# Patient Record
Sex: Female | Born: 1975 | Race: Black or African American | Hispanic: No | Smoking: Never smoker
Health system: Southern US, Community
[De-identification: ages and names within clinical notes are randomized; demographics above are authoritative.]

## PROBLEM LIST (undated history)

## (undated) DIAGNOSIS — K219 Gastro-esophageal reflux disease without esophagitis: Secondary | ICD-10-CM

## (undated) DIAGNOSIS — G935 Compression of brain: Secondary | ICD-10-CM

## (undated) HISTORY — PX: HERNIA REPAIR: SHX51

## (undated) HISTORY — PX: OTHER SURGICAL HISTORY: SHX169

---

## 2001-05-20 ENCOUNTER — Encounter: Payer: Self-pay | Admitting: Internal Medicine

## 2001-05-20 ENCOUNTER — Emergency Department (HOSPITAL_COMMUNITY): Admission: EM | Admit: 2001-05-20 | Discharge: 2001-05-20 | Payer: Self-pay | Admitting: Emergency Medicine

## 2002-01-06 ENCOUNTER — Encounter: Payer: Self-pay | Admitting: Internal Medicine

## 2002-01-06 ENCOUNTER — Ambulatory Visit (HOSPITAL_COMMUNITY): Admission: RE | Admit: 2002-01-06 | Discharge: 2002-01-06 | Payer: Self-pay | Admitting: Internal Medicine

## 2002-07-04 ENCOUNTER — Emergency Department (HOSPITAL_COMMUNITY): Admission: EM | Admit: 2002-07-04 | Discharge: 2002-07-04 | Payer: Self-pay | Admitting: *Deleted

## 2003-03-14 ENCOUNTER — Encounter: Payer: Self-pay | Admitting: *Deleted

## 2003-03-14 ENCOUNTER — Emergency Department (HOSPITAL_COMMUNITY): Admission: EM | Admit: 2003-03-14 | Discharge: 2003-03-14 | Payer: Self-pay | Admitting: *Deleted

## 2003-08-09 ENCOUNTER — Ambulatory Visit (HOSPITAL_COMMUNITY): Admission: RE | Admit: 2003-08-09 | Discharge: 2003-08-09 | Payer: Self-pay | Admitting: Internal Medicine

## 2003-08-09 ENCOUNTER — Encounter: Payer: Self-pay | Admitting: Internal Medicine

## 2004-05-07 ENCOUNTER — Emergency Department (HOSPITAL_COMMUNITY): Admission: EM | Admit: 2004-05-07 | Discharge: 2004-05-07 | Payer: Self-pay | Admitting: Emergency Medicine

## 2004-07-14 ENCOUNTER — Emergency Department (HOSPITAL_COMMUNITY): Admission: EM | Admit: 2004-07-14 | Discharge: 2004-07-14 | Payer: Self-pay | Admitting: Emergency Medicine

## 2005-03-17 ENCOUNTER — Emergency Department (HOSPITAL_COMMUNITY): Admission: EM | Admit: 2005-03-17 | Discharge: 2005-03-17 | Payer: Self-pay | Admitting: Emergency Medicine

## 2005-05-13 ENCOUNTER — Emergency Department (HOSPITAL_COMMUNITY): Admission: EM | Admit: 2005-05-13 | Discharge: 2005-05-13 | Payer: Self-pay | Admitting: Emergency Medicine

## 2011-11-06 ENCOUNTER — Emergency Department (HOSPITAL_COMMUNITY)
Admission: EM | Admit: 2011-11-06 | Discharge: 2011-11-06 | Disposition: A | Discharge status: Home / Self Care | Payer: Medicaid Other | Attending: Emergency Medicine | Admitting: Emergency Medicine

## 2011-11-06 ENCOUNTER — Other Ambulatory Visit: Payer: Self-pay

## 2011-11-06 ENCOUNTER — Emergency Department (HOSPITAL_COMMUNITY): Payer: Medicaid Other

## 2011-11-06 DIAGNOSIS — M4120 Other idiopathic scoliosis, site unspecified: Secondary | ICD-10-CM | POA: Insufficient documentation

## 2011-11-06 DIAGNOSIS — R071 Chest pain on breathing: Secondary | ICD-10-CM | POA: Insufficient documentation

## 2011-11-06 DIAGNOSIS — M62838 Other muscle spasm: Secondary | ICD-10-CM | POA: Insufficient documentation

## 2011-11-06 MED ORDER — CYCLOBENZAPRINE HCL 10 MG PO TABS: 10.0000 mg | ORAL_TABLET | Freq: Two times a day (BID) | ORAL | Status: AC | PRN

## 2011-11-06 MED ORDER — OXYCODONE-ACETAMINOPHEN 5-325 MG PO TABS
1.0000 | ORAL_TABLET | Freq: Once | ORAL | Status: AC
  Administered 2011-11-06: 1 via ORAL

## 2011-11-06 MED ORDER — OXYCODONE-ACETAMINOPHEN 5-325 MG PO TABS
1.0000 | ORAL_TABLET | Freq: Four times a day (QID) | ORAL | Status: AC | PRN
Start: 2011-11-06 — End: 2011-11-16

## 2011-11-06 NOTE — ED Provider Notes (Signed)
History     CSN: [id number] Arrival date & time: 11/06/2011 12:16 PM   First MD Initiated Contact with Patient 11/06/11 1305      Chief Complaint  Patient presents with  . Chest Pain    (Consider location/radiation/quality/duration/timing/severity/associated sxs/prior treatment) Patient is a 35 y.o. female presenting with chest pain. The history is provided by the patient.  Chest Pain The chest pain began yesterday. Chest pain occurs constantly. The chest pain is worsening. Associated with: worse with movement. At its most intense, the pain is at 10/10. The pain is currently at 10/10. The quality of the pain is described as pleuritic, sharp and stabbing. The pain radiates to the left shoulder and left arm (pain worsened by moving the left arm). Chest pain is worsened by certain positions. Pertinent negatives for primary symptoms include no fever, no shortness of breath, no cough, no palpitations, no abdominal pain, no nausea and no vomiting.  Pertinent negatives for associated symptoms include no numbness and no weakness. She tried NSAIDs for the symptoms. Risk factors include no known risk factors.     History reviewed. No pertinent past medical history.  History reviewed. No pertinent past surgical history.  No family history on file.  History  Substance Use Topics  . Smoking status: Not on file  . Smokeless tobacco: Not on file  . Alcohol Use: No    OB History    Grav Para Term Preterm Abortions TAB SAB Ect Mult Living                  Review of Systems  Constitutional: Negative for fever.  Respiratory: Negative for cough and shortness of breath.   Cardiovascular: Positive for chest pain. Negative for palpitations.  Gastrointestinal: Negative for nausea, vomiting and abdominal pain.  Neurological: Negative for weakness and numbness.  All other systems reviewed and are negative.    Allergies  Review of patient's allergies indicates no known allergies.  Home  Medications   Current Outpatient Rx  Name Route Sig Dispense Refill  . ACETAMINOPHEN 325 MG PO TABS Oral Take 650 mg by mouth every 6 (six) hours as needed. pain     . IBUPROFEN 200 MG PO TABS Oral Take 200 mg by mouth every 6 (six) hours as needed. For pain      BP 131/73  Pulse 74  Temp(Src) 97.8 F (36.6 C) (Oral)  Resp 16  Ht 5' 9.5" (1.765 m)  Wt 238 lb (107.956 kg)  BMI 34.64 kg/m2  SpO2 98%  LMP 10/30/2011  Physical Exam  Nursing note and vitals reviewed. Constitutional: She is oriented to person, place, and time. She appears well-developed and well-nourished. No distress.  HENT:  Head: Normocephalic and atraumatic.  Eyes: EOM are normal. Pupils are equal, round, and reactive to light.  Cardiovascular: Normal rate, regular rhythm, normal heart sounds and intact distal pulses.  Exam reveals no friction rub.   No murmur heard. Pulmonary/Chest: Effort normal and breath sounds normal. She has no wheezes. She has no rales. She exhibits tenderness.       Tenderness with muscle spasm over the left chest wall, left trapezius and left deltoid  Abdominal: Soft. Bowel sounds are normal. She exhibits no distension. There is no tenderness. There is no rebound and no guarding.  Musculoskeletal: Normal range of motion. She exhibits no tenderness.       Right shoulder: Normal.       Left shoulder: She exhibits tenderness and spasm.  No edema, strong pulses distally in upper ext  Neurological: She is alert and oriented to person, place, and time. No cranial nerve deficit.  Skin: Skin is warm and dry. No rash noted.  Psychiatric: She has a normal mood and affect. Her behavior is normal.    ED Course  Procedures (including critical care time)  Labs Reviewed - No data to display Dg Chest 2 View  11/06/2011  *RADIOLOGY REPORT*  Clinical Data: Left shoulder and chest pain  CHEST - 2 VIEW  Comparison: 05/13/2005  Findings: Biconvex thoracolumbar scoliosis. Upper-normal size of  cardiac silhouette. Mediastinal contours and pulmonary vascularity normal. Minimal chronic peribronchial thickening. No pulmonary infiltrate, pleural effusion or pneumothorax. No acute osseous findings.  IMPRESSION: Minimal chronic bronchitic changes. Scoliosis.  Original Report Authenticated By: Lollie Marrow, M.D.     No diagnosis found.   Date: 11/06/2011  Rate: 74  Rhythm: normal sinus rhythm  QRS Axis: normal  Intervals: normal  ST/T Wave abnormalities: normal  Conduction Disutrbances:none  Narrative Interpretation:   Old EKG Reviewed: none available    MDM   Pt with chest wall pain with pain worse with palpation and movement of the left arm.  No SOB, Cough, fever or neuro deficit.  Pt well appearing and normal VS.  Pt can move her arm but states it is so painful she does not want to move it.  EKG wnl.  Will get CXR, however PERC neg and do not feel that this is cardiac and no risk factors. Will treat pain and recheck.  3:00 PM Pt feeling better after pain meds and CXR with scoliosis but o/w wnl but may be the cause of the pain.  Will treat symptomatically.       Gwyneth Sprout, MD 11/06/11 1504

## 2011-11-06 NOTE — ED Notes (Signed)
Pt complain of pain in left chest. States she cannot move her left arm

## 2018-04-08 DIAGNOSIS — M170 Bilateral primary osteoarthritis of knee: Secondary | ICD-10-CM | POA: Insufficient documentation

## 2018-04-08 DIAGNOSIS — M5410 Radiculopathy, site unspecified: Secondary | ICD-10-CM | POA: Insufficient documentation

## 2020-02-13 ENCOUNTER — Other Ambulatory Visit: Payer: Self-pay | Admitting: Family Medicine

## 2020-02-13 DIAGNOSIS — M25552 Pain in left hip: Secondary | ICD-10-CM

## 2020-02-16 ENCOUNTER — Ambulatory Visit (HOSPITAL_COMMUNITY): Payer: Self-pay

## 2020-02-22 ENCOUNTER — Ambulatory Visit (HOSPITAL_COMMUNITY)
Admission: RE | Admit: 2020-02-22 | Discharge: 2020-02-22 | Disposition: A | Discharge status: Home / Self Care | Payer: BC Managed Care – PPO | Source: Ambulatory Visit | Attending: Family Medicine | Admitting: Family Medicine

## 2020-02-22 ENCOUNTER — Other Ambulatory Visit: Payer: Self-pay

## 2020-02-22 DIAGNOSIS — M25552 Pain in left hip: Secondary | ICD-10-CM | POA: Diagnosis not present

## 2020-02-28 ENCOUNTER — Ambulatory Visit: Payer: BC Managed Care – PPO | Attending: Internal Medicine

## 2020-02-28 DIAGNOSIS — Z23 Encounter for immunization: Secondary | ICD-10-CM | POA: Insufficient documentation

## 2020-02-28 NOTE — Progress Notes (Signed)
   Covid-19 Vaccination Clinic  Name:  Terri Wiley    MRN: [id number] DOB: August 15, 1976  02/28/2020  Terri Wiley was observed post Covid-19 immunization for 15 minutes without incident. She was provided with Vaccine Information Sheet and instruction to access the V-Safe system.   Terri Wiley was instructed to call 911 with any severe reactions post vaccine: Marland Kitchen Difficulty breathing  . Swelling of face and throat  . A fast heartbeat  . A bad rash all over body  . Dizziness and weakness   Immunizations Administered    Name Date Dose VIS Date Route   Moderna COVID-19 Vaccine 02/28/2020  3:25 AM 0.5 mL 11/29/2019 Intramuscular   Manufacturer: Moderna   Lot: [id number]   [place]: [id number]

## 2020-03-27 ENCOUNTER — Ambulatory Visit: Payer: BC Managed Care – PPO | Attending: Internal Medicine

## 2020-03-27 DIAGNOSIS — Z23 Encounter for immunization: Secondary | ICD-10-CM

## 2020-03-27 NOTE — Progress Notes (Signed)
   Covid-19 Vaccination Clinic  Name:  Terri Wiley    MRN: [id number] DOB: 14-Dec-1976  03/27/2020  [name] was observed post Covid-19 immunization for 15 minutes without incident. She was provided with Vaccine Information Sheet and instruction to access the V-Safe system.   [name] was instructed to call 911 with any severe reactions post vaccine: Marland Kitchen Difficulty breathing  . Swelling of face and throat  . A fast heartbeat  . A bad rash all over body  . Dizziness and weakness   Immunizations Administered    Name Date Dose VIS Date Route   Moderna COVID-19 Vaccine 03/27/2020  2:40 PM 0.5 mL 11/29/2019 Intramuscular   Manufacturer: Moderna   Lot: [id number]   [place]: [id number]

## 2020-10-02 ENCOUNTER — Other Ambulatory Visit: Payer: Self-pay

## 2020-10-02 ENCOUNTER — Emergency Department (HOSPITAL_COMMUNITY): Payer: BC Managed Care – PPO

## 2020-10-02 ENCOUNTER — Encounter (HOSPITAL_COMMUNITY): Payer: Self-pay | Admitting: Emergency Medicine

## 2020-10-02 ENCOUNTER — Emergency Department (HOSPITAL_COMMUNITY)
Admission: EM | Admit: 2020-10-02 | Discharge: 2020-10-02 | Disposition: A | Discharge status: Home / Self Care | Payer: BC Managed Care – PPO | Attending: Emergency Medicine | Admitting: Emergency Medicine

## 2020-10-02 DIAGNOSIS — K219 Gastro-esophageal reflux disease without esophagitis: Secondary | ICD-10-CM | POA: Insufficient documentation

## 2020-10-02 DIAGNOSIS — D649 Anemia, unspecified: Secondary | ICD-10-CM | POA: Diagnosis not present

## 2020-10-02 DIAGNOSIS — M549 Dorsalgia, unspecified: Secondary | ICD-10-CM | POA: Insufficient documentation

## 2020-10-02 DIAGNOSIS — M25519 Pain in unspecified shoulder: Secondary | ICD-10-CM | POA: Insufficient documentation

## 2020-10-02 DIAGNOSIS — Z7982 Long term (current) use of aspirin: Secondary | ICD-10-CM | POA: Insufficient documentation

## 2020-10-02 DIAGNOSIS — R202 Paresthesia of skin: Secondary | ICD-10-CM | POA: Insufficient documentation

## 2020-10-02 DIAGNOSIS — R079 Chest pain, unspecified: Secondary | ICD-10-CM

## 2020-10-02 DIAGNOSIS — R200 Anesthesia of skin: Secondary | ICD-10-CM

## 2020-10-02 HISTORY — DX: Compression of brain: G93.5

## 2020-10-02 HISTORY — DX: Gastro-esophageal reflux disease without esophagitis: K21.9

## 2020-10-02 LAB — CBC WITH DIFFERENTIAL/PLATELET
Abs Immature Granulocytes: 0.01 10*3/uL (ref 0.00–0.07)
Basophils Absolute: 0 10*3/uL (ref 0.0–0.1)
Basophils Relative: 0 %
Eosinophils Absolute: 0.1 10*3/uL (ref 0.0–0.5)
Eosinophils Relative: 1 %
HCT: 30.5 % — ABNORMAL LOW (ref 36.0–46.0)
Hemoglobin: 10 g/dL — ABNORMAL LOW (ref 12.0–15.0)
Immature Granulocytes: 0 %
Lymphocytes Relative: 31 %
Lymphs Abs: 1.9 10*3/uL (ref 0.7–4.0)
MCH: 28.9 pg (ref 26.0–34.0)
MCHC: 32.8 g/dL (ref 30.0–36.0)
MCV: 88.2 fL (ref 80.0–100.0)
Monocytes Absolute: 0.4 10*3/uL (ref 0.1–1.0)
Monocytes Relative: 7 %
Neutro Abs: 3.7 10*3/uL (ref 1.7–7.7)
Neutrophils Relative %: 61 %
Platelets: 251 10*3/uL (ref 150–400)
RBC: 3.46 MIL/uL — ABNORMAL LOW (ref 3.87–5.11)
RDW: 14.2 % (ref 11.5–15.5)
WBC: 6.2 10*3/uL (ref 4.0–10.5)
nRBC: 0 % (ref 0.0–0.2)

## 2020-10-02 LAB — COMPREHENSIVE METABOLIC PANEL
ALT: 14 U/L (ref 0–44)
AST: 13 U/L — ABNORMAL LOW (ref 15–41)
Albumin: 3.1 g/dL — ABNORMAL LOW (ref 3.5–5.0)
Alkaline Phosphatase: 67 U/L (ref 38–126)
Anion gap: 6 (ref 5–15)
BUN: 11 mg/dL (ref 6–20)
CO2: 26 mmol/L (ref 22–32)
Calcium: 8.5 mg/dL — ABNORMAL LOW (ref 8.9–10.3)
Chloride: 108 mmol/L (ref 98–111)
Creatinine, Ser: 0.7 mg/dL (ref 0.44–1.00)
GFR calc non Af Amer: >60 mL/min (ref >60)
Glucose, Bld: 90 mg/dL (ref 70–99)
Potassium: 3.4 mmol/L — ABNORMAL LOW (ref 3.5–5.1)
Sodium: 140 mmol/L (ref 135–145)
Total Bilirubin: 0.3 mg/dL (ref 0.3–1.2)
Total Protein: 6.7 g/dL (ref 6.5–8.1)

## 2020-10-02 LAB — LIPASE, BLOOD: Lipase: 31 U/L (ref 11–51)

## 2020-10-02 LAB — D-DIMER, QUANTITATIVE: D-Dimer, Quant: 1.56 ug/mL-FEU — ABNORMAL HIGH (ref 0.00–0.50)

## 2020-10-02 LAB — TROPONIN I (HIGH SENSITIVITY): Troponin I (High Sensitivity): <2 ng/L (ref <18)

## 2020-10-02 MED ORDER — IOHEXOL 350 MG/ML SOLN
100.0000 mL | Freq: Once | INTRAVENOUS | Status: AC | PRN
  Administered 2020-10-02: 100 mL via INTRAVENOUS

## 2020-10-02 MED ORDER — PREDNISONE 10 MG PO TABS: 20.0000 mg | ORAL_TABLET | Freq: Every day | ORAL | 0 refills | Status: DC

## 2020-10-02 MED ORDER — DEXAMETHASONE SODIUM PHOSPHATE 10 MG/ML IJ SOLN
10.0000 mg | Freq: Once | INTRAMUSCULAR | Status: AC
  Administered 2020-10-02: 10 mg via INTRAVENOUS
  Filled 2020-10-02: qty 1

## 2020-10-02 MED ORDER — ASPIRIN 81 MG PO CHEW
324.0000 mg | CHEWABLE_TABLET | Freq: Once | ORAL | Status: AC
  Administered 2020-10-02: 324 mg via ORAL
  Filled 2020-10-02: qty 4

## 2020-10-02 NOTE — ED Triage Notes (Signed)
Pt has chest pain that began last night with nausea. Pt states the pain is in her left chest and radiates to her left shoulder.

## 2020-10-02 NOTE — ED Provider Notes (Signed)
[facility] EMERGENCY DEPARTMENT Provider Note   CSN: [id number] Arrival date & time: 10/02/20  1631     History Chief Complaint  Patient presents with  . Chest Pain    Terri Wiley is a 44 y.o. female.  HPI   This very pleasant 44 year old female with a history of acid reflux presents with a complaint of some chest pain on the left side associated with pain going up into the arm and the shoulder and the neck, this gets worse with movement of the arm, it is persistent, feels like she has a bubble in her chest, she tried to drink some soda to make herself belch but this did not work. She does have a grandmother who recently had a blood clot in her lungs but has never had risk factors herself, she does not smoke, no recent travel trauma immobilization or surgery and she does not have a history of cancer or estrogen use. She has no shortness of breath, she has been coughing occasionally, she has no swelling of the legs. The symptoms started last night but been present the majority of the day. She feels like she has some numbness in her left hand and forearm, it does not extend above the elbow  Past Medical History:  Diagnosis Date  . GERD (gastroesophageal reflux disease)   . Hernia cerebri ([place])     There are no problems to display for this patient.   Past Surgical History:  Procedure Laterality Date  . tubiligation       OB History   No obstetric history on file.     No family history on file.  Social History   Tobacco Use  . Smoking status: Never Smoker  . Smokeless tobacco: Never Used  Vaping Use  . Vaping Use: Never used  Substance Use Topics  . Alcohol use: No  . Drug use: No    Home Medications Prior to Admission medications   Medication Sig Start Date End Date Taking? Authorizing Provider  acetaminophen (TYLENOL) 325 MG tablet Take 650 mg by mouth every 6 (six) hours as needed. pain    Yes [provider]  aspirin 81 MG EC tablet Take 81 mg by  mouth daily. Swallow whole.   Yes [provider]  ibuprofen (ADVIL,MOTRIN) 200 MG tablet Take 200 mg by mouth every 6 (six) hours as needed. For pain   Yes [provider]  predniSONE (DELTASONE) 10 MG tablet Take 2 tablets (20 mg total) by mouth daily. 10/02/20   Noemi Chapel, MD    Allergies    Bee venom and Penicillins  Review of Systems   Review of Systems  All other systems reviewed and are negative.   Physical Exam Updated Vital Signs BP (!) 126/94   Pulse 71   Temp 99.2 F (37.3 C) (Oral)   Resp (!) 22   Ht 1.753 m (5' 9" )   Wt 132.9 kg   LMP 09/23/2020 (Exact Date)   SpO2 96%   BMI 43.27 kg/m   Physical Exam Vitals and nursing note reviewed.  Constitutional:      General: She is not in acute distress.    Appearance: She is well-developed.  HENT:     Head: Normocephalic and atraumatic.     Mouth/Throat:     Pharynx: No oropharyngeal exudate.  Eyes:     General: No scleral icterus.       Right eye: No discharge.        Left eye: No  discharge.     Conjunctiva/sclera: Conjunctivae normal.     Pupils: Pupils are equal, round, and reactive to light.  Neck:     Thyroid: No thyromegaly.     Vascular: No JVD.  Cardiovascular:     Rate and Rhythm: Normal rate and regular rhythm.     Heart sounds: Normal heart sounds. No murmur heard.  No friction rub. No gallop.      Comments: Occasional PVCs Pulmonary:     Effort: Pulmonary effort is normal. No respiratory distress.     Breath sounds: Normal breath sounds. No wheezing or rales.  Abdominal:     General: Bowel sounds are normal. There is no distension.     Palpations: Abdomen is soft. There is no mass.     Tenderness: There is no abdominal tenderness.  Musculoskeletal:        General: No tenderness. Normal range of motion.     Cervical back: Normal range of motion and neck supple.  Lymphadenopathy:     Cervical: No cervical adenopathy.  Skin:    General: Skin is warm and dry.      Findings: No erythema or rash.  Neurological:     Mental Status: She is alert.     Coordination: Coordination normal.     Comments: Normal strength in all 4 extremities, normal finger-nose-finger bilaterally, no pronator drift. She has decreased sensation to light touch and pinprick from the elbow to the fingertips on the left. There is no decrease sensation in the legs and no weakness in the legs. There is no facial droop, cranial nerves III through XII are normal  Psychiatric:        Behavior: Behavior normal.     ED Results / Procedures / Treatments   Labs (all labs ordered are listed, but only abnormal results are displayed) Labs Reviewed  CBC WITH DIFFERENTIAL/PLATELET - Abnormal; Notable for the following components:      Result Value   RBC 3.46 (*)    Hemoglobin 10.0 (*)    HCT 30.5 (*)    All other components within normal limits  COMPREHENSIVE METABOLIC PANEL - Abnormal; Notable for the following components:   Potassium 3.4 (*)    Calcium 8.5 (*)    Albumin 3.1 (*)    AST 13 (*)    All other components within normal limits  D-DIMER, QUANTITATIVE (NOT AT [facility]) - Abnormal; Notable for the following components:   D-Dimer, Quant 1.56 (*)    All other components within normal limits  LIPASE, BLOOD  TROPONIN I (HIGH SENSITIVITY)    EKG EKG Interpretation  Date/Time:  Tuesday October 02 2020 16:39:29 EDT Ventricular Rate:  76 PR Interval:  164 QRS Duration: 82 QT Interval:  410 QTC Calculation: 461 R Axis:   61 Text Interpretation: Sinus rhythm with frequent Premature ventricular complexes Low voltage QRS Borderline ECG Since last tracing ectopy now present Confirmed by Noemi Chapel [phone]) on 10/02/2020 5:04:12 PM   Radiology DG Chest 2 View  Result Date: 10/02/2020 CLINICAL DATA:  Chest pain and nausea since last evening. EXAM: CHEST - 2 VIEW COMPARISON:  11/06/2011 FINDINGS: The heart is upper limits of normal in size. Mild tortuosity of the thoracic aorta. A  small to moderate-sized hiatal hernia is noted. The lungs are clear. No infiltrates or effusions. No pulmonary lesions. The bony thorax is intact. Stable thoracolumbar scoliosis. IMPRESSION: No active cardiopulmonary disease. Electronically Signed   By: Marijo Sanes M.D.   On: 10/02/2020 17:52  CT Angio Chest PE W and/or Wo Contrast  Result Date: 10/02/2020 CLINICAL DATA:  Chest pain left chest pain EXAM: CT ANGIOGRAPHY CHEST WITH CONTRAST TECHNIQUE: Multidetector CT imaging of the chest was performed using the standard protocol during bolus administration of intravenous contrast. Multiplanar CT image reconstructions and MIPs were obtained to evaluate the vascular anatomy. CONTRAST:  162m OMNIPAQUE IOHEXOL 350 MG/ML SOLN COMPARISON:  Chest x-ray 10/02/2020, CT chest 07/14/2004 FINDINGS: Cardiovascular: Satisfactory opacification of the pulmonary arteries to the segmental level. No evidence of pulmonary embolism. Borderline to mild cardiomegaly. No pericardial effusion. Nonaneurysmal aorta. No dissection is seen. Mediastinum/Nodes: No enlarged mediastinal, hilar, or axillary lymph nodes. Thyroid gland, trachea, and esophagus demonstrate no significant findings. Lungs/Pleura: Lungs are clear. No pleural effusion or pneumothorax. Upper Abdomen: Moderate hiatal hernia. Probable parapelvic cyst in the right kidney. Musculoskeletal: No chest wall abnormality. No acute or significant osseous findings. Review of the MIP images confirms the above findings. IMPRESSION: 1. Negative for acute pulmonary embolus or aortic dissection. Clear lung fields. 2. Moderate hiatal hernia. Electronically Signed   By: KDonavan FoilM.D.   On: 10/02/2020 19:59    Procedures Procedures (including critical care time)  Medications Ordered in ED Medications  aspirin chewable tablet 324 mg (324 mg Oral Given 10/02/20 1804)  dexamethasone (DECADRON) injection 10 mg (10 mg Intravenous Given 10/02/20 1806)  iohexol (OMNIPAQUE) 350  MG/ML injection 100 mL (100 mLs Intravenous Contrast Given 10/02/20 1939)    ED Course  I have reviewed the triage vital signs and the nursing notes.  Pertinent labs & imaging results that were available during my care of the patient were reviewed by me and considered in my medical decision making (see chart for details).    MDM Rules/Calculators/A&P                          The cause of the patient's chest pain could be multifactorial however the patient has very low risk for cardiac disease and pulmonary embolism. She has recently had a family member with a PE thus we will add a D-dimer, she is not tachycardic or hypoxic and her lung sounds are clear, order a chest x-ray. We will check a troponin, a single value should be adequate since this started last night. She could potentially have some acid reflux type symptoms however the numbness in her arm with the neck pain could suggest a radiculopathy and thus Decadron will be ordered. There is no muscle weakness in that arm whatsoever. No other nerve deficits to suggest that this is a stroke  The patient has had a negative CT scan for dissection or pulmonary embolism, her chest pain with neurologic symptoms is more likely to be related to a pinched nerve that it is to be related to any of the more serious complicating mechanisms such as dissection or PE.  Patient is stable for discharge, labs are otherwise unremarkable, mild anemia - pt made aware for follow up  Final Clinical Impression(s) / ED Diagnoses Final diagnoses:  Chest pain, unspecified type  Arm numbness  Anemia, unspecified type    Rx / DC Orders ED Discharge Orders         Ordered    predniSONE (DELTASONE) 10 MG tablet  Daily        10/02/20 2012           MNoemi Chapel MD 10/02/20 2015

## 2020-10-02 NOTE — Discharge Instructions (Addendum)
Your testing has been totally normal with no signs of heart attack, no signs of blood clot or any other significant abnormalities.  You do have some anemia - low blood counts which needs to be rechecked in a month at your family doctor office).  You may have a pinched nerve that is causing your pain so I want you to take prednisone daily for the next 5 days.  Please follow-up with your family doctor for further testing if this continues but come back to the ER for severe or worsening symptoms

## 2021-02-06 ENCOUNTER — Other Ambulatory Visit (HOSPITAL_COMMUNITY): Payer: Self-pay | Admitting: Family Medicine

## 2021-02-13 ENCOUNTER — Ambulatory Visit (HOSPITAL_COMMUNITY)
Admission: RE | Admit: 2021-02-13 | Discharge: 2021-02-13 | Disposition: A | Discharge status: Home / Self Care | Payer: BC Managed Care – PPO | Source: Ambulatory Visit | Attending: Family Medicine | Admitting: Family Medicine

## 2021-02-13 ENCOUNTER — Other Ambulatory Visit: Payer: Self-pay

## 2021-02-13 ENCOUNTER — Other Ambulatory Visit (HOSPITAL_COMMUNITY): Payer: Self-pay | Admitting: Family Medicine

## 2021-02-13 DIAGNOSIS — M549 Dorsalgia, unspecified: Secondary | ICD-10-CM | POA: Insufficient documentation

## 2021-02-13 NOTE — Progress Notes (Signed)
CARDIOLOGY CONSULT NOTE       Patient ID: Terri Wiley MRN: [id number] DOB/AGE: Jan 29, 1976 45 y.o.  Referring Physician: Karie Kirks Primary Physician: Leslie Andrea, MD Primary Cardiologist: New Reason for Consultation: Chest Pain   Active Problems:   * No active hospital problems. *   HPI:  45 y.o. referred by Dr Karie Kirks for chest pain History of GERD Seen in ED 10/02/20 with left sided chest pain going up arm, shoulder and neck Pain worse with movement of left arm Also with  Gas felt like she needed to belch Symptoms for 24 hours There was some numbness in her left arm CT negative for PE/Dissection Troponin negative and no acute ECG changes Also noted to be anemic with Hb 10 normal MCV Concern for radiculopathy and given some steroids. She had f/u plain films of spine 02/13/21 normal  Has thoracolumbar scoliosis  Pain started in October. Atypical not always exertional sharp on both sided of chest Can occur 1-2/x week or qow. 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 Has 5 kids 3 at home Youngest just went to boot camp at [employer] in [place]. She has hiatal hernia with indigestion   She had a 1/2 niece who died waiting for heart transplant for weak heart muscle   ROS All other systems reviewed and negative except as noted above  Past Medical History:  Diagnosis Date  . GERD (gastroesophageal reflux disease)   . Hernia cerebri ([place])     Family History  Problem Relation Age of Onset  . Liver disease Mother   . Hypertension Mother   . Diabetes Mother   . Aneurysm Father   . Hypertension Father     Social History   Socioeconomic History  . Marital status: Legally Separated    Spouse name: Not on file  . Number of children: Not on file  . Years of education: Not on file  . Highest education level: Not on file  Occupational History  . Not on file  Tobacco Use  . Smoking status: Never Smoker  . Smokeless tobacco: Never Used  Vaping Use  .  Vaping Use: Never used  Substance and Sexual Activity  . Alcohol use: No  . Drug use: No  . Sexual activity: Not on file  Other Topics Concern  . Not on file  Social History Narrative  . Not on file   Social Determinants of Health   Financial Resource Strain: Not on file  Food Insecurity: Not on file  Transportation Needs: Not on file  Physical Activity: Not on file  Stress: Not on file  Social Connections: Not on file  Intimate Partner Violence: Not on file    Past Surgical History:  Procedure Laterality Date  . HERNIA REPAIR    . tubiligation        Current Outpatient Medications:  .  acetaminophen (TYLENOL) 325 MG tablet, Take 650 mg by mouth every 6 (six) hours as needed. pain, Disp: , Rfl:  .  aspirin 81 MG EC tablet, Take 81 mg by mouth daily. Swallow whole., Disp: , Rfl:  .  cefdinir (OMNICEF) 300 MG capsule, Take 300 mg by mouth 2 (two) times daily., Disp: , Rfl:  .  cetirizine (ZYRTEC) 10 MG tablet, Take 10 mg by mouth daily., Disp: , Rfl:  .  FEROSUL 325 (65 Fe) MG tablet, Take 325 mg by mouth daily., Disp: , Rfl:  .  ibuprofen (ADVIL,MOTRIN) 200 MG tablet, Take 200 mg by mouth every 6 (  six) hours as needed. For pain, Disp: , Rfl:  .  LORazepam (ATIVAN) 0.5 MG tablet, Take 0.5 mg by mouth daily as needed., Disp: , Rfl:     Physical Exam: Blood pressure (!) 142/80, pulse 99, height 5' 9.5" (1.765 m), weight 124.5 kg, SpO2 96 %.    Affect appropriate Healthy:  appears stated age 47: normal Neck supple with no adenopathy JVP normal no bruits no thyromegaly Lungs clear with no wheezing and good diaphragmatic motion Heart:  S1/S2 no murmur, no rub, gallop or click PMI normal Abdomen: benighn, BS positve, no tenderness, no AAA no bruit.  No HSM or HJR Distal pulses intact with no bruits No edema Neuro non-focal Skin warm and dry No muscular weakness   Labs:   Lab Results  Component Value Date   WBC 6.2 10/02/2020   HGB 10.0 (L) 10/02/2020   HCT  30.5 (L) 10/02/2020   MCV 88.2 10/02/2020   PLT 251 10/02/2020   Radiology: DG Lumbar Spine Complete  Result Date: 02/15/2021 CLINICAL DATA:  Pain EXAM: LUMBAR SPINE - COMPLETE 4+ VIEW COMPARISON:  April 21, 2018 FINDINGS: There are five non-rib bearing lumbar-type vertebral bodies. No spondylolisthesis. Partial visualization of dextrocurvature centered at the thoracolumbar junction. There is no evidence for acute fracture or subluxation. Mild multilevel endplate proliferative changes with relative preservation of the disc spaces. Mild lower lumbar facet arthropathy. Visualized abdomen is unremarkable. IMPRESSION: 1. No acute osseous abnormality in the lumbar spine. Electronically Signed   By: Valentino Saxon MD   On: 02/15/2021 16:06   DG SCOLIOSIS EVAL COMPLETE SPINE 1 VIEW  Result Date: 02/15/2021 CLINICAL DATA:  Pain EXAM: DG SCOLIOSIS EVAL COMPLETE SPINE 1V COMPARISON:  October 02, 2028 FINDINGS: There are 12 rib-bearing thoracic type vertebral bodies and 5 non rib-bearing lumbar type vertebral bodies. There is levocurvature the superior thoracic spine with a Cobb angle of 26 degrees as measured from the superior endplate of T1 to the superior endplate of T7. There is dextrocurvature the thoracolumbar spine with a Cobb angle of 28 degrees as measured from the superior endplate of T7 to the superior endplate of L3. Revisualization of a moderate hiatal hernia. Unchanged cardiomediastinal silhouette. No acute osseous abnormality. IMPRESSION: 1. Thoracolumbar scoliosis as described above. Electronically Signed   By: Valentino Saxon MD   On: 02/15/2021 16:04    EKG: NSR PVC nonspecific ST changes    ASSESSMENT AND PLAN:   1. Chest pain: atypical no acute ECG changes troponin negative after 24 hours symptoms Given nonspecific ECG changes and ambient PVCls favor exercise myovue and echo to further evaluate 2. Anemia:  F/u primary not microcytic  3. Arm pain Lumbar/spine films done 02/13/21  normal scoliosis likely contributes to pains   Ex Myovue Echo  F/U PRN if normal   Signed: Jenkins Rouge 02/18/2021, 9:37 AM

## 2021-02-18 ENCOUNTER — Encounter: Payer: Self-pay | Admitting: Cardiovascular Disease

## 2021-02-18 ENCOUNTER — Ambulatory Visit: Payer: BC Managed Care – PPO | Admitting: Cardiovascular Disease

## 2021-02-18 ENCOUNTER — Encounter: Payer: Self-pay | Admitting: *Deleted

## 2021-02-18 ENCOUNTER — Other Ambulatory Visit: Payer: Self-pay

## 2021-02-18 VITALS — BP 142/80 | HR 99 | Ht 69.5 in | Wt 274.4 lb

## 2021-02-18 DIAGNOSIS — R079 Chest pain, unspecified: Secondary | ICD-10-CM | POA: Diagnosis not present

## 2021-02-18 DIAGNOSIS — I493 Ventricular premature depolarization: Secondary | ICD-10-CM

## 2021-02-18 DIAGNOSIS — R9431 Abnormal electrocardiogram [ECG] [EKG]: Secondary | ICD-10-CM | POA: Diagnosis not present

## 2021-02-18 NOTE — Addendum Note (Signed)
Addended by: Christella Scheuermann C on: 02/18/2021 10:16 AM   Modules accepted: Orders

## 2021-02-18 NOTE — Patient Instructions (Signed)
Medication Instructions:  Your physician recommends that you continue on your current medications as directed. Please refer to the Current Medication list given to you today.  *If you need a refill on your cardiac medications before your next appointment, please call your pharmacy*   Lab Work: NONE   If you have labs (blood work) drawn today and your tests are completely normal, you will receive your results only by: Marland Kitchen MyChart Message (if you have MyChart) OR . A paper copy in the mail If you have any lab test that is abnormal or we need to change your treatment, we will call you to review the results.   Testing/Procedures: Your physician has requested that you have an echocardiogram. Echocardiography is a painless test that uses sound waves to create images of your heart. It provides your doctor with information about the size and shape of your heart and how well your heart's chambers and valves are working. This procedure takes approximately one hour. There are no restrictions for this procedure.  Your physician has requested that you have en exercise stress myoview. For further information please visit [email or web address]. Please follow instruction sheet, as given.  Follow-Up: At [facility], you and your health needs are our priority.  As part of our continuing mission to provide you with exceptional heart care, we have created designated Provider Care Teams.  These Care Teams include your primary Cardiologist (physician) and Advanced Practice Providers (APPs -  Physician Assistants and Nurse Practitioners) who all work together to provide you with the care you need, when you need it.  We recommend signing up for the patient portal called "MyChart".  Sign up information is provided on this After Visit Summary.  MyChart is used to connect with patients for Virtual Visits (Telemedicine).  Patients are able to view lab/test results, encounter notes, upcoming appointments, etc.  Non-urgent  messages can be sent to your provider as well.   To learn more about what you can do with MyChart, go to [email or web address].    Your next appointment:    As Needed   The format for your next appointment:   In Person  Provider:   Jenkins Rouge, MD   Other Instructions Thank you for choosing [facility]!

## 2021-02-28 ENCOUNTER — Ambulatory Visit: Payer: BC Managed Care – PPO | Admitting: General Surgery

## 2021-02-28 ENCOUNTER — Encounter: Payer: Self-pay | Admitting: General Surgery

## 2021-02-28 ENCOUNTER — Other Ambulatory Visit: Payer: Self-pay

## 2021-02-28 VITALS — BP 112/71 | HR 66 | Temp 97.8°F | Resp 14 | Ht 69.5 in | Wt 273.0 lb

## 2021-02-28 DIAGNOSIS — M25552 Pain in left hip: Secondary | ICD-10-CM

## 2021-02-28 DIAGNOSIS — G8929 Other chronic pain: Secondary | ICD-10-CM | POA: Insufficient documentation

## 2021-02-28 DIAGNOSIS — R2242 Localized swelling, mass and lump, left lower limb: Secondary | ICD-10-CM | POA: Diagnosis not present

## 2021-02-28 NOTE — Progress Notes (Signed)
[facility] History and Physical  Reason for Referral: Left hip mass? Lipoma  Referring Physician:  Leslie Andrea, MD   Chief Complaint    Lipoma left thigh      Terri Wiley is a 45 y.o. female.  HPI: 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 She has left hip pain and notes a mass in the left hip. The pain is worse with her activity and driving.   She has been seen by her PCP who questions a lipoma. She has had prior US that did not demonstrate any mass and felt that the hips were symmetrical with regards to fatty tissue.   She continues to have this pain. She says the pain is at the hip and down the side. She has never been evaluated for any bursitis or sciatica she says.   Past Medical History:  Diagnosis Date  . GERD (gastroesophageal reflux disease)   . Hernia cerebri [facility])     Past Surgical History:  Procedure Laterality Date  . HERNIA REPAIR    . tubiligation      Family History  Problem Relation Age of Onset  . Liver disease Mother   . Hypertension Mother   . Diabetes Mother   . Aneurysm Father   . Hypertension Father     Social History   Tobacco Use  . Smoking status: Never Smoker  . Smokeless tobacco: Never Used  Vaping Use  . Vaping Use: Never used  Substance Use Topics  . Alcohol use: No  . Drug use: No    Medications: I have reviewed the patient's current medications. Allergies as of 02/28/2021      Reactions   Bee Venom Shortness Of Breath   Penicillins Hives      Medication List       Accurate as of February 28, 2021  4:54 PM. If you have any questions, ask your nurse or doctor.        acetaminophen 325 MG tablet Commonly known as: TYLENOL Take 650 mg by mouth every 6 (six) hours as needed. pain   aspirin 81 MG EC tablet Take 81 mg by mouth daily. Swallow whole.   cefdinir 300 MG capsule Commonly known as: OMNICEF Take 300 mg by mouth 2 (two) times daily.    cetirizine 10 MG tablet Commonly known as: ZYRTEC Take 10 mg by mouth daily.   FeroSul 325 (65 FE) MG tablet Generic drug: ferrous sulfate Take 325 mg by mouth daily.   ibuprofen 200 MG tablet Commonly known as: ADVIL Take 200 mg by mouth every 6 (six) hours as needed. For pain   LORazepam 0.5 MG tablet Commonly known as: ATIVAN Take 0.5 mg by mouth daily as needed.        ROS:  A comprehensive review of systems was negative except for: Cardiovascular: positive for chest pain Gastrointestinal: positive for abdominal pain Musculoskeletal: positive for back pain, neck pain and joint pain, scoliosis  Neurological: positive for numbness in bilateral lower extremities  Blood pressure 112/71, pulse 66, temperature 97.8 F (36.6 C), temperature source Other (Comment), resp. rate 14, height 5' 9.5" (1.765 m), weight 273 lb (123.8 kg), SpO2 98 %. Physical Exam Vitals reviewed.  Constitutional:      Appearance: She is obese.  HENT:     Head: Normocephalic.     Nose: Nose normal.  Eyes:     Extraocular Movements: Extraocular movements intact.  Cardiovascular:     Rate and Rhythm: Normal rate.  Pulmonary:     Effort: Pulmonary effort is normal.  Abdominal:     General: There is no distension.     Palpations: Abdomen is soft.     Tenderness: There is no abdominal tenderness.  Musculoskeletal:        General: No swelling.     Cervical back: No rigidity.     Comments: Left hip with fatty protrusion under the trochanter area, tender around hip, similar fatty protrusion on right but without tenderness   Skin:    General: Skin is warm.  Neurological:     General: No focal deficit present.     Mental Status: She is alert and oriented to person, place, and time.  Psychiatric:        Mood and Affect: Mood normal.        Behavior: Behavior normal.        Thought Content: Thought content normal.     Results: CLINICAL DATA:  Pain  EXAM: DG SCOLIOSIS EVAL COMPLETE SPINE  1V  COMPARISON:  October 02, 2028  FINDINGS: There are 12 rib-bearing thoracic type vertebral bodies and 5 non rib-bearing lumbar type vertebral bodies. There is levocurvature the superior thoracic spine with a Cobb angle of 26 degrees as measured from the superior endplate of T1 to the superior endplate of T7. There is dextrocurvature the thoracolumbar spine with a Cobb angle of 28 degrees as measured from the superior endplate of T7 to the superior endplate of L3. Revisualization of a moderate hiatal hernia. Unchanged cardiomediastinal silhouette. No acute osseous abnormality.  IMPRESSION: 1. Thoracolumbar scoliosis as described above.   Electronically Signed   By: Valentino Saxon MD   On: 02/15/2021 16:04  CLINICAL DATA:  Left hip pain.  Lump over left hip.  EXAM: ULTRASOUND LEFT LOWER EXTREMITY LIMITED  TECHNIQUE: Ultrasound examination of the lower extremity soft tissues was performed in the area of clinical concern.  COMPARISON:  CT report 05/20/2021.  FINDINGS: No cystic or solid abnormalities identified. Left hip soft tissues are symmetric with right side.  IMPRESSION: No abnormality identified. No cystic or solid abnormalities identified.   Electronically Signed   By: Marcello Moores  Register   On: 02/23/2020 06:07  Assessment & Plan:  [name] is a 45 y.o. female with chronic left hip pain and concern she has a lipoma. I do not see any overt differences in the left and right hip. She is more tender on the left for sure and uses this for driving. I am not what is causing her pain. Possibly something related to the hip versus spine given her scoliosis.   - Discussed ordering CT to look at the hip better and ensure no lipoma in the area. If there is signs of a lipoma or asymmetry we can discuss options of excision.  - Will order CT pelvis and see if insurance will cover - Will call with results    All questions were answered to the satisfaction  of the patient.   Virl Cagey 02/28/2021, 4:54 PM

## 2021-02-28 NOTE — Patient Instructions (Signed)
Will get CT and call with results.   Hip Pain The hip is the joint between the upper legs and the lower pelvis. The bones, cartilage, tendons, and muscles of your hip joint support your body and allow you to move around. Hip pain can range from a minor ache to severe pain in one or both of your hips. The pain may be felt on the inside of the hip joint near the groin, or on the outside near the buttocks and upper thigh. You may also have swelling or stiffness in your hip area. Follow these instructions at home: Managing pain, stiffness, and swelling  If directed, put ice on the painful area. To do this: ? Put ice in a plastic bag. ? Place a towel between your skin and the bag. ? Leave the ice on for 20 minutes, 2-3 times a day.  If directed, apply heat to the affected area as often as told by your health care provider. Use the heat source that your health care provider recommends, such as a moist heat pack or a heating pad. ? Place a towel between your skin and the heat source. ? Leave the heat on for 20-30 minutes. ? Remove the heat if your skin turns bright red. This is especially important if you are unable to feel pain, heat, or cold. You may have a greater risk of getting burned.      Activity  Do exercises as told by your health care provider.  Avoid activities that cause pain. General instructions  Take over-the-counter and prescription medicines only as told by your health care provider.  Keep a journal of your symptoms. Write down: ? How often you have hip pain. ? The location of your pain. ? What the pain feels like. ? What makes the pain worse.  Sleep with a pillow between your legs on your most comfortable side.  Keep all follow-up visits as told by your health care provider. This is important.   Contact a health care provider if:  You cannot put weight on your leg.  Your pain or swelling continues or gets worse after one week.  It gets harder to walk.  You  have a fever. Get help right away if:  You fall.  You have a sudden increase in pain and swelling in your hip.  Your hip is red or swollen or very tender to touch. Summary  Hip pain can range from a minor ache to severe pain in one or both of your hips.  The pain may be felt on the inside of the hip joint near the groin, or on the outside near the buttocks and upper thigh.  Avoid activities that cause pain.  Write down how often you have hip pain, the location of the pain, what makes it worse, and what it feels like. This information is not intended to replace advice given to you by your health care provider. Make sure you discuss any questions you have with your health care provider. Document Revised: 05/02/2019 Document Reviewed: 05/02/2019 Elsevier Patient Education  [place].

## 2021-03-13 ENCOUNTER — Other Ambulatory Visit (HOSPITAL_COMMUNITY)
Admission: RE | Admit: 2021-03-13 | Discharge: 2021-03-13 | Disposition: A | Discharge status: Home / Self Care | Payer: BC Managed Care – PPO | Source: Ambulatory Visit | Attending: Cardiovascular Disease | Admitting: Cardiovascular Disease

## 2021-03-13 ENCOUNTER — Other Ambulatory Visit: Payer: Self-pay

## 2021-03-13 DIAGNOSIS — Z20822 Contact with and (suspected) exposure to covid-19: Secondary | ICD-10-CM | POA: Diagnosis not present

## 2021-03-13 DIAGNOSIS — Z01812 Encounter for preprocedural laboratory examination: Secondary | ICD-10-CM | POA: Insufficient documentation

## 2021-03-13 LAB — SARS CORONAVIRUS 2 (TAT 6-24 HRS): SARS Coronavirus 2: NEGATIVE

## 2021-03-15 ENCOUNTER — Encounter (HOSPITAL_COMMUNITY)
Admission: RE | Admit: 2021-03-15 | Discharge: 2021-03-15 | Disposition: A | Discharge status: Home / Self Care | Payer: BC Managed Care – PPO | Source: Ambulatory Visit | Attending: Cardiovascular Disease | Admitting: Cardiovascular Disease

## 2021-03-15 ENCOUNTER — Encounter (HOSPITAL_COMMUNITY): Payer: Self-pay

## 2021-03-15 ENCOUNTER — Ambulatory Visit (HOSPITAL_BASED_OUTPATIENT_CLINIC_OR_DEPARTMENT_OTHER)
Admission: RE | Admit: 2021-03-15 | Discharge: 2021-03-15 | Disposition: A | Discharge status: Home / Self Care | Payer: BC Managed Care – PPO | Source: Ambulatory Visit | Attending: Cardiovascular Disease | Admitting: Cardiovascular Disease

## 2021-03-15 ENCOUNTER — Other Ambulatory Visit (HOSPITAL_COMMUNITY): Payer: BC Managed Care – PPO

## 2021-03-15 ENCOUNTER — Other Ambulatory Visit: Payer: Self-pay

## 2021-03-15 DIAGNOSIS — R9431 Abnormal electrocardiogram [ECG] [EKG]: Secondary | ICD-10-CM | POA: Insufficient documentation

## 2021-03-15 DIAGNOSIS — R079 Chest pain, unspecified: Secondary | ICD-10-CM

## 2021-03-15 LAB — NM MYOCAR MULTI W/SPECT W/WALL MOTION / EF
Estimated workload: 7 METS
Exercise duration (min): 5 min
Exercise duration (sec): 42 s
LV dias vol: 87 mL (ref 46–106)
LV sys vol: 36 mL
MPHR: 174 {beats}/min
Peak HR: 142 {beats}/min
Percent HR: 81 %
RATE: 0.45
RPE: 15
Rest HR: 66 {beats}/min
SDS: 1
SRS: 0
SSS: 1
TID: 0.85

## 2021-03-15 LAB — ECHOCARDIOGRAM COMPLETE: S' Lateral: 3.2 cm

## 2021-03-15 MED ORDER — SODIUM CHLORIDE FLUSH 0.9 % IV SOLN
INTRAVENOUS | Status: AC
  Administered 2021-03-15: 10 mL via INTRAVENOUS
  Filled 2021-03-15: qty 10

## 2021-03-15 MED ORDER — REGADENOSON 0.4 MG/5ML IV SOLN
INTRAVENOUS | Status: AC
  Administered 2021-03-15: 0.4 mg via INTRAVENOUS
  Filled 2021-03-15: qty 5

## 2021-03-15 MED ORDER — TECHNETIUM TC 99M TETROFOSMIN IV KIT
10.0000 | PACK | Freq: Once | INTRAVENOUS | Status: AC | PRN
  Administered 2021-03-15: 11 via INTRAVENOUS

## 2021-03-15 MED ORDER — TECHNETIUM TC 99M TETROFOSMIN IV KIT
30.0000 | PACK | Freq: Once | INTRAVENOUS | Status: AC | PRN
  Administered 2021-03-15: 32.5 via INTRAVENOUS

## 2021-03-15 NOTE — Progress Notes (Signed)
  Echocardiogram 2D Echocardiogram has been performed.  Terri Wiley 03/15/2021, 12:42 PM

## 2021-04-01 NOTE — Progress Notes (Incomplete)
CARDIOLOGY CONSULT NOTE       Patient ID: Terri Wiley MRN: [id number] DOB/AGE: 04-Jul-1976 45 y.o.  Referring Physician: Karie Kirks Primary Physician: Leslie Andrea, MD Primary Cardiologist: New Reason for Consultation: Chest Pain   Active Problems:   * No active hospital problems. *   HPI:  45 y.o. referred by Dr Karie Kirks 02/19/21 for chest pain History of GERD Seen in ED 10/02/20 with left sided chest pain going up arm, shoulder and neck Pain worse with movement of left arm Also with  Gas felt like she needed to belch Symptoms for 24 hours There was some numbness in her left arm CT negative for PE/Dissection Troponin negative and no acute ECG changes Also noted to be anemic with Hb 10 normal MCV Concern for radiculopathy and given some steroids. She had f/u plain films of spine 02/13/21 normal  Has thoracolumbar scoliosis  Pain started in October. Atypical not always exertional sharp on both sided of chest Can occur 1-2/x week or qow. 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 Has 5 kids 3 at home Youngest just went to boot camp at [employer] in [place]. She has hiatal hernia with indigestion   She had a 1/2 niece who died waiting for heart transplant for weak heart muscle   TTE 03/15/21 Normal EF 55-60% Myovue 03/15/21 normal no ischemia/infarct EF 58%  She has chronic left hip pain seen by surgery ordered CT to assess Known thoracolumbar scoliosis  ***  ROS All other systems reviewed and negative except as noted above  Past Medical History:  Diagnosis Date  . GERD (gastroesophageal reflux disease)   . Hernia cerebri ([place])     Family History  Problem Relation Age of Onset  . Liver disease Mother   . Hypertension Mother   . Diabetes Mother   . Aneurysm Father   . Hypertension Father     Social History   Socioeconomic History  . Marital status: Legally Separated    Spouse name: Not on file  . Number of children: Not on file  . Years of education:  Not on file  . Highest education level: Not on file  Occupational History  . Not on file  Tobacco Use  . Smoking status: Never Smoker  . Smokeless tobacco: Never Used  Vaping Use  . Vaping Use: Never used  Substance and Sexual Activity  . Alcohol use: No  . Drug use: No  . Sexual activity: Not on file  Other Topics Concern  . Not on file  Social History Narrative  . Not on file   Social Determinants of Health   Financial Resource Strain: Not on file  Food Insecurity: Not on file  Transportation Needs: Not on file  Physical Activity: Not on file  Stress: Not on file  Social Connections: Not on file  Intimate Partner Violence: Not on file    Past Surgical History:  Procedure Laterality Date  . HERNIA REPAIR    . tubiligation        Current Outpatient Medications:  .  acetaminophen (TYLENOL) 325 MG tablet, Take 650 mg by mouth every 6 (six) hours as needed. pain, Disp: , Rfl:  .  aspirin 81 MG EC tablet, Take 81 mg by mouth daily. Swallow whole., Disp: , Rfl:  .  cefdinir (OMNICEF) 300 MG capsule, Take 300 mg by mouth 2 (two) times daily., Disp: , Rfl:  .  cetirizine (ZYRTEC) 10 MG tablet, Take 10 mg by mouth daily., Disp: , Rfl:  .  FEROSUL 325 (65 Fe) MG tablet, Take 325 mg by mouth daily., Disp: , Rfl:  .  ibuprofen (ADVIL,MOTRIN) 200 MG tablet, Take 200 mg by mouth every 6 (six) hours as needed. For pain, Disp: , Rfl:  .  LORazepam (ATIVAN) 0.5 MG tablet, Take 0.5 mg by mouth daily as needed., Disp: , Rfl:     Physical Exam: There were no vitals taken for this visit.    Affect appropriate Healthy:  appears stated age 19: normal Neck supple with no adenopathy JVP normal no bruits no thyromegaly Lungs clear with no wheezing and good diaphragmatic motion Heart:  S1/S2 no murmur, no rub, gallop or click PMI normal Abdomen: benighn, BS positve, no tenderness, no AAA no bruit.  No HSM or HJR Distal pulses intact with no bruits No edema Neuro non-focal Skin  warm and dry No muscular weakness   Labs:   Lab Results  Component Value Date   WBC 6.2 10/02/2020   HGB 10.0 (L) 10/02/2020   HCT 30.5 (L) 10/02/2020   MCV 88.2 10/02/2020   PLT 251 10/02/2020   Radiology: NM Myocar Multi W/Spect W/Wall Motion / EF  Result Date: 03/15/2021  Treadmill attempted but patient unable to achieve adequate heart rate response and there was substantial lead motion artifact making interpretation impossible. Study therefore switched to Lexiscan infusion. No diagnostic ST segment changes were noted. There were occasional to frequent PVCs noted in recovery including couplets. These are upright in the inferior leads suggesting possible outflow tract origin. No sustained arrhythmias.  No significant myocardial perfusion defects to indicate scar or ischemia.  This is a low risk study.  Nuclear stress EF: 58%.    ECHOCARDIOGRAM COMPLETE  Result Date: 03/15/2021    ECHOCARDIOGRAM REPORT   Patient Name:   Terri Wiley Date of Exam: 03/15/2021 Medical Rec #:  [id number]    Height:       69.5 in Accession #:    [id number]   Weight:       273.0 lb Date of Birth:  [birth year]    BSA:          2.370 m Patient Age:    62 years     BP:           112/71 mmHg Patient Gender: F            HR:           76 bpm. Exam Location:  [facility] Procedure: 2D Echo, Color Doppler and Cardiac Doppler Indications:    R94.31 Abnormal EKG  History:        Patient has no prior history of Echocardiogram examinations.  Sonographer:    Bernadene Person RDCS Referring Phys: [place]  1. Left ventricular ejection fraction, by estimation, is 55 to 60%. The left ventricle has normal function. The left ventricle has no regional wall motion abnormalities. Left ventricular diastolic parameters are indeterminate.  2. Right ventricular systolic function is normal. The right ventricular size is normal. There is normal pulmonary artery systolic pressure. The estimated right ventricular systolic  pressure is 73.7 mmHg.  3. A small pericardial effusion is present. The pericardial effusion is posterior to the left ventricle.  4. The mitral valve is grossly normal. Trivial mitral valve regurgitation.  5. The aortic valve is tricuspid. Aortic valve regurgitation is not visualized.  6. The inferior vena cava is normal in size with greater than 50% respiratory variability, suggesting right atrial pressure of 3 mmHg. FINDINGS  Left Ventricle: Left ventricular ejection fraction, by estimation, is 55 to 60%. The left ventricle has normal function. The left ventricle has no regional wall motion abnormalities. The left ventricular internal cavity size was normal in size. There is  no left ventricular hypertrophy. Left ventricular diastolic parameters are indeterminate. Right Ventricle: The right ventricular size is normal. No increase in right ventricular wall thickness. Right ventricular systolic function is normal. There is normal pulmonary artery systolic pressure. The tricuspid regurgitant velocity is 2.03 m/s, and  with an assumed right atrial pressure of 3 mmHg, the estimated right ventricular systolic pressure is 36.6 mmHg. Left Atrium: Left atrial size was normal in size. Right Atrium: Right atrial size was normal in size. Pericardium: A small pericardial effusion is present. The pericardial effusion is posterior to the left ventricle. Mitral Valve: The mitral valve is grossly normal. Trivial mitral valve regurgitation. Tricuspid Valve: The tricuspid valve is grossly normal. Tricuspid valve regurgitation is trivial. Aortic Valve: The aortic valve is tricuspid. Aortic valve regurgitation is not visualized. Pulmonic Valve: The pulmonic valve was grossly normal. Pulmonic valve regurgitation is trivial. Aorta: The aortic root is normal in size and structure. Venous: The inferior vena cava is normal in size with greater than 50% respiratory variability, suggesting right atrial pressure of 3 mmHg. IAS/Shunts: No  atrial level shunt detected by color flow Doppler.  LEFT VENTRICLE PLAX 2D LVIDd:         4.80 cm LVIDs:         3.20 cm LV PW:         1.00 cm LV IVS:        0.90 cm LVOT diam:     2.10 cm LV SV:         90 LV SV Index:   38 LVOT Area:     3.46 cm  RIGHT VENTRICLE RV S prime:     9.39 cm/s TAPSE (M-mode): 1.9 cm LEFT ATRIUM             Index       RIGHT ATRIUM           Index LA diam:        3.50 cm 1.48 cm/m  RA Area:     13.60 cm LA Vol (A2C):   42.4 ml 17.89 ml/m RA Volume:   28.40 ml  11.98 ml/m LA Vol (A4C):   46.5 ml 19.62 ml/m LA Biplane Vol: 46.5 ml 19.62 ml/m  AORTIC VALVE LVOT Vmax:   105.62 cm/s LVOT Vmean:  72.625 cm/s LVOT VTI:    0.261 m  AORTA Ao Root diam: 3.00 cm Ao Asc diam:  3.20 cm TRICUSPID VALVE TR Peak grad:   16.5 mmHg TR Vmax:        203.00 cm/s  SHUNTS Systemic VTI:  0.26 m Systemic Diam: 2.10 cm Rozann Lesches MD Electronically signed by Rozann Lesches MD Signature Date/Time: 03/15/2021/4:35:05 PM    Final     EKG: NSR PVC nonspecific ST changes    ASSESSMENT AND PLAN:   1. Chest pain: atypical no acute ECG changes troponin negative after 24 hours symptoms Myovue normal no ischemia and echo with no structural hear disease observe  2. Anemia:  F/u primary not microcytic  3. Arm pain Lumbar/spine films done 02/13/21 normal -scoliosis likely contributes to pains  4. Hip Pain: still pending CT scan hip ordered by general surgery   F/U cardiology PRN     Signed: Jenkins Rouge 04/01/2021, 1:52 PM

## 2021-04-03 ENCOUNTER — Ambulatory Visit (HOSPITAL_COMMUNITY)
Admission: RE | Admit: 2021-04-03 | Discharge: 2021-04-03 | Disposition: A | Discharge status: Home / Self Care | Payer: BC Managed Care – PPO | Source: Ambulatory Visit | Attending: General Surgery | Admitting: General Surgery

## 2021-04-03 DIAGNOSIS — G8929 Other chronic pain: Secondary | ICD-10-CM | POA: Diagnosis present

## 2021-04-03 DIAGNOSIS — R2242 Localized swelling, mass and lump, left lower limb: Secondary | ICD-10-CM | POA: Diagnosis present

## 2021-04-03 DIAGNOSIS — M25552 Pain in left hip: Secondary | ICD-10-CM | POA: Diagnosis present

## 2021-04-03 MED ORDER — IOHEXOL 300 MG/ML  SOLN
100.0000 mL | Freq: Once | INTRAMUSCULAR | Status: AC | PRN
  Administered 2021-04-03: 75 mL via INTRAVENOUS

## 2021-04-10 ENCOUNTER — Ambulatory Visit: Payer: BC Managed Care – PPO | Admitting: Cardiovascular Disease

## 2021-04-12 ENCOUNTER — Other Ambulatory Visit: Payer: Self-pay | Admitting: Family Medicine

## 2021-04-12 ENCOUNTER — Other Ambulatory Visit (HOSPITAL_COMMUNITY): Payer: Self-pay | Admitting: Family Medicine

## 2021-04-12 DIAGNOSIS — R1907 Generalized intra-abdominal and pelvic swelling, mass and lump: Secondary | ICD-10-CM

## 2021-04-18 ENCOUNTER — Encounter (HOSPITAL_BASED_OUTPATIENT_CLINIC_OR_DEPARTMENT_OTHER): Payer: Self-pay

## 2021-04-18 ENCOUNTER — Ambulatory Visit (HOSPITAL_BASED_OUTPATIENT_CLINIC_OR_DEPARTMENT_OTHER)
Admission: RE | Admit: 2021-04-18 | Discharge: 2021-04-18 | Disposition: A | Discharge status: Home / Self Care | Payer: BC Managed Care – PPO | Source: Ambulatory Visit | Attending: Family Medicine | Admitting: Family Medicine

## 2021-04-18 ENCOUNTER — Other Ambulatory Visit: Payer: Self-pay

## 2021-04-18 DIAGNOSIS — R1907 Generalized intra-abdominal and pelvic swelling, mass and lump: Secondary | ICD-10-CM | POA: Insufficient documentation

## 2021-04-18 MED ORDER — IOHEXOL 300 MG/ML  SOLN
100.0000 mL | Freq: Once | INTRAMUSCULAR | Status: AC | PRN
  Administered 2021-04-18: 100 mL via INTRAVENOUS

## 2021-05-14 ENCOUNTER — Other Ambulatory Visit: Payer: Self-pay | Admitting: Family Medicine

## 2021-05-14 ENCOUNTER — Other Ambulatory Visit (HOSPITAL_COMMUNITY): Payer: Self-pay | Admitting: Family Medicine

## 2021-05-14 DIAGNOSIS — K829 Disease of gallbladder, unspecified: Secondary | ICD-10-CM

## 2021-05-15 ENCOUNTER — Other Ambulatory Visit: Payer: Self-pay

## 2021-05-15 ENCOUNTER — Emergency Department (HOSPITAL_COMMUNITY): Payer: BC Managed Care – PPO

## 2021-05-15 ENCOUNTER — Emergency Department (HOSPITAL_COMMUNITY)
Admission: RE | Admit: 2021-05-15 | Discharge: 2021-05-15 | Disposition: A | Discharge status: Home / Self Care | Payer: BC Managed Care – PPO | Source: Ambulatory Visit | Attending: Family Medicine | Admitting: Family Medicine

## 2021-05-15 ENCOUNTER — Emergency Department (HOSPITAL_COMMUNITY)
Admission: EM | Admit: 2021-05-15 | Discharge: 2021-05-15 | Disposition: A | Discharge status: Home / Self Care | Payer: BC Managed Care – PPO | Attending: Emergency Medicine | Admitting: Emergency Medicine

## 2021-05-15 ENCOUNTER — Encounter (HOSPITAL_COMMUNITY): Payer: Self-pay

## 2021-05-15 DIAGNOSIS — R079 Chest pain, unspecified: Secondary | ICD-10-CM | POA: Diagnosis present

## 2021-05-15 DIAGNOSIS — K219 Gastro-esophageal reflux disease without esophagitis: Secondary | ICD-10-CM | POA: Insufficient documentation

## 2021-05-15 DIAGNOSIS — K829 Disease of gallbladder, unspecified: Secondary | ICD-10-CM | POA: Insufficient documentation

## 2021-05-15 DIAGNOSIS — R112 Nausea with vomiting, unspecified: Secondary | ICD-10-CM | POA: Insufficient documentation

## 2021-05-15 DIAGNOSIS — Z7982 Long term (current) use of aspirin: Secondary | ICD-10-CM | POA: Insufficient documentation

## 2021-05-15 DIAGNOSIS — R001 Bradycardia, unspecified: Secondary | ICD-10-CM | POA: Diagnosis not present

## 2021-05-15 DIAGNOSIS — M25512 Pain in left shoulder: Secondary | ICD-10-CM | POA: Insufficient documentation

## 2021-05-15 LAB — BASIC METABOLIC PANEL
Anion gap: 6 (ref 5–15)
BUN: 9 mg/dL (ref 6–20)
CO2: 26 mmol/L (ref 22–32)
Calcium: 8.4 mg/dL — ABNORMAL LOW (ref 8.9–10.3)
Chloride: 107 mmol/L (ref 98–111)
Creatinine, Ser: 0.61 mg/dL (ref 0.44–1.00)
GFR, Estimated: >60 mL/min (ref >60)
Glucose, Bld: 109 mg/dL — ABNORMAL HIGH (ref 70–99)
Potassium: 3.4 mmol/L — ABNORMAL LOW (ref 3.5–5.1)
Sodium: 139 mmol/L (ref 135–145)

## 2021-05-15 LAB — HEPATIC FUNCTION PANEL
ALT: 17 U/L (ref 0–44)
AST: 18 U/L (ref 15–41)
Albumin: 3.3 g/dL — ABNORMAL LOW (ref 3.5–5.0)
Alkaline Phosphatase: 81 U/L (ref 38–126)
Bilirubin, Direct: <0.1 mg/dL (ref 0.0–0.2)
Total Bilirubin: 0.5 mg/dL (ref 0.3–1.2)
Total Protein: 7.4 g/dL (ref 6.5–8.1)

## 2021-05-15 LAB — CBC
HCT: 33.7 % — ABNORMAL LOW (ref 36.0–46.0)
Hemoglobin: 10.6 g/dL — ABNORMAL LOW (ref 12.0–15.0)
MCH: 28.4 pg (ref 26.0–34.0)
MCHC: 31.5 g/dL (ref 30.0–36.0)
MCV: 90.3 fL (ref 80.0–100.0)
Platelets: 240 10*3/uL (ref 150–400)
RBC: 3.73 MIL/uL — ABNORMAL LOW (ref 3.87–5.11)
RDW: 14.1 % (ref 11.5–15.5)
WBC: 4.6 10*3/uL (ref 4.0–10.5)
nRBC: 0 % (ref 0.0–0.2)

## 2021-05-15 LAB — TROPONIN I (HIGH SENSITIVITY)
Troponin I (High Sensitivity): <2 ng/L (ref <18)
Troponin I (High Sensitivity): <2 ng/L (ref <18)

## 2021-05-15 LAB — POC URINE PREG, ED: Preg Test, Ur: NEGATIVE

## 2021-05-15 MED ORDER — LIDOCAINE VISCOUS HCL 2 % MT SOLN
15.0000 mL | Freq: Once | OROMUCOSAL | Status: DC
  Filled 2021-05-15: qty 15

## 2021-05-15 MED ORDER — ALUM & MAG HYDROXIDE-SIMETH 200-200-20 MG/5ML PO SUSP
30.0000 mL | Freq: Once | ORAL | Status: DC
  Filled 2021-05-15: qty 30

## 2021-05-15 MED ORDER — METHOCARBAMOL 500 MG PO TABS: 500.0000 mg | ORAL_TABLET | Freq: Two times a day (BID) | ORAL | 0 refills | Status: DC

## 2021-05-15 MED ORDER — ONDANSETRON HCL 4 MG/2ML IJ SOLN
4.0000 mg | Freq: Once | INTRAMUSCULAR | Status: AC
  Administered 2021-05-15: 4 mg via INTRAVENOUS
  Filled 2021-05-15: qty 2

## 2021-05-15 NOTE — ED Notes (Signed)
Pt ambulated with portable pulse ox. 02 ranged from 92-96% and HR ranged from 59-67. 59 initially and went up while ambulating. pt tolerated ambulating well. PA aware.

## 2021-05-15 NOTE — Discharge Instructions (Addendum)
Your work-up today was overall reassuring.  Please make sure to follow-up with GI.  Your heart rate was little bit lower than normal today, this is not emergent but I would follow-up with your cardiologist.  Return to the ER for any new or worsening symptoms.  Please take the muscle laxer at night and do not drink or drive on the medication that can make you sleepy

## 2021-05-15 NOTE — ED Notes (Signed)
Pt left prior to discharge px being ordered. Pt called and notified px was electronically sent to [facility] in [place].

## 2021-05-15 NOTE — ED Triage Notes (Signed)
Pt to er, pt states that about 20 minutes ago she had sudden onset of chest pain and nausea and sob, pt states that is a gnawing pain at this time,

## 2021-05-15 NOTE — ED Provider Notes (Addendum)
[facility] EMERGENCY DEPARTMENT Provider Note   CSN: [id number] Arrival date & time: 05/15/21  1149     History Chief Complaint  Patient presents with  . Chest Pain    Terri Wiley is a 45 y.o. female.  HPI 45 year old female with a history of GERD with complaints of 30 minutes of left-sided chest pain with nausea which began this morning.  Patient scribes it as "gnawing".  She has had 1 episode of nonbloody nonbilious vomiting.  She also describes some tingling going down her left arm.  Denies any headache, no facial droop, no word slurring, no vision changes vision.  No loss of bowel bladder control, no numbness in her upper or lower extremities.  Denies any syncope.  Patient had a echo done on 3/18 of this year which showed an EF of 50 to 55%, ordered by Dr. Johnsie Cancel.  She has not had anything to eat this morning.  She reports following with GI for "gallbladder issues", that she cannot really recall what these problems are and I cannot see any visit notes from the chart review.  She denies any fevers or chills.  No shortness of breath, no pleuritic symptoms, no leg swelling  Per chart review, she did have a right upper quadrant ultrasound done by Dr. Karie Kirks.  There is no official reading, however per my review she does appear to have a fairly large gallstone in the gallbladder.    Past Medical History:  Diagnosis Date  . GERD (gastroesophageal reflux disease)   . Hernia cerebri [facility])     Patient Active Problem List   Diagnosis Date Noted  . Chronic left hip pain 02/28/2021  . Mass of left hip region 02/28/2021    Past Surgical History:  Procedure Laterality Date  . HERNIA REPAIR    . tubiligation       OB History   No obstetric history on file.     Family History  Problem Relation Age of Onset  . Liver disease Mother   . Hypertension Mother   . Diabetes Mother   . Aneurysm Father   . Hypertension Father     Social History   Tobacco Use  . Smoking status:  Never Smoker  . Smokeless tobacco: Never Used  Vaping Use  . Vaping Use: Never used  Substance Use Topics  . Alcohol use: No  . Drug use: No    Home Medications Prior to Admission medications   Medication Sig Start Date End Date Taking? Authorizing Provider  acetaminophen (TYLENOL) 325 MG tablet Take 650 mg by mouth every 6 (six) hours as needed. pain   Yes [provider]  aspirin 81 MG EC tablet Take 81 mg by mouth daily. Swallow whole.   Yes [provider]  cetirizine (ZYRTEC) 10 MG tablet Take 10 mg by mouth daily. 01/31/21  Yes [provider]  doxycycline (VIBRAMYCIN) 100 MG capsule Take 100 mg by mouth 2 (two) times daily. 05/14/21  Yes [provider]  FEROSUL 325 (65 Fe) MG tablet Take 325 mg by mouth daily. 12/02/20  Yes [provider]  furosemide (LASIX) 40 MG tablet Take 40 mg by mouth daily as needed for fluid or edema. 04/30/21  Yes [provider]  ibuprofen (ADVIL,MOTRIN) 200 MG tablet Take 200 mg by mouth every 6 (six) hours as needed. For pain   Yes [provider]  LORazepam (ATIVAN) 0.5 MG tablet Take 0.5 mg by mouth daily as needed for anxiety. 10/08/20  Yes  [provider]  methocarbamol (ROBAXIN) 500 MG tablet Take 1 tablet (500 mg total) by mouth 2 (two) times daily. 05/15/21  Yes Garald Balding, PA-C  cefdinir (OMNICEF) 300 MG capsule Take 300 mg by mouth 2 (two) times daily. 12/18/20   [provider]    Allergies    Bee venom and Penicillins  Review of Systems   Review of Systems  Constitutional: Negative for chills and fever.  HENT: Negative for ear pain and sore throat.   Eyes: Negative for pain and visual disturbance.  Respiratory: Negative for cough and shortness of breath.   Cardiovascular: Positive for chest pain. Negative for palpitations.  Gastrointestinal: Positive for nausea and vomiting. Negative for abdominal pain.  Genitourinary: Negative for dysuria and hematuria.   Musculoskeletal: Negative for arthralgias and back pain.  Skin: Negative for color change and rash.  Neurological: Negative for seizures and syncope.  All other systems reviewed and are negative.   Physical Exam Updated Vital Signs BP 136/64 (BP Location: Right Arm)   Pulse (!) 57   Temp 98.1 F (36.7 C) (Oral)   Resp 18   Ht 5' 9.5" (1.765 m)   Wt 122 kg   SpO2 98%   BMI 39.15 kg/m   Physical Exam Vitals and nursing note reviewed.  Constitutional:      General: She is not in acute distress.    Appearance: She is well-developed. She is not ill-appearing or diaphoretic.  HENT:     Head: Normocephalic and atraumatic.  Eyes:     Conjunctiva/sclera: Conjunctivae normal.  Cardiovascular:     Rate and Rhythm: Normal rate and regular rhythm.     Heart sounds: No murmur heard.   Pulmonary:     Effort: Pulmonary effort is normal. No respiratory distress.     Breath sounds: Normal breath sounds.  Abdominal:     Palpations: Abdomen is soft. There is no fluid wave.     Tenderness: There is no abdominal tenderness.  Musculoskeletal:        General: Normal range of motion.       Arms:     Cervical back: Neck supple.     Right lower leg: No tenderness. No edema.     Left lower leg: No tenderness. No edema.     Comments: Tenderness to palpation along the left trapezius muscles.  No cervical midline tenderness, 5/5 strength in upper and lower extremities bilaterally.  Neurovascularly intact.  Skin:    General: Skin is warm and dry.  Neurological:     General: No focal deficit present.     Mental Status: She is alert.     Comments: Mental Status:  Alert, thought content appropriate, able to give a coherent history. Speech fluent without evidence of aphasia. Able to follow 2 step commands without difficulty.  Cranial Nerves:  II: Peripheral visual fields grossly normal, pupils equal, round, reactive to light III,IV, VI: ptosis not present, extra-ocular motions intact  bilaterally  V,VII: smile symmetric, facial light touch sensation equal VIII: hearing grossly normal to voice  X: uvula elevates symmetrically  XI: bilateral shoulder shrug symmetric and strong XII: midline tongue extension without fassiculations Motor:  Normal tone. 5/5 strength of BUE and BLE major muscle groups including strong and equal grip strength and dorsiflexion/plantar flexion Sensory: light touch normal in all extremities. Cerebellar: normal finger-to-nose with bilateral upper extremities, Romberg sign absent Gait: Not assessed  Psychiatric:        Mood and Affect: Mood normal.  Behavior: Behavior normal.     ED Results / Procedures / Treatments   Labs (all labs ordered are listed, but only abnormal results are displayed) Labs Reviewed  BASIC METABOLIC PANEL - Abnormal; Notable for the following components:      Result Value   Potassium 3.4 (*)    Glucose, Bld 109 (*)    Calcium 8.4 (*)    All other components within normal limits  CBC - Abnormal; Notable for the following components:   RBC 3.73 (*)    Hemoglobin 10.6 (*)    HCT 33.7 (*)    All other components within normal limits  HEPATIC FUNCTION PANEL - Abnormal; Notable for the following components:   Albumin 3.3 (*)    All other components within normal limits  POC URINE PREG, ED  TROPONIN I (HIGH SENSITIVITY)  TROPONIN I (HIGH SENSITIVITY)    EKG EKG Interpretation  Date/Time:  Wednesday May 15 2021 11:58:14 EDT Ventricular Rate:  61 PR Interval:  158 QRS Duration: 80 QT Interval:  432 QTC Calculation: 434 R Axis:   -3 Text Interpretation: Sinus rhythm with occasional Premature ventricular complexes Otherwise normal ECG No significant change since last tracing Confirmed by Calvert Cantor [phone]) on 05/15/2021 3:38:58 PM   Radiology DG Chest 2 View  Result Date: 05/15/2021 CLINICAL DATA:  Sudden onset of chest pain with nausea and shortness of breath. EXAM: CHEST - 2 VIEW COMPARISON:   Radiographs 10/02/2020.  CT 10/02/2020. FINDINGS: The heart size and mediastinal contours are stable with a moderate size hiatal hernia. The lungs are clear. There is no pleural effusion or pneumothorax. The bones appear unchanged with a thoracolumbar scoliosis. IMPRESSION: Stable chest without evidence of active cardiopulmonary process. Moderate-sized hiatal hernia. Electronically Signed   By: Richardean Sale M.D.   On: 05/15/2021 12:50    Procedures Procedures   Medications Ordered in ED Medications  alum & mag hydroxide-simeth (MAALOX/MYLANTA) 200-200-20 MG/5ML suspension 30 mL (0 mLs Oral Hold 05/15/21 1338)    And  lidocaine (XYLOCAINE) 2 % viscous mouth solution 15 mL (0 mLs Oral Hold 05/15/21 1338)  ondansetron (ZOFRAN) injection 4 mg (4 mg Intravenous Given 05/15/21 1407)    ED Course  I have reviewed the triage vital signs and the nursing notes.  Pertinent labs & imaging results that were available during my care of the patient were reviewed by me and considered in my medical decision making (see chart for details).    MDM Rules/Calculators/A&P                          45 year old female presents to the ER with complaints of chest pain, nausea, vomiting, and left-sided tingling in her left arm.  On arrival, she is well-appearing, no acute distress, resting comfortably in the ER bed.  Vitals overall reassuring.  She was slightly bradycardic here on arrival, per chart review, rate has been in the low 60s in the past.  Physical exam with no focal neurodeficits.  She has equal strength in upper and lower extremities bilaterally.  No facial droop, no word slurring.  Her abdomen is soft and nontender.  Labs and imaging ordered, reviewed and interpreted by me.  CBC without leukocytosis, hemoglobin of 10.6 appears to be stable.  CMP with mild hypokalemia 3.4 and hypocalcemia of 8.4, normal hepatic function panel.  Delta troponin negative.  Chest x-ray with a moderate sized hiatal  hernia.  Patient received Zofran, offered GI cocktail however the  patient refused.  Notes improvement in symptoms.  Has no chest pain.  Overall chest pain work-up reassuring.  No signs of focal neurodeficit, low suspicion for stroke.  She has no abdominal tenderness, low suspicion for incarcerated hiatal hernia, or cholecystitis.  She did have a abdominal ultrasound done today but no final reading has been done.  Low suspicion for acute abdomen.  Cardiac work-up was reassuring.  Low suspicion for stroke.  Patient progressively bradycardic here, however ambulated with no symptoms, heart rate appropriately improved with ambulation.  Patient has GI follow-up pending.  Encouraged her to continue taking her PPI.  Prescribe muscle relaxer for spasms in the shoulder.  Patient instructed not drink or drive the medication to take at night secondary to sedating side effects.  PCP follow-up also encouraged.  She was understanding is agreeable.  Stable for discharge.  Case discussed with Dr. Eulis Foster who is agreeable to the above plan and disposition Final Clinical Impression(s) / ED Diagnoses Final diagnoses:  Chest pain, unspecified type  Bradycardia    Rx / DC Orders ED Discharge Orders         Ordered    methocarbamol (ROBAXIN) 500 MG tablet  2 times daily        05/15/21 [place], Avontae Burkhead A, PA-C 05/15/21 1657    Truddie Hidden, MD 05/15/21 1711

## 2021-05-29 ENCOUNTER — Other Ambulatory Visit (HOSPITAL_COMMUNITY): Payer: Self-pay | Admitting: Family Medicine

## 2021-05-29 DIAGNOSIS — K8010 Calculus of gallbladder with chronic cholecystitis without obstruction: Secondary | ICD-10-CM

## 2021-06-18 ENCOUNTER — Encounter (HOSPITAL_COMMUNITY): Payer: Self-pay

## 2021-06-18 ENCOUNTER — Ambulatory Visit (HOSPITAL_COMMUNITY)
Admission: RE | Admit: 2021-06-18 | Discharge: 2021-06-18 | Disposition: A | Discharge status: Home / Self Care | Payer: BC Managed Care – PPO | Source: Ambulatory Visit | Attending: Family Medicine | Admitting: Family Medicine

## 2021-06-18 DIAGNOSIS — K8010 Calculus of gallbladder with chronic cholecystitis without obstruction: Secondary | ICD-10-CM | POA: Insufficient documentation

## 2021-06-18 MED ORDER — TECHNETIUM TC 99M MEBROFENIN IV KIT
5.0000 | PACK | Freq: Once | INTRAVENOUS | Status: AC | PRN
  Administered 2021-06-18: 5.5 via INTRAVENOUS

## 2021-06-19 ENCOUNTER — Ambulatory Visit: Payer: BC Managed Care – PPO | Admitting: Adult Health

## 2021-06-19 ENCOUNTER — Encounter: Payer: Self-pay | Admitting: Adult Health

## 2021-06-19 ENCOUNTER — Other Ambulatory Visit (HOSPITAL_COMMUNITY)
Admission: RE | Admit: 2021-06-19 | Discharge: 2021-06-19 | Disposition: A | Discharge status: Home / Self Care | Payer: BC Managed Care – PPO | Source: Ambulatory Visit | Attending: Adult Health | Admitting: Adult Health

## 2021-06-19 ENCOUNTER — Other Ambulatory Visit: Payer: Self-pay

## 2021-06-19 VITALS — BP 132/85 | HR 73 | Ht 69.5 in | Wt 275.0 lb

## 2021-06-19 DIAGNOSIS — Z124 Encounter for screening for malignant neoplasm of cervix: Secondary | ICD-10-CM | POA: Insufficient documentation

## 2021-06-19 DIAGNOSIS — N921 Excessive and frequent menstruation with irregular cycle: Secondary | ICD-10-CM | POA: Diagnosis not present

## 2021-06-19 DIAGNOSIS — D219 Benign neoplasm of connective and other soft tissue, unspecified: Secondary | ICD-10-CM

## 2021-06-19 DIAGNOSIS — N951 Menopausal and female climacteric states: Secondary | ICD-10-CM

## 2021-06-19 NOTE — Progress Notes (Deleted)
  Subjective:     Patient ID: Terri Wiley, female   DOB: May 05, 1976, 45 y.o.   MRN: [id number]  HPI   Review of Systems     Objective:   Physical Exam     Assessment:        Plan:

## 2021-06-19 NOTE — Progress Notes (Signed)
  Subjective:     Patient ID: Terri Wiley, female   DOB: 12-23-1976, 45 y.o.   MRN: [id number]  HPI Terri Wiley is a 45 year old black female, divorced, [id number], referred by Carlis Abbott NP. She had CT that showed fibroids in April 2022. She had period December then none til May and was heavy. PCP is Dr Karie Kirks.   Review of Systems Periods normal till December 2021, skipped til May and period was heavy, changed pads every 2 hours, with some clots and cramps, and it lasted about 10 days Dizzy at times +hot flashes, night sweats and moody  Has sex partner Reviewed past medical,surgical, social and family history. Reviewed medications and allergies.      Objective:   Physical Exam BP 132/85 (BP Location: Right Arm, Patient Position: Sitting, Cuff Size: Large)   Pulse 73   Ht 5' 9.5" (1.765 m)   Wt 275 lb (124.7 kg)   LMP 05/13/2021 Comment: Tubal ligation  BMI 40.03 kg/m  Skin warm and dry. Lungs: clear to ausculation bilaterally. Cardiovascular: regular rate and rhythm.    Pelvic: external genitalia is normal in appearance no lesions, vagina: white discharge with odor,urethra has no lesions or masses noted, cervix:smooth and bulbous, Pap with GC/CHL, and HR HPV genotyping performed,uterus: slightly enlarged, non tender, no masses felt, adnexa: no masses or tenderness noted. Bladder is non tender and no masses felt.  AA is 0 Fall risk is low Depression screen PHQ 2/9 06/19/2021  Decreased Interest 1  Down, Depressed, Hopeless 0  PHQ - 2 Score 1  Altered sleeping 3  Tired, decreased energy 3  Change in appetite 2  Feeling bad or failure about yourself  0  Trouble concentrating 0  Moving slowly or fidgety/restless 0  Suicidal thoughts 0  PHQ-9 Score 9    GAD 7 : Generalized Anxiety Score 06/19/2021  Nervous, Anxious, on Edge 0  Control/stop worrying 3  Worry too much - different things 3  Trouble relaxing 3  Restless 0  Easily annoyed or irritable 2  Afraid - awful might happen  0  Total GAD 7 Score 11      Upstream - 06/19/21 1037       Pregnancy Intention Screening   Does the patient want to become pregnant in the next year? No    Does the patient's partner want to become pregnant in the next year? No    Would the patient like to discuss contraceptive options today? No      Contraception Wrap Up   Current Method Female Sterilization    End Method Female Sterilization    Contraception Counseling Provided No            Examination chaperoned by Engineer, materials  Assessment:    1. Menorrhagia with irregular cycle Will get [place] to assess uterus and check labs  - US PELVIS TRANSVAGINAL NON-OB (TV ONLY); Future - CBC - Comprehensive metabolic panel - TSH  2. Fibroids Will get Pelvic [facility] at [facility] 06/25/21 at 3:30 pm Will talk when results back Review handout on fibroids   - US PELVIS TRANSVAGINAL NON-OB (TV ONLY); Future  3. Perimenopause Discussed symptoms with her  Review handout on menopause  4. Routine Papanicolaou smear Pap sent - Cytology - PAP( [place])     Plan:     Follow up with me in 2 weeks to review labs and [place] and talk options

## 2021-06-19 NOTE — Patient Instructions (Addendum)
Uterine Fibroids  Uterine fibroids, also called leiomyomas, are noncancerous (benign) tumors that can grow in the uterus. They can cause heavy menstrual bleeding and pain. Fibroids may also grow in the fallopian tubes, cervix, or tissues (ligaments) near the uterus. You may have one or many fibroids. Fibroids vary in size, weight, and where they grow in the uterus. Some can become quite large. Most fibroids do notrequire medical treatment. What are the causes? The cause of this condition is not known. What increases the risk? You are more likely to develop this condition if you: Are in your 30s or 40s and have not gone through menopause. Have a family history of this condition. Are of African American descent. Started your menstrual period at age 6 or younger. Have never given birth. Are overweight or obese. What are the signs or symptoms? Many women do not have any symptoms. Symptoms of this condition may include: Heavy menstrual bleeding. Bleeding between menstrual periods. Pain and pressure in the pelvic area, between your hip bones. Pain during sex. Bladder problems, such as needing to urinate right away or more often than usual. Inability to have children (infertility). Failure to carry pregnancy to term (miscarriage). How is this diagnosed? This condition may be diagnosed based on: Your symptoms and medical history. A physical exam. A pelvic exam that includes feeling for any tumors. Imaging tests, such as ultrasound or MRI. How is this treated? Treatment for this condition may include follow-up visits with your health care provider to monitor your fibroids for any changes. Other treatment may include: Medicines, such as: Medicines to relieve pain, including aspirin and NSAIDs, such as ibuprofen or naproxen. Hormone therapy. Treatment may be given as a pill or an injection, or it may be inserted into the uterus using an intrauterine device (IUD). Surgery that would do one of  the following: Remove the fibroids (myomectomy). This may be recommended if fibroids affect your fertility and you want to become pregnant. Remove the uterus (hysterectomy). Block the blood supply to the fibroids (uterine artery embolization). This can cause them to shrink and die. Follow these instructions at home: Medicines Take over-the-counter and prescription medicines only as told by your health care provider. Ask your health care provider if you should take iron pills or eat more iron-rich foods, such as dark green, leafy vegetables. Heavy menstrual bleeding can cause low iron levels. Managing pain If directed, apply heat to your back or abdomen to reduce pain. Use the heat source that your health care provider recommends, such as a moist heat pack or a heating pad. To apply heat: Place a towel between your skin and the heat source. Leave the heat on for 20-30 minutes. Remove the heat if your skin turns bright red. This is especially important if you are unable to feel pain, heat, or cold. You may have a greater risk of getting burned.  General instructions Pay close attention to your menstrual cycle. Tell your health care provider about any changes, such as: Heavier bleeding that requires you to change your pads or tampons more than usual. A change in the number of days that your menstrual period lasts. A change in symptoms that come with your menstrual period, such as back pain or cramps in your abdomen. Keep all follow-up visits. This is important, especially if your fibroids need to be monitored for any changes. Contact a health care provider if you: Have pelvic pain, back pain, or cramps in your abdomen that do not get better with medicine  or heat. Develop new bleeding between menstrual periods. Have increased bleeding during or between menstrual periods. Feel more tired or weak than usual. Feel light-headed. Get help right away if you: Faint. Have pelvic pain that suddenly  gets worse. Have severe vaginal bleeding that soaks a tampon or pad in 30 minutes or less. Summary Uterine fibroids are noncancerous (benign) tumors that can develop in the uterus. The exact cause of this condition is not known. Most fibroids do not require medical treatment unless they affect your ability to have children (fertility). Contact a health care provider if you have pelvic pain, back pain, or cramps in your abdomen that do not get better with medicines. Get help right away if you faint, have pelvic pain that suddenly gets worse, or have severe vaginal bleeding. This information is not intended to replace advice given to you by your health care provider. Make sure you discuss any questions you have with your healthcare provider. Document Revised: 07/17/2020 Document Reviewed: 07/17/2020 Elsevier Patient Education  [place]. https://www.womenshealth.gov/menopause/menopause-basics"> https://www.clinicalkey.com">  Menopause Menopause is the normal time of a woman's life when menstrual periods stop completely. It marks the natural end to a woman's ability to become pregnant. It can be defined as the absence of a menstrual period for 12 months without another medical cause. The transition to menopause (perimenopause) most often happens between the ages of 24 and 45, and can last for many years. During perimenopause, hormone levels change in your body, which can cause symptoms and affect your health. Menopause may increase your risk for: Weakened bones (osteoporosis), which causes fractures. Depression. Hardening and narrowing of the arteries (atherosclerosis), which can cause heart attacks and strokes. What are the causes? This condition is usually caused by a natural change in hormone levels that happens as you get older. The condition may also be caused by changes that are not natural, including: Surgery to remove both ovaries (surgical menopause). Side effects from some  medicines, such as chemotherapy used to treat cancer (chemical menopause). What increases the risk? This condition is more likely to start at an earlier age if you have certain medical conditions or have undergone treatments, including: A tumor of the pituitary gland in the brain. A disease that affects the ovaries and hormones. Certain cancer treatments, such as chemotherapy or hormone therapy, or radiation therapy on the pelvis. Heavy smoking and excessive alcohol use. Family history of early menopause. This condition is also more likely to develop earlier in women who are verythin. What are the signs or symptoms? Symptoms of this condition include: Hot flashes. Irregular menstrual periods. Night sweats. Changes in feelings about sex. This could be a decrease in sex drive or an increased discomfort around your sexuality. Vaginal dryness and thinning of the vaginal walls. This may cause painful sex. Dryness of the skin and development of wrinkles. Headaches. Problems sleeping (insomnia). Mood swings or irritability. Memory problems. Weight gain. Hair growth on the face and chest. Bladder infections or problems with urinating. How is this diagnosed? This condition is diagnosed based on your medical history, a physical exam, your age, your menstrual history, and your symptoms. Hormone tests may also bedone. How is this treated? In some cases, no treatment is needed. You and your health care provider should make a decision together about whether treatment is necessary. Treatment will be based on your individual condition and preferences. Treatment for this condition focuses on managing symptoms. Treatment may include: Menopausal hormone therapy (MHT). Medicines to treat specific symptoms or  complications. Acupuncture. Vitamin or herbal supplements. Before starting treatment, make sure to let your health care provider know if you have a personal or family history of these  conditions: Heart disease. Breast cancer. Blood clots. Diabetes. Osteoporosis. Follow these instructions at home: Lifestyle Do not use any products that contain nicotine or tobacco, such as cigarettes, e-cigarettes, and chewing tobacco. If you need help quitting, ask your health care provider. Get at least 30 minutes of physical activity on 5 or more days each week. Avoid alcoholic and caffeinated beverages, as well as spicy foods. This may help prevent hot flashes. Get 7-8 hours of sleep each night. If you have hot flashes, try: Dressing in layers. Avoiding things that may trigger hot flashes, such as spicy food, warm places, or stress. Taking slow, deep breaths when a hot flash starts. Keeping a fan in your home and office. Find ways to manage stress, such as deep breathing, meditation, or journaling. Consider going to group therapy with other women who are having menopause symptoms. Ask your health care provider about recommended group therapy meetings. Eating and drinking  Eat a healthy, balanced diet that contains whole grains, lean protein, low-fat dairy, and plenty of fruits and vegetables. Your health care provider may recommend adding more soy to your diet. Foods that contain soy include tofu, tempeh, and soy milk. Eat plenty of foods that contain calcium and vitamin D for bone health. Items that are rich in calcium include low-fat milk, yogurt, beans, almonds, sardines, broccoli, and kale.  Medicines Take over-the-counter and prescription medicines only as told by your health care provider. Talk with your health care provider before starting any herbal supplements. If prescribed, take vitamins and supplements as told by your health care provider. General instructions  Keep track of your menstrual periods, including: When they occur. How heavy they are and how long they last. How much time passes between periods. Keep track of your symptoms, noting when they start, how  often you have them, and how long they last. Use vaginal lubricants or moisturizers to help with vaginal dryness and improve comfort during sex. Keep all follow-up visits. This is important. This includes any group therapy or counseling.  Contact a health care provider if: You are still having menstrual periods after age 40. You have pain during sex. You have not had a period for 12 months and you develop vaginal bleeding. Get help right away if you have: Severe depression. Excessive vaginal bleeding. Pain when you urinate. A fast or irregular heartbeat (palpitations). Severe headaches. Abdominal pain or severe indigestion. Summary Menopause is a normal time of life when menstrual periods stop completely. It is usually defined as the absence of a menstrual period for 12 months without another medical cause. The transition to menopause (perimenopause) most often happens between the ages of 77 and 6 and can last for several years. Symptoms can be managed through medicines, lifestyle changes, and complementary therapies such as acupuncture. Eat a balanced diet that is rich in nutrients to promote bone health and heart health and to manage symptoms during menopause. This information is not intended to replace advice given to you by your health care provider. Make sure you discuss any questions you have with your healthcare provider. Document Revised: 09/14/2020 Document Reviewed: 05/31/2020 Elsevier Patient Education  [place].

## 2021-06-20 LAB — COMPREHENSIVE METABOLIC PANEL
ALT: 9 IU/L (ref 0–32)
AST: 12 IU/L (ref 0–40)
Albumin/Globulin Ratio: 1.1 — ABNORMAL LOW (ref 1.2–2.2)
Albumin: 3.8 g/dL (ref 3.8–4.8)
Alkaline Phosphatase: 106 IU/L (ref 44–121)
BUN/Creatinine Ratio: 13 (ref 9–23)
BUN: 10 mg/dL (ref 6–24)
Bilirubin Total: 0.3 mg/dL (ref 0.0–1.2)
CO2: 21 mmol/L (ref 20–29)
Calcium: 9.3 mg/dL (ref 8.7–10.2)
Chloride: 107 mmol/L — ABNORMAL HIGH (ref 96–106)
Creatinine, Ser: 0.78 mg/dL (ref 0.57–1.00)
Globulin, Total: 3.4 g/dL (ref 1.5–4.5)
Glucose: 112 mg/dL — ABNORMAL HIGH (ref 65–99)
Potassium: 3.6 mmol/L (ref 3.5–5.2)
Sodium: 143 mmol/L (ref 134–144)
Total Protein: 7.2 g/dL (ref 6.0–8.5)
eGFR: 94 mL/min/{1.73_m2} (ref >59)

## 2021-06-20 LAB — CBC
Hematocrit: 34.7 % (ref 34.0–46.6)
Hemoglobin: 11 g/dL — ABNORMAL LOW (ref 11.1–15.9)
MCH: 27.6 pg (ref 26.6–33.0)
MCHC: 31.7 g/dL (ref 31.5–35.7)
MCV: 87 fL (ref 79–97)
Platelets: 299 10*3/uL (ref 150–450)
RBC: 3.99 x10E6/uL (ref 3.77–5.28)
RDW: 14.2 % (ref 11.7–15.4)
WBC: 6.4 10*3/uL (ref 3.4–10.8)

## 2021-06-20 LAB — TSH: TSH: 0.917 u[IU]/mL (ref 0.450–4.500)

## 2021-06-24 ENCOUNTER — Other Ambulatory Visit: Payer: Self-pay | Admitting: Adult Health

## 2021-06-24 ENCOUNTER — Telehealth: Payer: Self-pay | Admitting: Adult Health

## 2021-06-24 ENCOUNTER — Encounter: Payer: Self-pay | Admitting: Adult Health

## 2021-06-24 DIAGNOSIS — A599 Trichomoniasis, unspecified: Secondary | ICD-10-CM

## 2021-06-24 DIAGNOSIS — R87810 Cervical high risk human papillomavirus (HPV) DNA test positive: Secondary | ICD-10-CM

## 2021-06-24 HISTORY — DX: Cervical high risk human papillomavirus (HPV) DNA test positive: R87.810

## 2021-06-24 HISTORY — DX: Trichomoniasis, unspecified: A59.9

## 2021-06-24 LAB — CYTOLOGY - PAP
Adequacy: ABSENT
Chlamydia: NEGATIVE
Comment: NEGATIVE
Comment: NEGATIVE
Comment: NEGATIVE
Comment: NORMAL
Diagnosis: NEGATIVE
HPV 16: NEGATIVE
HPV 18 / 45: NEGATIVE
High risk HPV: POSITIVE — AB
Neisseria Gonorrhea: NEGATIVE

## 2021-06-24 MED ORDER — METRONIDAZOLE 500 MG PO TABS: 500.0000 mg | ORAL_TABLET | Freq: Two times a day (BID) | ORAL | 0 refills | Status: DC

## 2021-06-24 NOTE — Telephone Encounter (Signed)
Reassured her, has appt July 6 will do POC then. Repeat pap in 1 year

## 2021-06-24 NOTE — Progress Notes (Signed)
Rx flagyl for trich on pap. Can treat partner will need his name

## 2021-06-25 ENCOUNTER — Ambulatory Visit (HOSPITAL_COMMUNITY): Admission: RE | Admit: 2021-06-25 | Payer: BC Managed Care – PPO | Source: Ambulatory Visit

## 2021-07-03 ENCOUNTER — Ambulatory Visit: Payer: BC Managed Care – PPO | Admitting: Adult Health

## 2021-07-04 ENCOUNTER — Other Ambulatory Visit: Payer: Self-pay

## 2021-07-04 ENCOUNTER — Ambulatory Visit (HOSPITAL_COMMUNITY)
Admission: RE | Admit: 2021-07-04 | Discharge: 2021-07-04 | Disposition: A | Discharge status: Home / Self Care | Payer: BC Managed Care – PPO | Source: Ambulatory Visit | Attending: Adult Health | Admitting: Adult Health

## 2021-07-04 DIAGNOSIS — D219 Benign neoplasm of connective and other soft tissue, unspecified: Secondary | ICD-10-CM | POA: Diagnosis present

## 2021-07-04 DIAGNOSIS — N921 Excessive and frequent menstruation with irregular cycle: Secondary | ICD-10-CM | POA: Diagnosis not present

## 2021-07-11 ENCOUNTER — Other Ambulatory Visit: Payer: Self-pay

## 2021-07-11 ENCOUNTER — Encounter: Payer: Self-pay | Admitting: Adult Health

## 2021-07-11 ENCOUNTER — Ambulatory Visit: Payer: BC Managed Care – PPO | Admitting: Adult Health

## 2021-07-11 ENCOUNTER — Other Ambulatory Visit (HOSPITAL_COMMUNITY)
Admission: RE | Admit: 2021-07-11 | Discharge: 2021-07-11 | Disposition: A | Discharge status: Home / Self Care | Payer: BC Managed Care – PPO | Source: Ambulatory Visit | Attending: Adult Health | Admitting: Adult Health

## 2021-07-11 VITALS — BP 120/87 | HR 78 | Ht 69.5 in | Wt 273.8 lb

## 2021-07-11 DIAGNOSIS — N921 Excessive and frequent menstruation with irregular cycle: Secondary | ICD-10-CM

## 2021-07-11 DIAGNOSIS — D219 Benign neoplasm of connective and other soft tissue, unspecified: Secondary | ICD-10-CM

## 2021-07-11 DIAGNOSIS — A599 Trichomoniasis, unspecified: Secondary | ICD-10-CM | POA: Diagnosis not present

## 2021-07-11 DIAGNOSIS — N951 Menopausal and female climacteric states: Secondary | ICD-10-CM | POA: Diagnosis not present

## 2021-07-11 DIAGNOSIS — R232 Flushing: Secondary | ICD-10-CM | POA: Insufficient documentation

## 2021-07-11 DIAGNOSIS — R1032 Left lower quadrant pain: Secondary | ICD-10-CM | POA: Insufficient documentation

## 2021-07-11 MED ORDER — LO LOESTRIN FE 1 MG-10 MCG / 10 MCG PO TABS: 1.0000 | ORAL_TABLET | Freq: Every day | ORAL | 0 refills | Status: DC

## 2021-07-11 NOTE — Progress Notes (Signed)
  Subjective:     Patient ID: Terri Wiley, female   DOB: 1976/09/03, 45 y.o.   MRN: [id number]  HPI Terri Wiley is a 45 year old black female, divorced, G5P5 back in follow up on [place] and had trich on pap, and was treated. Periods not regular, +hot flashes. Last period was heavy.  Lab Results  Component Value Date   DIAGPAP  06/19/2021    - Negative for intraepithelial lesion or malignancy (NILM)   HPVHIGH Positive (A) 06/19/2021   PCP is Dr Karie Kirks.   Review of Systems Periods irregular  Heavy period  +hot flashes Pain in left side at times Reviewed past medical,surgical, social and family history. Reviewed medications and allergies.     Objective:   Physical Exam BP 120/87 (BP Location: Right Arm, Patient Position: Sitting, Cuff Size: Large)   Pulse 78   Ht 5' 9.5" (1.765 m)   Wt 273 lb 12.8 oz (124.2 kg)   LMP 05/13/2021 (Approximate)   BMI 39.85 kg/m  Skin warm and dry. Lungs: clear to ausculation bilaterally. Cardiovascular: regular rate and rhythm.    Self swab for POC recent trich     Upstream - 07/11/21 1332       Pregnancy Intention Screening   Does the patient want to become pregnant in the next year? No    Does the patient's partner want to become pregnant in the next year? No    Would the patient like to discuss contraceptive options today? No      Contraception Wrap Up   Current Method Female Sterilization    End Method Female Sterilization    Contraception Counseling Provided No             Assessment:     1. Menorrhagia with irregular cycle Discussed trying low dose OCs, risk and benefits and she wants to try, start Sunday If any pain in chest or legs or to ER Meds ordered this encounter  Medications   Norethindrone-Ethinyl Estradiol-Fe Biphas (LO LOESTRIN FE) 1 MG-10 MCG / 10 MCG tablet    Sig: Take 1 tablet by mouth daily. Take 1 daily by mouth    Dispense:  84 tablet    Refill:  0    BIN [id number], PCN [id number], GRP [id number]    Order  Specific Question:   Supervising Provider    Answer:   Tania Ade H [[id number]]     2. Fibroids Multiple small fibroids on US,will just watch   3. Perimenopause Will try Lo Loestrin   4. Trichomoniasis CV swab for trich  5. LLQ pain May be hip related  6. Hot flashes Will try  Lo Loestrin     Plan:     Follow up in 10 weeks for ROS

## 2021-07-12 LAB — CERVICOVAGINAL ANCILLARY ONLY
Comment: NEGATIVE
Trichomonas: NEGATIVE

## 2021-09-18 ENCOUNTER — Ambulatory Visit: Payer: BC Managed Care – PPO | Admitting: Adult Health

## 2021-10-01 ENCOUNTER — Ambulatory Visit: Payer: BC Managed Care – PPO | Admitting: Cardiovascular Disease

## 2021-10-22 NOTE — Progress Notes (Deleted)
Cardiology Office Note    Date:  10/22/2021   ID:  Terri Wiley, DOB 02-Nov-1976, MRN [id number]   PCP:  Leslie Andrea, [place]  Cardiologist:  None *** Advanced Practice Provider:  No care team member to display Electrophysiologist:  None   [id number]}   No chief complaint on file.   History of Present Illness:  Terri Wiley is a 45 y.o. female who saw Dr. Johnsie Cancel 02/18/2021 for evaluation of chest pain going up her arm shoulders and neck worse with movement of her left arm.  CT negative for PE/dissection troponins were negative EKG had no acute change she was anemic with a hemoglobin of 10, lumbar spine films with scoliosis likely contributes to the pain.  Lexiscan 03/15/2021 low risk study no ischemia LVEF 58%.  2D echo 03/15/2021 normal LVEF 55 to 60%, small pericardial effusion trivial MR.    Past Medical History:  Diagnosis Date   GERD (gastroesophageal reflux disease)    Hernia cerebri ([place])    Papanicolaou smear of cervix with positive high risk human papilloma virus (HPV) test 06/24/2021   06/24/21 repeat pap in 1 year per [facility]P guidelines    Trichimoniasis 06/24/2021   Treated 06/24/21, POC___________    Past Surgical History:  Procedure Laterality Date   HERNIA REPAIR     tubiligation      Current Medications: No outpatient medications have been marked as taking for the 10/28/21 encounter (Appointment) with Imogene Burn, PA-C.     Allergies:   Bee venom and Penicillins   Social History   Socioeconomic History   Marital status: Divorced    Spouse name: Not on file   Number of children: Not on file   Years of education: Not on file   Highest education level: Not on file  Occupational History   Not on file  Tobacco Use   Smoking status: Never   Smokeless tobacco: Never  Vaping Use   Vaping Use: Never used  Substance and Sexual Activity   Alcohol use: No   Drug use: No   Sexual activity: Yes    Birth  control/protection: Surgical    Comment: tubal  Other Topics Concern   Not on file  Social History Narrative   Not on file   Social Determinants of Health   Financial Resource Strain: Medium Risk   Difficulty of Paying Living Expenses: Somewhat hard  Food Insecurity: No Food Insecurity   Worried About Running Out of Food in the Last Year: Never true   Ran Out of Food in the Last Year: Never true  Transportation Needs: No Transportation Needs   Lack of Transportation (Medical): No   Lack of Transportation (Non-Medical): No  Physical Activity: Insufficiently Active   Days of Exercise per Week: 2 days   Minutes of Exercise per Session: 30 min  Stress: Stress Concern Present   Feeling of Stress : To some extent  Social Connections: Moderately Isolated   Frequency of Communication with Friends and Family: Three times a week   Frequency of Social Gatherings with Friends and Family: Once a week   Attends Religious Services: 1 to 4 times per year   Active Member of [employer] or Organizations: No   Attends Music therapist: Never   Marital Status: Divorced     Family History:  The patient's ***family history includes Aneurysm in her father; Diabetes in her mother; Hypertension in her father and mother; Liver disease in her mother.  ROS:   Please see the history of present illness.    ROS All other systems reviewed and are negative.   PHYSICAL EXAM:   VS:  There were no vitals taken for this visit.  Physical Exam  GEN: Well nourished, well developed, in no acute distress  HEENT: normal  Neck: no JVD, carotid bruits, or masses Cardiac:RRR; no murmurs, rubs, or gallops  Respiratory:  clear to auscultation bilaterally, normal work of breathing GI: soft, nontender, nondistended, + BS Ext: without cyanosis, clubbing, or edema, Good distal pulses bilaterally MS: no deformity or atrophy  Skin: warm and dry, no rash Neuro:  Alert and Oriented x 3, Strength and sensation are  intact Psych: euthymic mood, full affect  Wt Readings from Last 3 Encounters:  07/11/21 273 lb 12.8 oz (124.2 kg)  06/19/21 275 lb (124.7 kg)  05/15/21 269 lb (122 kg)      Studies/Labs Reviewed:   EKG:  EKG is*** ordered today.  The ekg ordered today demonstrates ***  Recent Labs: 06/19/2021: ALT 9; BUN 10; Creatinine, Ser 0.78; Hemoglobin 11.0; Platelets 299; Potassium 3.6; Sodium 143; TSH 0.917   Lipid Panel No results found for: CHOL, TRIG, HDL, CHOLHDL, VLDL, LDLCALC, LDLDIRECT  Additional studies/ records that were reviewed today include:  2D echo 3/18/2022IMPRESSIONS     1. Left ventricular ejection fraction, by estimation, is 55 to 60%. The  left ventricle has normal function. The left ventricle has no regional  wall motion abnormalities. Left ventricular diastolic parameters are  indeterminate.   2. Right ventricular systolic function is normal. The right ventricular  size is normal. There is normal pulmonary artery systolic pressure. The  estimated right ventricular systolic pressure is 45.8 mmHg.   3. A small pericardial effusion is present. The pericardial effusion is  posterior to the left ventricle.   4. The mitral valve is grossly normal. Trivial mitral valve  regurgitation.   5. The aortic valve is tricuspid. Aortic valve regurgitation is not  visualized.   6. The inferior vena cava is normal in size with greater than 50%  respiratory variability, suggesting right atrial pressure of 3 mmHg.   Lexiscan 03/15/2021 Treadmill attempted but patient unable to achieve adequate heart rate response and there was substantial lead motion artifact making interpretation impossible. Study therefore switched to Lexiscan infusion. No diagnostic ST segment changes were noted. There were occasional to frequent PVCs noted in recovery including couplets. These are upright in the inferior leads suggesting possible outflow tract origin. No sustained arrhythmias. No significant  myocardial perfusion defects to indicate scar or ischemia. This is a low risk study. Nuclear stress EF: 58%.    Risk Assessment/Calculations:   {Does this patient have ATRIAL FIBRILLATION?[phone]}     ASSESSMENT:    1. Chest pain, unspecified type      PLAN:  In order of problems listed above:  Chest pain felt to be atypical for cardiac Lexiscan Myoview 03/15/2021 low risk no ischemia, 2D echo normal LVEF small pericardial effusion  Anemia  Shared Decision Making/Informed Consent   {Are you ordering a CV Procedure (e.g. stress test, cath, DCCV, TEE, etc)?   Press F2        :[id number]}    Medication Adjustments/Labs and Tests Ordered: Current medicines are reviewed at length with the patient today.  Concerns regarding medicines are outlined above.  Medication changes, Labs and Tests ordered today are listed in the Patient Instructions below. There are no Patient Instructions on file for this visit.  Sumner Boast, PA-C  10/22/2021 10:44 AM    [facility] [place], [place], [place]  [address] Phone: [phone]; Fax: [phone]

## 2021-10-28 ENCOUNTER — Ambulatory Visit: Payer: BC Managed Care – PPO | Admitting: Physician Assistant

## 2021-10-28 DIAGNOSIS — R079 Chest pain, unspecified: Secondary | ICD-10-CM

## 2021-11-14 ENCOUNTER — Emergency Department (HOSPITAL_COMMUNITY)
Admission: EM | Admit: 2021-11-14 | Discharge: 2021-11-14 | Disposition: A | Discharge status: Home / Self Care | Payer: BC Managed Care – PPO | Attending: Emergency Medicine | Admitting: Emergency Medicine

## 2021-11-14 ENCOUNTER — Encounter (HOSPITAL_COMMUNITY): Payer: Self-pay | Admitting: *Deleted

## 2021-11-14 DIAGNOSIS — Z7982 Long term (current) use of aspirin: Secondary | ICD-10-CM | POA: Diagnosis not present

## 2021-11-14 DIAGNOSIS — J0110 Acute frontal sinusitis, unspecified: Secondary | ICD-10-CM | POA: Insufficient documentation

## 2021-11-14 DIAGNOSIS — R519 Headache, unspecified: Secondary | ICD-10-CM | POA: Diagnosis present

## 2021-11-14 LAB — POC URINE PREG, ED: Preg Test, Ur: NEGATIVE

## 2021-11-14 MED ORDER — DOXYCYCLINE HYCLATE 100 MG PO CAPS: 100.0000 mg | ORAL_CAPSULE | Freq: Two times a day (BID) | ORAL | 0 refills | Status: DC

## 2021-11-14 MED ORDER — CLARITIN-D 12 HOUR 5-120 MG PO TB12: 1.0000 | ORAL_TABLET | Freq: Two times a day (BID) | ORAL | 0 refills | Status: DC

## 2021-11-14 MED ORDER — ACETAMINOPHEN 500 MG PO TABS
1000.0000 mg | ORAL_TABLET | Freq: Once | ORAL | Status: AC
  Administered 2021-11-14: 15:00:00 1000 mg via ORAL
  Filled 2021-11-14: qty 2

## 2021-11-14 NOTE — ED Provider Notes (Signed)
[facility] EMERGENCY DEPARTMENT Provider Note   CSN: [id number] Arrival date & time: 11/14/21  1152     History Chief Complaint  Patient presents with   Headache    Terri Wiley is a 45 y.o. female with history of GERD, denies history of hernia cerebrally as reflected in the chart, but states she has a hiatal hernia presenting for evaluation of suspected sinus infection.  She describes developing pain above her left eye last night, also describing sinus pressure, left ear pressure with muffled hearing in the left ear, denies nasal drainage but has had copious postnasal drip.  She denies fevers or chills, neck pain or stiffness, dizziness or focal weakness.  She does endorse sharp pain especially when she is breathing cold air through her nose, localizing to the left frontal region as well.  Denies injuries or falls.  She does have a history of sinus infections in the past.  She has been using ipratropium nasal spray which has been ineffective.  The history is provided by the patient.      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 06/24/2021   06/24/21 repeat pap in 1 year per [facility] guidelines    Trichimoniasis 06/24/2021   Treated 06/24/21, POC___________    Patient Active Problem List   Diagnosis Date Noted   LLQ pain 07/11/2021   Hot flashes 07/11/2021   Papanicolaou smear of cervix with positive high risk human papilloma virus (HPV) test 06/24/2021   Trichimoniasis 06/24/2021   Menorrhagia with irregular cycle 06/19/2021   Fibroids 06/19/2021   Perimenopause 06/19/2021   Routine Papanicolaou smear 06/19/2021   Chronic left hip pain 02/28/2021   Mass of left hip region 02/28/2021    Past Surgical History:  Procedure Laterality Date   HERNIA REPAIR     tubiligation       OB History     Gravida  5   Para  5   Term   5   Preterm      AB      Living  5      SAB      IAB      Ectopic      Multiple      Live Births  5           Family History  Problem Relation Age of Onset   Liver disease Mother    Hypertension Mother    Diabetes Mother    Aneurysm Father    Hypertension Father     Social History   Tobacco Use   Smoking status: Never   Smokeless tobacco: Never  Vaping Use   Vaping Use: Never used  Substance Use Topics   Alcohol use: No   Drug use: No    Home Medications Prior to Admission medications   Medication Sig Start Date End Date Taking? Authorizing Provider  acetaminophen (TYLENOL) 325 MG tablet Take 650 mg by mouth every 6 (six) hours as needed. pain   Yes [provider]  aspirin 81 MG EC tablet Take 81 mg by mouth daily. Swallow whole.   Yes [provider]  doxycycline (VIBRAMYCIN) 100 MG capsule Take 1 capsule (100 mg total) by mouth 2 (two) times daily. 11/14/21  Yes Donna Silverman, Almyra Free, PA-C  ibuprofen (ADVIL) 800 MG tablet Take 800 mg by mouth every 6 (six)  hours as needed. For pain   Yes [provider]  ipratropium (ATROVENT) 0.06 % nasal spray Place 2 sprays into both nostrils 3 (three) times daily. 09/10/21  Yes [provider]  loratadine-pseudoephedrine (CLARITIN-D 12 HOUR) 5-120 MG tablet Take 1 tablet by mouth 2 (two) times daily. 11/14/21  Yes Breniyah Romm, Almyra Free, PA-C  cefdinir (OMNICEF) 300 MG capsule Take 300 mg by mouth 2 (two) times daily. Patient not taking: Reported on 11/14/2021 12/18/20   [provider]  cetirizine (ZYRTEC) 10 MG tablet Take 10 mg by mouth daily. Patient not taking: Reported on 11/14/2021 01/31/21   [provider]  FEROSUL 325 (65 Fe) MG tablet Take 325 mg by mouth daily. Patient not taking: Reported on 11/14/2021 12/02/20   [provider]  furosemide (LASIX) 40 MG tablet Take 40 mg by mouth daily as needed for fluid or edema. Patient not taking: Reported on 11/14/2021  04/30/21   [provider]  LORazepam (ATIVAN) 0.5 MG tablet Take 0.5 mg by mouth daily as needed for anxiety. Patient not taking: Reported on 11/14/2021 10/08/20   [provider]  methocarbamol (ROBAXIN) 500 MG tablet Take 1 tablet (500 mg total) by mouth 2 (two) times daily. Patient not taking: Reported on 11/14/2021 05/15/21   Garald Balding, PA-C  metroNIDAZOLE (FLAGYL) 500 MG tablet Take 1 tablet (500 mg total) by mouth 2 (two) times daily. Patient not taking: Reported on 07/11/2021 06/24/21   Estill Dooms, NP  Norethindrone-Ethinyl Estradiol-Fe Biphas (LO LOESTRIN FE) 1 MG-10 MCG / 10 MCG tablet Take 1 tablet by mouth daily. Take 1 daily by mouth Patient not taking: Reported on 11/14/2021 07/11/21   Estill Dooms, NP    Allergies    Bee venom and Penicillins  Review of Systems   Review of Systems  Constitutional:  Negative for chills and fever.  HENT:  Positive for congestion, ear pain, postnasal drip, sinus pressure, sinus pain and sore throat. Negative for ear discharge, rhinorrhea, trouble swallowing and voice change.   Eyes:  Negative for discharge.  Respiratory:  Negative for cough, shortness of breath, wheezing and stridor.   Cardiovascular:  Negative for chest pain.  Gastrointestinal:  Negative for abdominal pain.  Genitourinary: Negative.   Musculoskeletal:  Negative for neck pain and neck stiffness.  All other systems reviewed and are negative.  Physical Exam Updated Vital Signs BP (!) 112/99 (BP Location: Right Arm)   Pulse (!) 59   Temp 98.3 F (36.8 C) (Oral)   Resp 20   Wt 121.7 kg   SpO2 99%   BMI 39.05 kg/m   Physical Exam Constitutional:      Appearance: She is well-developed.  HENT:     Head: Normocephalic and atraumatic.     Right Ear: Tympanic membrane and ear canal normal.     Left Ear: Ear canal normal. Tympanic membrane is retracted.     Nose: Mucosal edema present. No rhinorrhea.     Left Sinus: Frontal sinus  tenderness present.     Mouth/Throat:     Mouth: Mucous membranes are moist.     Pharynx: Oropharynx is clear. Uvula midline. No oropharyngeal exudate or posterior oropharyngeal erythema.     Tonsils: No tonsillar abscesses.  Eyes:     Extraocular Movements: Extraocular movements intact.     Conjunctiva/sclera: Conjunctivae normal.     Pupils: Pupils are equal, round, and reactive to light.  Cardiovascular:     Rate and Rhythm: Normal rate.  Heart sounds: Normal heart sounds.  Pulmonary:     Effort: Pulmonary effort is normal. No respiratory distress.     Breath sounds: No wheezing or rales.  Musculoskeletal:        General: Normal range of motion.     Cervical back: Normal range of motion.  Lymphadenopathy:     Cervical: No cervical adenopathy.  Skin:    General: Skin is warm and dry.     Findings: No rash.  Neurological:     General: No focal deficit present.     Mental Status: She is alert and oriented to person, place, and time.     Cranial Nerves: No cranial nerve deficit.     Sensory: No sensory deficit.     Motor: Motor function is intact. No weakness or pronator drift.     Coordination: Rapid alternating movements normal.     Gait: Gait is intact.    ED Results / Procedures / Treatments   Labs (all labs ordered are listed, but only abnormal results are displayed) Labs Reviewed  POC URINE PREG, ED    EKG None  Radiology No results found.  Procedures Procedures   Medications Ordered in ED Medications  acetaminophen (TYLENOL) tablet 1,000 mg (1,000 mg Oral Given 11/14/21 1440)    ED Course  I have reviewed the triage vital signs and the nursing notes.  Pertinent labs & imaging results that were available during my care of the patient were reviewed by me and considered in my medical decision making (see chart for details).    MDM Rules/Calculators/A&P                           Exam and history suggesting acute frontal sinusitis.  She was placed  on doxycycline, added Claritin-D, discussed other home treatments for symptom relief.  Plan follow-up with her PCP for recheck if symptoms persist or worsen. Final Clinical Impression(s) / ED Diagnoses Final diagnoses:  Acute non-recurrent frontal sinusitis    Rx / DC Orders ED Discharge Orders          Ordered    doxycycline (VIBRAMYCIN) 100 MG capsule  2 times daily        11/14/21 1508    loratadine-pseudoephedrine (CLARITIN-D 12 HOUR) 5-120 MG tablet  2 times daily        11/14/21 1508             Evalee Jefferson, PA-C 11/14/21 1517    Fredia Sorrow, MD 11/15/21 [phone]

## 2021-11-14 NOTE — ED Triage Notes (Signed)
Headache onset 0130 today

## 2021-11-14 NOTE — Discharge Instructions (Signed)
Take the entire course of the antibiotic prescribed.  I also would like you to take the Claritin-D which hopefully will help relieve the pressure in your ear and sinus region.  Also recommend warm moist compresses and steam therapy to your nose and sinuses which can also help to open your sinuses (sitting in a steamy bathroom or shower).  You may continue using your ibuprofen for pain relief as well.  Get rechecked if your symptoms are not improved with this treatment or you develop any new or worsening symptoms.

## 2021-12-02 NOTE — Progress Notes (Signed)
Cardiology Office Note    Date:  12/09/2021   ID:  Terri Wiley, DOB 02/03/1976, MRN [id number]   PCP:  Wendie Agreste, MD   [place]  Cardiologist:  Jenkins Rouge, MD  Advanced Practice Provider:  No care team member to display Electrophysiologist:  None   [phone]   No chief complaint on file.   History of Present Illness:  Terri Wiley is a 45 y.o. female with history of atypical chest pain seen by Dr. Johnsie Cancel 02/2021 2D echo normal LVEF 55 to 60% Lexiscan Myoview low risk study EF 58%.  Patient comes in because she has been having slight chest pain. Her 68 yo son died in 10-02-2023 and had an enlarged heart. Final autopsy not yet complete-waiting on 2 tests. Not sleeping, has a HH with GERD. Having sharp chest pain on occasion lasts a few minutes-occurs at rest and with exertion eases spontaneously. Went to urgent care for chest pain and BP elevated and given a BP med with anxiety med in it per patient and told to take if BP high but made her sleepy?ativan . BP running high.. Often has pain left neck not at same time but neck hurts worse-all the time. 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 Walks on a track twice a week-4-5 laps. Sometimes stops because she gets tired, not because of chest pain. . Sleeps propped up because she gets short of breath otherwise. Drinks a lot of coffee, sweet tea, gatorade, and V8 juices. Eats a lot of canned foods.     Past Medical History:  Diagnosis Date   GERD (gastroesophageal reflux disease)    Hernia cerebri ([place])    Papanicolaou smear of cervix with positive high risk human papilloma virus (HPV) test 06/24/2021   06/24/21 repeat pap in 1 year per [facility]P guidelines    Trichimoniasis 06/24/2021   Treated 06/24/21, POC___________    Past Surgical History:  Procedure Laterality Date   HERNIA REPAIR     tubiligation      Current Medications: Current Meds  Medication Sig   acetaminophen (TYLENOL) 325 MG  tablet Take 650 mg by mouth every 6 (six) hours as needed. pain   aspirin 81 MG EC tablet Take 81 mg by mouth daily. Swallow whole.   cetirizine (ZYRTEC) 10 MG tablet Take 10 mg by mouth daily.   FEROSUL 325 (65 Fe) MG tablet Take 325 mg by mouth daily.   furosemide (LASIX) 40 MG tablet Take 40 mg by mouth daily as needed for fluid or edema.   ibuprofen (ADVIL) 800 MG tablet Take 800 mg by mouth every 6 (six) hours as needed. For pain   ipratropium (ATROVENT) 0.06 % nasal spray Place 2 sprays into both nostrils 3 (three) times daily.   LORazepam (ATIVAN) 0.5 MG tablet Take 0.5 mg by mouth daily as needed for anxiety.   Norethindrone-Ethinyl Estradiol-Fe Biphas (LO LOESTRIN FE) 1 MG-10 MCG / 10 MCG tablet Take 1 tablet by mouth daily. Take 1 daily by mouth   pantoprazole (PROTONIX) 20 MG tablet Take 1 tablet (20 mg total) by mouth daily.     Allergies:   Bee venom and Penicillins   Social History   Socioeconomic History   Marital status: Divorced    Spouse name: Not on file   Number of children: Not on file   Years of education: Not on file   Highest education level: Not on file  Occupational History  Not on file  Tobacco Use   Smoking status: Never   Smokeless tobacco: Never  Vaping Use   Vaping Use: Never used  Substance and Sexual Activity   Alcohol use: No   Drug use: No   Sexual activity: Yes    Birth control/protection: Surgical    Comment: tubal  Other Topics Concern   Not on file  Social History Narrative   Not on file   Social Determinants of Health   Financial Resource Strain: Medium Risk   Difficulty of Paying Living Expenses: Somewhat hard  Food Insecurity: No Food Insecurity   Worried About Charity fundraiser in the Last Year: Never true   Ran Out of Food in the Last Year: Never true  Transportation Needs: No Transportation Needs   Lack of Transportation (Medical): No   Lack of Transportation (Non-Medical): No  Physical Activity: Insufficiently Active    Days of Exercise per Week: 2 days   Minutes of Exercise per Session: 30 min  Stress: Stress Concern Present   Feeling of Stress : To some extent  Social Connections: Moderately Isolated   Frequency of Communication with Friends and Family: Three times a week   Frequency of Social Gatherings with Friends and Family: Once a week   Attends Religious Services: 1 to 4 times per year   Active Member of [employer] or Organizations: No   Attends Music therapist: Never   Marital Status: Divorced     Family History:  The patient's  family history includes Aneurysm in her father; Diabetes in her mother; Hypertension in her father and mother; Liver disease in her mother.   ROS:   Please see the history of present illness.    ROS All other systems reviewed and are negative.   PHYSICAL EXAM:   VS:  BP 140/88   Pulse 69   Ht 5' 9.5" (1.765 m)   Wt 252 lb 3.2 oz (114.4 kg)   SpO2 97%   BMI 36.71 kg/m   Physical Exam  GEN: Obese, in no acute distress  Neck: tight muscles and tender to palpation left neck, no JVD, carotid bruits, or masses Cardiac:RRR; no murmurs, rubs, or gallops  Respiratory:  clear to auscultation bilaterally, normal work of breathing GI: soft, nontender, nondistended, + BS Ext: without cyanosis, clubbing, or edema, Good distal pulses bilaterally MS: no deformity or atrophy  Skin: warm and dry, no rash Neuro:  Alert and Oriented x 3,  Psych: euthymic mood, full affect  Wt Readings from Last 3 Encounters:  12/09/21 252 lb 3.2 oz (114.4 kg)  11/14/21 268 lb 4.8 oz (121.7 kg)  07/11/21 273 lb 12.8 oz (124.2 kg)      Studies/Labs Reviewed:   EKG:  EKG is not ordered today.    Recent Labs: 06/19/2021: ALT 9; BUN 10; Creatinine, Ser 0.78; Hemoglobin 11.0; Platelets 299; Potassium 3.6; Sodium 143; TSH 0.917   Lipid Panel No results found for: CHOL, TRIG, HDL, CHOLHDL, VLDL, LDLCALC, LDLDIRECT  Additional studies/ records that were reviewed today include:   2D echo 3/18/2022IMPRESSIONS     1. Left ventricular ejection fraction, by estimation, is 55 to 60%. The  left ventricle has normal function. The left ventricle has no regional  wall motion abnormalities. Left ventricular diastolic parameters are  indeterminate.   2. Right ventricular systolic function is normal. The right ventricular  size is normal. There is normal pulmonary artery systolic pressure. The  estimated right ventricular systolic pressure is 48.1 mmHg.  3. A small pericardial effusion is present. The pericardial effusion is  posterior to the left ventricle.   4. The mitral valve is grossly normal. Trivial mitral valve  regurgitation.   5. The aortic valve is tricuspid. Aortic valve regurgitation is not  visualized.   6. The inferior vena cava is normal in size with greater than 50%  respiratory variability, suggesting right atrial pressure of 3 mmHg.    Lexiscan Myoview 03/15/2021 Treadmill attempted but patient unable to achieve adequate heart rate response and there was substantial lead motion artifact making interpretation impossible. Study therefore switched to Lexiscan infusion. No diagnostic ST segment changes were noted. There were occasional to frequent PVCs noted in recovery including couplets. These are upright in the inferior leads suggesting possible outflow tract origin. No sustained arrhythmias. No significant myocardial perfusion defects to indicate scar or ischemia. This is a low risk study. Nuclear stress EF: 58%.    Risk Assessment/Calculations:         ASSESSMENT:    1. Other chest pain   2. Essential hypertension   3. Suspected sleep apnea   4. Neck pain   5. Obesity (BMI 35.0-39.9 without comorbidity)      PLAN:  In order of problems listed above:  Atypical chest pain with low risk Lexiscan Myoview 02/2021 normal LVEF on echo. Pain still atypical and recent stress and echo reassuring. A lot of stress from death of her son in 09-23-2023. Also  [place] with GERD symptoms-ran out of meds and transitioning PCP-can't be seen until end of Jan. Will give Protonix 20 mg until then  HTN-BP borderline, no LVH on echo. High salt diet-discussed 2 gm sodium diet. Order BP cuff and have her call if still high  Suspected sleep apnea-order sleep study  Neck pain-tender to touch, suspect M-S  Obestiy-mediterranean diet and exercise discussed  Shared Decision Making/Informed Consent        Medication Adjustments/Labs and Tests Ordered: Current medicines are reviewed at length with the patient today.  Concerns regarding medicines are outlined above.  Medication changes, Labs and Tests ordered today are listed in the Patient Instructions below. Patient Instructions  Medication Instructions:  Your physician has recommended you make the following change in your medication:  START Protonix 20 mg tablets daily for 6 weeks  *If you need a refill on your cardiac medications before your next appointment, please call your pharmacy*   Lab Work: None If you have labs (blood work) drawn today and your tests are completely normal, you will receive your results only by: [place] (if you have MyChart) OR A paper copy in the mail If you have any lab test that is abnormal or we need to change your treatment, we will call you to review the results.   Testing/Procedures: Your physician has recommended that you have a sleep study. This test records several body functions during sleep, including: brain activity, eye movement, oxygen and carbon dioxide blood levels, heart rate and rhythm, breathing rate and rhythm, the flow of air through your mouth and nose, snoring, body muscle movements, and chest and belly movement.    Follow-Up: At [facility], you and your health needs are our priority.  As part of our continuing mission to provide you with exceptional heart care, we have created designated Provider Care Teams.  These Care Teams include your  primary Cardiologist (physician) and Advanced Practice Providers (APPs -  Physician Assistants and Nurse Practitioners) who all work together to provide you with the  care you need, when you need it.  We recommend signing up for the patient portal called "MyChart".  Sign up information is provided on this After Visit Summary.  MyChart is used to connect with patients for Virtual Visits (Telemedicine).  Patients are able to view lab/test results, encounter notes, upcoming appointments, etc.  Non-urgent messages can be sent to your provider as well.   To learn more about what you can do with MyChart, go to [email or web address].    Your next appointment:   6 month(s)  The format for your next appointment:   In Person  Provider:   Jenkins Rouge, MD    Other Instructions Your provider recommends-   150 minutes of exercise per week STOP: Gatorade/Sweet Tea/V8 Juice      Two Gram Sodium Diet 2000 mg  What is Sodium? Sodium is a mineral found naturally in many foods. The most significant source of sodium in the diet is table salt, which is about 40% sodium.  Processed, convenience, and preserved foods also contain a large amount of sodium.  The body needs only 500 mg of sodium daily to function,  A normal diet provides more than enough sodium even if you do not use salt.  Why Limit Sodium? A build up of sodium in the body can cause thirst, increased blood pressure, shortness of breath, and water retention.  Decreasing sodium in the diet can reduce edema and risk of heart attack or stroke associated with high blood pressure.  Keep in mind that there are many other factors involved in these health problems.  Heredity, obesity, lack of exercise, cigarette smoking, stress and what you eat all play a role.  General Guidelines: Do not add salt at the table or in cooking.  One teaspoon of salt contains over 2 grams of sodium. Read food labels Avoid processed and convenience foods Ask your  dietitian before eating any foods not dicussed in the menu planning guidelines Consult your physician if you wish to use a salt substitute or a sodium containing medication such as antacids.  Limit milk and milk products to 16 oz (2 cups) per day.  Shopping Hints: READ LABELS!! "Dietetic" does not necessarily mean low sodium. Salt and other sodium ingredients are often added to foods during processing.    Menu Planning Guidelines Food Group Choose More Often Avoid  Beverages (see also the milk group All fruit juices, low-sodium, salt-free vegetables juices, low-sodium carbonated beverages Regular vegetable or tomato juices, commercially softened water used for drinking or cooking  Breads and Cereals Enriched white, wheat, rye and pumpernickel bread, hard rolls and dinner rolls; muffins, cornbread and waffles; most dry cereals, cooked cereal without added salt; unsalted crackers and breadsticks; low sodium or homemade bread crumbs Bread, rolls and crackers with salted tops; quick breads; instant hot cereals; pancakes; commercial bread stuffing; self-rising flower and biscuit mixes; regular bread crumbs or cracker crumbs  Desserts and Sweets Desserts and sweets mad with mild should be within allowance Instant pudding mixes and cake mixes  Fats Butter or margarine; vegetable oils; unsalted salad dressings, regular salad dressings limited to 1 Tbs; light, sour and heavy cream Regular salad dressings containing bacon fat, bacon bits, and salt pork; snack dips made with instant soup mixes or processed cheese; salted nuts  Fruits Most fresh, frozen and canned fruits Fruits processed with salt or sodium-containing ingredient (some dried fruits are processed with sodium sulfites        Vegetables Fresh, frozen vegetables and low- sodium canned  vegetables Regular canned vegetables, sauerkraut, pickled vegetables, and others prepared in brine; frozen vegetables in sauces; vegetables seasoned with ham,  bacon or salt pork  Condiments, Sauces, Miscellaneous  Salt substitute with physician's approval; pepper, herbs, spices; vinegar, lemon or lime juice; hot pepper sauce; garlic powder, onion powder, low sodium soy sauce (1 Tbs.); low sodium condiments (ketchup, chili sauce, mustard) in limited amounts (1 tsp.) fresh ground horseradish; unsalted tortilla chips, pretzels, potato chips, popcorn, salsa (1/4 cup) Any seasoning made with salt including garlic salt, celery salt, onion salt, and seasoned salt; sea salt, rock salt, kosher salt; meat tenderizers; monosodium glutamate; mustard, regular soy sauce, barbecue, sauce, chili sauce, teriyaki sauce, steak sauce, Worcestershire sauce, and most flavored vinegars; canned gravy and mixes; regular condiments; salted snack foods, olives, picles, relish, horseradish sauce, catsup   Food preparation: Try these seasonings Meats:    Pork Sage, onion Serve with applesauce  Chicken Poultry seasoning, thyme, parsley Serve with cranberry sauce  Lamb Curry powder, rosemary, garlic, thyme Serve with mint sauce or jelly  Veal Marjoram, basil Serve with current jelly, cranberry sauce  Beef Pepper, bay leaf Serve with dry mustard, unsalted chive butter  Fish [place] leaf, dill Serve with unsalted lemon butter, unsalted parsley butter  Vegetables:    Asparagus Lemon juice   Broccoli Lemon juice   Carrots Mustard dressing parsley, mint, nutmeg, glazed with unsalted butter and sugar   Green beans Marjoram, lemon juice, nutmeg,dill seed   Tomatoes Basil, marjoram, onion   Spice /blend for [employer]" 4 tsp ground thyme 1 tsp ground sage 3 tsp ground rosemary 4 tsp ground marjoram   Test your knowledge A product that says "Salt Free" may still contain sodium. True or False Garlic Powder and Hot Pepper Sauce an be used as alternative seasonings.True or False Processed foods have more sodium than fresh foods.  True or False Canned Vegetables have less sodium than froze  True or False   WAYS TO DECREASE YOUR SODIUM INTAKE Avoid the use of added salt in cooking and at the table.  Table salt (and other prepared seasonings which contain salt) is probably one of the greatest sources of sodium in the diet.  Unsalted foods can gain flavor from the sweet, sour, and butter taste sensations of herbs and spices.  Instead of using salt for seasoning, try the following seasonings with the foods listed.  Remember: how you use them to enhance natural food flavors is limited only by your creativity... Allspice-Meat, fish, eggs, fruit, peas, red and yellow vegetables Almond Extract-Fruit baked goods Anise Seed-Sweet breads, fruit, carrots, beets, cottage cheese, cookies (tastes like licorice) Basil-Meat, fish, eggs, vegetables, rice, vegetables salads, soups, sauces Bay Leaf-Meat, fish, stews, poultry Burnet-Salad, vegetables (cucumber-like flavor) Caraway Seed-Bread, cookies, cottage cheese, meat, vegetables, cheese, rice Cardamon-Baked goods, fruit, soups Celery Powder or seed-Salads, salad dressings, sauces, meatloaf, soup, bread.Do not use  celery salt Chervil-Meats, salads, fish, eggs, vegetables, cottage cheese (parsley-like flavor) Chili Power-Meatloaf, chicken cheese, corn, eggplant, egg dishes Chives-Salads cottage cheese, egg dishes, soups, vegetables, sauces Cilantro-Salsa, casseroles Cinnamon-Baked goods, fruit, pork, lamb, chicken, carrots Cloves-Fruit, baked goods, fish, pot roast, green beans, beets, carrots Coriander-Pastry, cookies, meat, salads, cheese (lemon-orange flavor) Cumin-Meatloaf, fish,cheese, eggs, cabbage,fruit pie (caraway flavor) [employer], fruit, eggs, fish, poultry, cottage cheese, vegetables Dill Seed-Meat, cottage cheese, poultry, vegetables, fish, salads, bread Fennel Seed-Bread, cookies, apples, pork, eggs, fish, beets, cabbage, cheese, Licorice-like flavor Garlic-(buds or powder) Salads, meat, poultry, fish, bread, butter,  vegetables, potatoes.Do not  use  garlic salt Ginger-Fruit, vegetables, baked goods, meat, fish, poultry Horseradish Root-Meet, vegetables, butter Lemon Juice or Extract-Vegetables, fruit, tea, baked goods, fish salads Mace-Baked goods fruit, vegetables, fish, poultry (taste like nutmeg) Maple Extract-Syrups Marjoram-Meat, chicken, fish, vegetables, breads, green salads (taste like Sage) Mint-Tea, lamb, sherbet, vegetables, desserts, carrots, cabbage Mustard, Dry or Seed-Cheese, eggs, meats, vegetables, poultry Nutmeg-Baked goods, fruit, chicken, eggs, vegetables, desserts Onion Powder-Meat, fish, poultry, vegetables, cheese, eggs, bread, rice salads (Do not use   Onion salt) Orange Extract-Desserts, baked goods Oregano-Pasta, eggs, cheese, onions, pork, lamb, fish, chicken, vegetables, green salads Paprika-Meat, fish, poultry, eggs, cheese, vegetables Parsley Flakes-Butter, vegetables, meat fish, poultry, eggs, bread, salads (certain forms may   Contain sodium Pepper-Meat fish, poultry, vegetables, eggs Peppermint Extract-Desserts, baked goods Poppy Seed-Eggs, bread, cheese, fruit dressings, baked goods, noodles, vegetables, cottage  [employer], poultry, meat, fish, cauliflower, turnips,eggs bread Saffron-Rice, bread, veal, chicken, fish, eggs Sage-Meat, fish, poultry, onions, eggplant, tomateos, pork, stews Savory-Eggs, salads, poultry, meat, rice, vegetables, soups, pork Tarragon-Meat, poultry, fish, eggs, butter, vegetables (licorice-like flavor)  Thyme-Meat, poultry, fish, eggs, vegetables, (clover-like flavor), sauces, soups Tumeric-Salads, butter, eggs, fish, rice, vegetables (saffron-like flavor) Vanilla Extract-Baked goods, candy Vinegar-Salads, vegetables, meat marinades Walnut Extract-baked goods, candy   2. Choose your Foods Wisely   The following is a list of foods to avoid which are high in sodium:  Meats-Avoid all smoked,  canned, salt cured, dried and kosher meat and fish as well as Anchovies   [employer] meats:Bologna, Liverwurst, Pastrami Canned meat or fish  Marinated herring Caviar    Pepperoni Corned Beef   Pizza Dried chipped beef  Salami Frozen breaded fish or meat Salt pork Frankfurters or hot dogs  Sardines Gefilte fish   Sausage Ham (boiled ham, Proscuitto Smoked butt    spiced ham)   Spam      TV Dinners Vegetables Canned vegetables (Regular) Relish Canned mushrooms  Sauerkraut Olives    Tomato juice Pickles  Bakery and Dessert Products Canned puddings  Cream pies Cheesecake   Decorated cakes Cookies  Beverages/Juices Tomato juice, regular  Gatorade   V-8 vegetable juice, regular  Breads and Cereals Biscuit mixes   Salted potato chips, corn chips, pretzels Bread stuffing mixes  Salted crackers and rolls Pancake and waffle mixes Self-rising flour  Seasonings Accent    Meat sauces Barbecue sauce  Meat tenderizer Catsup    Monosodium glutamate (MSG) Celery salt   Onion salt Chili sauce   Prepared mustard Garlic salt   Salt, seasoned salt, sea salt Gravy mixes   Soy sauce Horseradish   Steak sauce Ketchup   Tartar sauce Lite salt    Teriyaki sauce Marinade mixes   Worcestershire sauce  Others Baking powder   Cocoa and cocoa mixes Baking soda   Commercial casserole mixes Candy-caramels, chocolate  Dehydrated soups    Bars, fudge,nougats  Instant rice and pasta mixes Canned broth or soup  Maraschino cherries Cheese, aged and processed cheese and cheese spreads  Learning Assessment Quiz  Indicated T (for True) or F (for False) for each of the following statements:  _____ Fresh fruits and vegetables and unprocessed grains are generally low in sodium _____ Water may contain a considerable amount of sodium, depending on the source _____ You can always tell if a food is high in sodium by tasting it _____ Certain laxatives my be high in sodium and should be  avoided unless prescribed   by a physician or pharmacist _____ Consuello Closs  substitutes may be used freely by anyone on a sodium restricted diet _____ Sodium is present in table salt, food additives and as a natural component of   most foods _____ Table salt is approximately 90% sodium _____ Limiting sodium intake may help prevent excess fluid accumulation in the body _____ On a sodium-restricted diet, seasonings such as bouillon soy sauce, and    cooking wine should be used in place of table salt _____ On an ingredient list, a product which lists monosodium glutamate as the first   ingredient is an appropriate food to include on a low sodium diet  Circle the best answer(s) to the following statements (Hint: there may be more than one correct answer)  11. On a low-sodium diet, some acceptable snack items are:    A. Olives  F. Bean dip   K. Grapefruit juice    B. Salted Pretzels G. Commercial Popcorn   L. Canned peaches    C. Carrot Sticks  H. Bouillon   M. Unsalted nuts   D. [place] fries  I. Peanut butter crackers N. Salami   E. Sweet pickles J. Tomato Juice   O. Pizza  12.  Seasonings that may be used freely on a reduced - sodium diet include   A. Lemon wedges F.Monosodium glutamate K. Celery seed    B.Soysauce   G. Pepper   L. Mustard powder   C. Sea salt  H. Cooking wine  M. Onion flakes   D. Vinegar  E. Prepared horseradish N. Salsa   E. Sage   J. Worcestershire sauce  O. Chutney    Mediterranean Diet A Mediterranean diet refers to food and lifestyle choices that are based on the traditions of countries located on the [employer]. It focuses on eating more fruits, vegetables, whole grains, beans, nuts, seeds, and heart-healthy fats, and eating less dairy, meat, eggs, and processed foods with added sugar, salt, and fat. This way of eating has been shown to help prevent certain conditions and improve outcomes for people who have chronic diseases, like kidney disease and heart  disease. What are tips for following this plan? Reading food labels Check the serving size of packaged foods. For foods such as rice and pasta, the serving size refers to the amount of cooked product, not dry. Check the total fat in packaged foods. Avoid foods that have saturated fat or trans fats. Check the ingredient list for added sugars, such as corn syrup. Shopping  Buy a variety of foods that offer a balanced diet, including: Fresh fruits and vegetables (produce). Grains, beans, nuts, and seeds. Some of these may be available in unpackaged forms or large amounts (in bulk). Fresh seafood. Poultry and eggs. Low-fat dairy products. Buy whole ingredients instead of prepackaged foods. Buy fresh fruits and vegetables in-season from local farmers markets. Buy plain frozen fruits and vegetables. If you do not have access to quality fresh seafood, buy precooked frozen shrimp or canned fish, such as tuna, salmon, or sardines. Stock your pantry so you always have certain foods on hand, such as olive oil, canned tuna, canned tomatoes, rice, pasta, and beans. Cooking Cook foods with extra-virgin olive oil instead of using butter or other vegetable oils. Have meat as a side dish, and have vegetables or grains as your main dish. This means having meat in small portions or adding small amounts of meat to foods like pasta or stew. Use beans or vegetables instead of meat in common dishes like chili or lasagna. Experiment with  different cooking methods. Try roasting, broiling, steaming, and sauting vegetables. Add frozen vegetables to soups, stews, pasta, or rice. Add nuts or seeds for added healthy fats and plant protein at each meal. You can add these to yogurt, salads, or vegetable dishes. Marinate fish or vegetables using olive oil, lemon juice, garlic, and fresh herbs. Meal planning Plan to eat one vegetarian meal one day each week. Try to work up to two vegetarian meals, if possible. Eat  seafood two or more times a week. Have healthy snacks readily available, such as: Vegetable sticks with hummus. Greek yogurt. Fruit and nut trail mix. Eat balanced meals throughout the week. This includes: Fruit: 2-3 servings a day. Vegetables: 4-5 servings a day. Low-fat dairy: 2 servings a day. Fish, poultry, or lean meat: 1 serving a day. Beans and legumes: 2 or more servings a week. Nuts and seeds: 1-2 servings a day. Whole grains: 6-8 servings a day. Extra-virgin olive oil: 3-4 servings a day. Limit red meat and sweets to only a few servings a month. Lifestyle  Cook and eat meals together with your family, when possible. Drink enough fluid to keep your urine pale yellow. Be physically active every day. This includes: Aerobic exercise like running or swimming. Leisure activities like gardening, walking, or housework. Get 7-8 hours of sleep each night. If recommended by your health care provider, drink red wine in moderation. This means 1 glass a day for nonpregnant women and 2 glasses a day for men. A glass of wine equals 5 oz (150 mL). What foods should I eat? Fruits Apples. Apricots. Avocado. Berries. Bananas. Cherries. Dates. Figs. Grapes. Lemons. Melon. Oranges. Peaches. Plums. Pomegranate. Vegetables Artichokes. Beets. Broccoli. Cabbage. Carrots. Eggplant. Green beans. Chard. Kale. Spinach. Onions. Leeks. Peas. Squash. Tomatoes. Peppers. Radishes. Grains Whole-grain pasta. Brown rice. Bulgur wheat. Polenta. Couscous. Whole-wheat bread. [place]. Meats and other proteins Beans. Almonds. Sunflower seeds. Pine nuts. Peanuts. Avilla. Salmon. Scallops. Shrimp. [place]. Tilapia. Clams. Oysters. Eggs. Poultry without skin. Dairy Low-fat milk. Cheese. Greek yogurt. Fats and oils Extra-virgin olive oil. Avocado oil. Grapeseed oil. Beverages Water. Red wine. Herbal tea. Sweets and desserts Greek yogurt with honey. Baked apples. Poached pears. Trail mix. Seasonings and  condiments Basil. Cilantro. Coriander. Cumin. Mint. Parsley. Sage. Rosemary. Tarragon. Garlic. Oregano. Thyme. Pepper. Balsamic vinegar. Tahini. Hummus. Tomato sauce. Olives. Mushrooms. The items listed above may not be a complete list of foods and beverages you can eat. Contact a dietitian for more information. What foods should I limit? This is a list of foods that should be eaten rarely or only on special occasions. Fruits Fruit canned in syrup. Vegetables Deep-fried potatoes (french fries). Grains Prepackaged pasta or rice dishes. Prepackaged cereal with added sugar. Prepackaged snacks with added sugar. Meats and other proteins Beef. Pork. Lamb. Poultry with skin. Hot dogs. Berniece Salines. Dairy Ice cream. Sour cream. Whole milk. Fats and oils Butter. Canola oil. Vegetable oil. Beef fat (tallow). Lard. Beverages Juice. Sugar-sweetened soft drinks. Beer. Liquor and spirits. Sweets and desserts Cookies. Cakes. Pies. Candy. Seasonings and condiments Mayonnaise. Pre-made sauces and marinades. The items listed above may not be a complete list of foods and beverages you should limit. Contact a dietitian for more information. Summary The Mediterranean diet includes both food and lifestyle choices. Eat a variety of fresh fruits and vegetables, beans, nuts, seeds, and whole grains. Limit the amount of red meat and sweets that you eat. If recommended by your health care provider, drink red wine in moderation. This means 1 glass  a day for nonpregnant women and 2 glasses a day for men. A glass of wine equals 5 oz (150 mL). This information is not intended to replace advice given to you by your health care provider. Make sure you discuss any questions you have with your health care provider. Document Revised: 01/20/2020 Document Reviewed: 11/17/2019 Elsevier Patient Education  2022 [place], Ermalinda Barrios, [place]  12/09/2021 1:11 PM    [facility] [place], [place], [place]  [address] Phone: [phone]; Fax: [phone]

## 2021-12-09 ENCOUNTER — Other Ambulatory Visit: Payer: Self-pay

## 2021-12-09 ENCOUNTER — Ambulatory Visit (INDEPENDENT_AMBULATORY_CARE_PROVIDER_SITE_OTHER): Payer: BC Managed Care – PPO | Admitting: Physician Assistant

## 2021-12-09 ENCOUNTER — Encounter: Payer: Self-pay | Admitting: Physician Assistant

## 2021-12-09 VITALS — BP 140/88 | HR 69 | Ht 69.5 in | Wt 252.2 lb

## 2021-12-09 DIAGNOSIS — R0789 Other chest pain: Secondary | ICD-10-CM

## 2021-12-09 DIAGNOSIS — R29818 Other symptoms and signs involving the nervous system: Secondary | ICD-10-CM

## 2021-12-09 DIAGNOSIS — E669 Obesity, unspecified: Secondary | ICD-10-CM

## 2021-12-09 DIAGNOSIS — I10 Essential (primary) hypertension: Secondary | ICD-10-CM

## 2021-12-09 DIAGNOSIS — M542 Cervicalgia: Secondary | ICD-10-CM | POA: Diagnosis not present

## 2021-12-09 MED ORDER — PANTOPRAZOLE SODIUM 20 MG PO TBEC: 20.0000 mg | DELAYED_RELEASE_TABLET | Freq: Every day | ORAL | 0 refills | Status: DC

## 2021-12-09 NOTE — Patient Instructions (Addendum)
Medication Instructions:  Your physician has recommended you make the following change in your medication:  START Protonix 20 mg tablets daily for 6 weeks  *If you need a refill on your cardiac medications before your next appointment, please call your pharmacy*   Lab Work: None If you have labs (blood work) drawn today and your tests are completely normal, you will receive your results only by: [place] (if you have MyChart) OR A paper copy in the mail If you have any lab test that is abnormal or we need to change your treatment, we will call you to review the results.   Testing/Procedures: Your physician has recommended that you have a sleep study. This test records several body functions during sleep, including: brain activity, eye movement, oxygen and carbon dioxide blood levels, heart rate and rhythm, breathing rate and rhythm, the flow of air through your mouth and nose, snoring, body muscle movements, and chest and belly movement.    Follow-Up: At [facility], you and your health needs are our priority.  As part of our continuing mission to provide you with exceptional heart care, we have created designated Provider Care Teams.  These Care Teams include your primary Cardiologist (physician) and Advanced Practice Providers (APPs -  Physician Assistants and Nurse Practitioners) who all work together to provide you with the care you need, when you need it.  We recommend signing up for the patient portal called "MyChart".  Sign up information is provided on this After Visit Summary.  MyChart is used to connect with patients for Virtual Visits (Telemedicine).  Patients are able to view lab/test results, encounter notes, upcoming appointments, etc.  Non-urgent messages can be sent to your provider as well.   To learn more about what you can do with MyChart, go to [email or web address].    Your next appointment:   6 month(s)  The format for your next appointment:   In  Person  Provider:   Jenkins Rouge, MD    Other Instructions Your provider recommends-   150 minutes of exercise per week STOP: Gatorade/Sweet Tea/V8 Juice      Two Gram Sodium Diet 2000 mg  What is Sodium? Sodium is a mineral found naturally in many foods. The most significant source of sodium in the diet is table salt, which is about 40% sodium.  Processed, convenience, and preserved foods also contain a large amount of sodium.  The body needs only 500 mg of sodium daily to function,  A normal diet provides more than enough sodium even if you do not use salt.  Why Limit Sodium? A build up of sodium in the body can cause thirst, increased blood pressure, shortness of breath, and water retention.  Decreasing sodium in the diet can reduce edema and risk of heart attack or stroke associated with high blood pressure.  Keep in mind that there are many other factors involved in these health problems.  Heredity, obesity, lack of exercise, cigarette smoking, stress and what you eat all play a role.  General Guidelines: Do not add salt at the table or in cooking.  One teaspoon of salt contains over 2 grams of sodium. Read food labels Avoid processed and convenience foods Ask your dietitian before eating any foods not dicussed in the menu planning guidelines Consult your physician if you wish to use a salt substitute or a sodium containing medication such as antacids.  Limit milk and milk products to 16 oz (2 cups) per day.  Shopping Hints: READ  LABELS!! "Dietetic" does not necessarily mean low sodium. Salt and other sodium ingredients are often added to foods during processing.    Menu Planning Guidelines Food Group Choose More Often Avoid  Beverages (see also the milk group All fruit juices, low-sodium, salt-free vegetables juices, low-sodium carbonated beverages Regular vegetable or tomato juices, commercially softened water used for drinking or cooking  Breads and Cereals Enriched  white, wheat, rye and pumpernickel bread, hard rolls and dinner rolls; muffins, cornbread and waffles; most dry cereals, cooked cereal without added salt; unsalted crackers and breadsticks; low sodium or homemade bread crumbs Bread, rolls and crackers with salted tops; quick breads; instant hot cereals; pancakes; commercial bread stuffing; self-rising flower and biscuit mixes; regular bread crumbs or cracker crumbs  Desserts and Sweets Desserts and sweets mad with mild should be within allowance Instant pudding mixes and cake mixes  Fats Butter or margarine; vegetable oils; unsalted salad dressings, regular salad dressings limited to 1 Tbs; light, sour and heavy cream Regular salad dressings containing bacon fat, bacon bits, and salt pork; snack dips made with instant soup mixes or processed cheese; salted nuts  Fruits Most fresh, frozen and canned fruits Fruits processed with salt or sodium-containing ingredient (some dried fruits are processed with sodium sulfites        Vegetables Fresh, frozen vegetables and low- sodium canned vegetables Regular canned vegetables, sauerkraut, pickled vegetables, and others prepared in brine; frozen vegetables in sauces; vegetables seasoned with ham, bacon or salt pork  Condiments, Sauces, Miscellaneous  Salt substitute with physician's approval; pepper, herbs, spices; vinegar, lemon or lime juice; hot pepper sauce; garlic powder, onion powder, low sodium soy sauce (1 Tbs.); low sodium condiments (ketchup, chili sauce, mustard) in limited amounts (1 tsp.) fresh ground horseradish; unsalted tortilla chips, pretzels, potato chips, popcorn, salsa (1/4 cup) Any seasoning made with salt including garlic salt, celery salt, onion salt, and seasoned salt; sea salt, rock salt, kosher salt; meat tenderizers; monosodium glutamate; mustard, regular soy sauce, barbecue, sauce, chili sauce, teriyaki sauce, steak sauce, Worcestershire sauce, and most flavored vinegars; canned gravy  and mixes; regular condiments; salted snack foods, olives, picles, relish, horseradish sauce, catsup   Food preparation: Try these seasonings Meats:    Pork Sage, onion Serve with applesauce  Chicken Poultry seasoning, thyme, parsley Serve with cranberry sauce  Lamb Curry powder, rosemary, garlic, thyme Serve with mint sauce or jelly  Veal Marjoram, basil Serve with current jelly, cranberry sauce  Beef Pepper, bay leaf Serve with dry mustard, unsalted chive butter  Fish [place] leaf, dill Serve with unsalted lemon butter, unsalted parsley butter  Vegetables:    Asparagus Lemon juice   Broccoli Lemon juice   Carrots Mustard dressing parsley, mint, nutmeg, glazed with unsalted butter and sugar   Green beans Marjoram, lemon juice, nutmeg,dill seed   Tomatoes Basil, marjoram, onion   Spice /blend for [employer]" 4 tsp ground thyme 1 tsp ground sage 3 tsp ground rosemary 4 tsp ground marjoram   Test your knowledge A product that says "Salt Free" may still contain sodium. True or False Garlic Powder and Hot Pepper Sauce an be used as alternative seasonings.True or False Processed foods have more sodium than fresh foods.  True or False Canned Vegetables have less sodium than froze True or False   WAYS TO DECREASE YOUR SODIUM INTAKE Avoid the use of added salt in cooking and at the table.  Table salt (and other prepared seasonings which contain salt) is probably one of the greatest  sources of sodium in the diet.  Unsalted foods can gain flavor from the sweet, sour, and butter taste sensations of herbs and spices.  Instead of using salt for seasoning, try the following seasonings with the foods listed.  Remember: how you use them to enhance natural food flavors is limited only by your creativity... Allspice-Meat, fish, eggs, fruit, peas, red and yellow vegetables Almond Extract-Fruit baked goods Anise Seed-Sweet breads, fruit, carrots, beets, cottage cheese, cookies (tastes like  licorice) Basil-Meat, fish, eggs, vegetables, rice, vegetables salads, soups, sauces Bay Leaf-Meat, fish, stews, poultry Burnet-Salad, vegetables (cucumber-like flavor) Caraway Seed-Bread, cookies, cottage cheese, meat, vegetables, cheese, rice Cardamon-Baked goods, fruit, soups Celery Powder or seed-Salads, salad dressings, sauces, meatloaf, soup, bread.Do not use  celery salt Chervil-Meats, salads, fish, eggs, vegetables, cottage cheese (parsley-like flavor) Chili Power-Meatloaf, chicken cheese, corn, eggplant, egg dishes Chives-Salads cottage cheese, egg dishes, soups, vegetables, sauces Cilantro-Salsa, casseroles Cinnamon-Baked goods, fruit, pork, lamb, chicken, carrots Cloves-Fruit, baked goods, fish, pot roast, green beans, beets, carrots Coriander-Pastry, cookies, meat, salads, cheese (lemon-orange flavor) Cumin-Meatloaf, fish,cheese, eggs, cabbage,fruit pie (caraway flavor) [employer], fruit, eggs, fish, poultry, cottage cheese, vegetables Dill Seed-Meat, cottage cheese, poultry, vegetables, fish, salads, bread Fennel Seed-Bread, cookies, apples, pork, eggs, fish, beets, cabbage, cheese, Licorice-like flavor Garlic-(buds or powder) Salads, meat, poultry, fish, bread, butter, vegetables, potatoes.Do not  use garlic salt Ginger-Fruit, vegetables, baked goods, meat, fish, poultry Horseradish Root-Meet, vegetables, butter Lemon Juice or Extract-Vegetables, fruit, tea, baked goods, fish salads Mace-Baked goods fruit, vegetables, fish, poultry (taste like nutmeg) Maple Extract-Syrups Marjoram-Meat, chicken, fish, vegetables, breads, green salads (taste like Sage) Mint-Tea, lamb, sherbet, vegetables, desserts, carrots, cabbage Mustard, Dry or Seed-Cheese, eggs, meats, vegetables, poultry Nutmeg-Baked goods, fruit, chicken, eggs, vegetables, desserts Onion Powder-Meat, fish, poultry, vegetables, cheese, eggs, bread, rice salads (Do not use   Onion salt) Orange Extract-Desserts,  baked goods Oregano-Pasta, eggs, cheese, onions, pork, lamb, fish, chicken, vegetables, green salads Paprika-Meat, fish, poultry, eggs, cheese, vegetables Parsley Flakes-Butter, vegetables, meat fish, poultry, eggs, bread, salads (certain forms may   Contain sodium Pepper-Meat fish, poultry, vegetables, eggs Peppermint Extract-Desserts, baked goods Poppy Seed-Eggs, bread, cheese, fruit dressings, baked goods, noodles, vegetables, cottage  [employer], poultry, meat, fish, cauliflower, turnips,eggs bread Saffron-Rice, bread, veal, chicken, fish, eggs Sage-Meat, fish, poultry, onions, eggplant, tomateos, pork, stews Savory-Eggs, salads, poultry, meat, rice, vegetables, soups, pork Tarragon-Meat, poultry, fish, eggs, butter, vegetables (licorice-like flavor)  Thyme-Meat, poultry, fish, eggs, vegetables, (clover-like flavor), sauces, soups Tumeric-Salads, butter, eggs, fish, rice, vegetables (saffron-like flavor) Vanilla Extract-Baked goods, candy Vinegar-Salads, vegetables, meat marinades Walnut Extract-baked goods, candy   2. Choose your Foods Wisely   The following is a list of foods to avoid which are high in sodium:  Meats-Avoid all smoked, canned, salt cured, dried and kosher meat and fish as well as Anchovies   [employer] meats:Bologna, Liverwurst, Pastrami Canned meat or fish  Marinated herring Caviar    Pepperoni Corned Beef   Pizza Dried chipped beef  Salami Frozen breaded fish or meat Salt pork Frankfurters or hot dogs  Sardines Gefilte fish   Sausage Ham (boiled ham, Proscuitto Smoked butt    spiced ham)   Spam      TV Dinners Vegetables Canned vegetables (Regular) Relish Canned mushrooms  Sauerkraut Olives    Tomato juice Pickles  Bakery and Dessert Products Canned puddings  Cream pies Cheesecake   Decorated cakes Cookies  Beverages/Juices Tomato juice, regular  Gatorade   V-8 vegetable juice,  regular  Breads and Cereals Biscuit mixes   Salted potato chips, corn chips, pretzels Bread stuffing mixes  Salted crackers and rolls Pancake and waffle mixes Self-rising flour  Seasonings Accent    Meat sauces Barbecue sauce  Meat tenderizer Catsup    Monosodium glutamate (MSG) Celery salt   Onion salt Chili sauce   Prepared mustard Garlic salt   Salt, seasoned salt, sea salt Gravy mixes   Soy sauce Horseradish   Steak sauce Ketchup   [place] sauce Lite salt    Teriyaki sauce Marinade mixes   Worcestershire sauce  Others Baking powder   Cocoa and cocoa mixes Baking soda   Commercial casserole mixes Candy-caramels, chocolate  Dehydrated soups    Bars, fudge,nougats  Instant rice and pasta mixes Canned broth or soup  Maraschino cherries Cheese, aged and processed cheese and cheese spreads  Learning Assessment Quiz  Indicated T (for True) or F (for False) for each of the following statements:  _____ Fresh fruits and vegetables and unprocessed grains are generally low in sodium _____ Water may contain a considerable amount of sodium, depending on the source _____ You can always tell if a food is high in sodium by tasting it _____ Certain laxatives my be high in sodium and should be avoided unless prescribed   by a physician or pharmacist _____ Salt substitutes may be used freely by anyone on a sodium restricted diet _____ Sodium is present in table salt, food additives and as a natural component of   most foods _____ Table salt is approximately 90% sodium _____ Limiting sodium intake may help prevent excess fluid accumulation in the body _____ On a sodium-restricted diet, seasonings such as bouillon soy sauce, and    cooking wine should be used in place of table salt _____ On an ingredient list, a product which lists monosodium glutamate as the first   ingredient is an appropriate food to include on a low sodium diet  Circle the best answer(s) to the following statements (Hint:  there may be more than one correct answer)  11. On a low-sodium diet, some acceptable snack items are:    A. Olives  F. Bean dip   K. Grapefruit juice    B. Salted Pretzels G. Commercial Popcorn   L. Canned peaches    C. Carrot Sticks  H. Bouillon   M. Unsalted nuts   D. [place] fries  I. Peanut butter crackers N. Salami   E. Sweet pickles J. Tomato Juice   O. Pizza  12.  Seasonings that may be used freely on a reduced - sodium diet include   A. Lemon wedges F.Monosodium glutamate K. Celery seed    B.Soysauce   G. Pepper   L. Mustard powder   C. Sea salt  H. Cooking wine  M. Onion flakes   D. Vinegar  E. Prepared horseradish N. Salsa   E. Sage   J. Worcestershire sauce  O. Chutney    Mediterranean Diet A Mediterranean diet refers to food and lifestyle choices that are based on the traditions of countries located on the [employer]. It focuses on eating more fruits, vegetables, whole grains, beans, nuts, seeds, and heart-healthy fats, and eating less dairy, meat, eggs, and processed foods with added sugar, salt, and fat. This way of eating has been shown to help prevent certain conditions and improve outcomes for people who have chronic diseases, like kidney disease and heart disease. What are tips for following this plan? Reading food labels Check the serving  size of packaged foods. For foods such as rice and pasta, the serving size refers to the amount of cooked product, not dry. Check the total fat in packaged foods. Avoid foods that have saturated fat or trans fats. Check the ingredient list for added sugars, such as corn syrup. Shopping  Buy a variety of foods that offer a balanced diet, including: Fresh fruits and vegetables (produce). Grains, beans, nuts, and seeds. Some of these may be available in unpackaged forms or large amounts (in bulk). Fresh seafood. Poultry and eggs. Low-fat dairy products. Buy whole ingredients instead of prepackaged foods. Buy fresh  fruits and vegetables in-season from local farmers markets. Buy plain frozen fruits and vegetables. If you do not have access to quality fresh seafood, buy precooked frozen shrimp or canned fish, such as tuna, salmon, or sardines. Stock your pantry so you always have certain foods on hand, such as olive oil, canned tuna, canned tomatoes, rice, pasta, and beans. Cooking Cook foods with extra-virgin olive oil instead of using butter or other vegetable oils. Have meat as a side dish, and have vegetables or grains as your main dish. This means having meat in small portions or adding small amounts of meat to foods like pasta or stew. Use beans or vegetables instead of meat in common dishes like chili or lasagna. Experiment with different cooking methods. Try roasting, broiling, steaming, and sauting vegetables. Add frozen vegetables to soups, stews, pasta, or rice. Add nuts or seeds for added healthy fats and plant protein at each meal. You can add these to yogurt, salads, or vegetable dishes. Marinate fish or vegetables using olive oil, lemon juice, garlic, and fresh herbs. Meal planning Plan to eat one vegetarian meal one day each week. Try to work up to two vegetarian meals, if possible. Eat seafood two or more times a week. Have healthy snacks readily available, such as: Vegetable sticks with hummus. Greek yogurt. Fruit and nut trail mix. Eat balanced meals throughout the week. This includes: Fruit: 2-3 servings a day. Vegetables: 4-5 servings a day. Low-fat dairy: 2 servings a day. Fish, poultry, or lean meat: 1 serving a day. Beans and legumes: 2 or more servings a week. Nuts and seeds: 1-2 servings a day. Whole grains: 6-8 servings a day. Extra-virgin olive oil: 3-4 servings a day. Limit red meat and sweets to only a few servings a month. Lifestyle  Cook and eat meals together with your family, when possible. Drink enough fluid to keep your urine pale yellow. Be physically active  every day. This includes: Aerobic exercise like running or swimming. Leisure activities like gardening, walking, or housework. Get 7-8 hours of sleep each night. If recommended by your health care provider, drink red wine in moderation. This means 1 glass a day for nonpregnant women and 2 glasses a day for men. A glass of wine equals 5 oz (150 mL). What foods should I eat? Fruits Apples. Apricots. Avocado. Berries. Bananas. Cherries. Dates. Figs. Grapes. Lemons. Melon. Oranges. Peaches. Plums. Pomegranate. Vegetables Artichokes. Beets. Broccoli. Cabbage. Carrots. Eggplant. Green beans. Chard. Kale. Spinach. Onions. Leeks. Peas. Squash. Tomatoes. Peppers. Radishes. Grains Whole-grain pasta. Brown rice. Bulgur wheat. Polenta. Couscous. Whole-wheat bread. [place]. Meats and other proteins Beans. Almonds. Sunflower seeds. Pine nuts. Peanuts. [place]. Salmon. Scallops. Shrimp. [place]. Tilapia. Clams. Oysters. Eggs. Poultry without skin. Dairy Low-fat milk. Cheese. Greek yogurt. Fats and oils Extra-virgin olive oil. Avocado oil. Grapeseed oil. Beverages Water. Red wine. Herbal tea. Sweets and desserts Greek yogurt with honey. Baked  apples. Poached pears. Trail mix. Seasonings and condiments Basil. Cilantro. Coriander. Cumin. Mint. Parsley. Sage. Rosemary. Tarragon. Garlic. Oregano. Thyme. Pepper. Balsamic vinegar. Tahini. Hummus. Tomato sauce. Olives. Mushrooms. The items listed above may not be a complete list of foods and beverages you can eat. Contact a dietitian for more information. What foods should I limit? This is a list of foods that should be eaten rarely or only on special occasions. Fruits Fruit canned in syrup. Vegetables Deep-fried potatoes (french fries). Grains Prepackaged pasta or rice dishes. Prepackaged cereal with added sugar. Prepackaged snacks with added sugar. Meats and other proteins Beef. Pork. Lamb. Poultry with skin. Hot dogs. Berniece Salines. Dairy Ice cream. Sour  cream. Whole milk. Fats and oils Butter. Canola oil. Vegetable oil. Beef fat (tallow). Lard. Beverages Juice. Sugar-sweetened soft drinks. Beer. Liquor and spirits. Sweets and desserts Cookies. Cakes. Pies. Candy. Seasonings and condiments Mayonnaise. Pre-made sauces and marinades. The items listed above may not be a complete list of foods and beverages you should limit. Contact a dietitian for more information. Summary The Mediterranean diet includes both food and lifestyle choices. Eat a variety of fresh fruits and vegetables, beans, nuts, seeds, and whole grains. Limit the amount of red meat and sweets that you eat. If recommended by your health care provider, drink red wine in moderation. This means 1 glass a day for nonpregnant women and 2 glasses a day for men. A glass of wine equals 5 oz (150 mL). This information is not intended to replace advice given to you by your health care provider. Make sure you discuss any questions you have with your health care provider. Document Revised: 01/20/2020 Document Reviewed: 11/17/2019 Elsevier Patient Education  2022 [employer].

## 2022-01-23 ENCOUNTER — Telehealth: Payer: Self-pay | Admitting: *Deleted

## 2022-01-23 ENCOUNTER — Ambulatory Visit: Payer: BC Managed Care – PPO | Admitting: Family Medicine

## 2022-01-23 ENCOUNTER — Encounter: Payer: Self-pay | Admitting: Family Medicine

## 2022-01-23 ENCOUNTER — Other Ambulatory Visit: Payer: Self-pay

## 2022-01-23 VITALS — BP 144/76 | HR 67 | Temp 98.2°F | Resp 18 | Ht 69.5 in | Wt 270.0 lb

## 2022-01-23 DIAGNOSIS — Z1211 Encounter for screening for malignant neoplasm of colon: Secondary | ICD-10-CM

## 2022-01-23 DIAGNOSIS — R079 Chest pain, unspecified: Secondary | ICD-10-CM

## 2022-01-23 DIAGNOSIS — M2550 Pain in unspecified joint: Secondary | ICD-10-CM

## 2022-01-23 DIAGNOSIS — R0789 Other chest pain: Secondary | ICD-10-CM

## 2022-01-23 DIAGNOSIS — K219 Gastro-esophageal reflux disease without esophagitis: Secondary | ICD-10-CM

## 2022-01-23 DIAGNOSIS — K449 Diaphragmatic hernia without obstruction or gangrene: Secondary | ICD-10-CM

## 2022-01-23 MED ORDER — MELOXICAM 7.5 MG PO TABS: 7.5000 mg | ORAL_TABLET | Freq: Every day | ORAL | 0 refills | Status: DC

## 2022-01-23 NOTE — Progress Notes (Signed)
Subjective:  Patient ID: Terri Wiley, female    DOB: [birth year]  Age: 46 y.o. MRN: [id number]  CC:  Chief Complaint  Patient presents with   New Patient (Initial Visit)    Patient states she is here to establish care. Patient is having some pain in her knees and feet    HPI Terri Wiley presents for   New patient to establish care, previous primary care provider Dr. Karie Kirks in [place]. Wanted a change as concern about workup of symptoms.    Hx of hiatal hernia. Takes protonix QD - helps.  Out of protonix for 3 weeks.  Sharp chest pain with movement - about same time as out of meds. No CP with exertion. No recnet changes. Still periodic.  Father with cerebral aneurysm, mother with liver cirrhosis. No early CAD in family known.  No fever/cough/dyspnea.  Cardiology: Dr. Johnsie Cancel, appt with Ermalinda Barrios 12/12.  Plan for sleep study. Discussed chest pain. Low risk myoview in 02/2021. Normal EF on echo.  Atypical pain. 6 month follow up. Prescribed protonix - has not picked up.  Belcing with eating. No new changes.  Some pain in upper back. No fall/injury.   Hx of pedal edema, prior PCP rx lasix as needed for swelling - 1-2 times per week. No hx of CHF. Last dose of lasix last last week.   Hx of scolisosis, various areas of pain  - left arm, back, left knee, mid back down to left leg. Sharp pain across chest at times as above.  Discussed with prior PCP. Sx's for years without recent changes and no fall/injury.  Ibuprofen 856m used prior.  No hx of PUD.   89yoson passed away in S10-04-2023 Crohn's and enlarged heart. Still determining cause of death.  She is taking care of his 48yo dtr now.  [facility] need to meet with [facility] at this time. Sometimes feels depressed since son passed.  No SI.  [facility] 06/19/2021  Decreased Interest 0 1  Down, Depressed, Hopeless 1 0  PHQ - 2 Score 1 1  Altered sleeping 2 3  Tired,  decreased energy 1 3  Change in appetite 0 2  Feeling bad or failure about yourself  0 0  Trouble concentrating 0 0  Moving slowly or fidgety/restless 0 0  Suicidal thoughts 0 0  PHQ-9 Score 4 9  Difficult doing work/chores Somewhat difficult -     Screening options with colonoscopy versus Cologuard discussed. Concerned about colonoscopy as son passed few days after his.Discussed timing of repeat testing intervals if normal, as well as potential need for diagnostic Colonoscopy if positive Cologuard. Understanding expressed, and chose Cologuard.   History Patient Active Problem List   Diagnosis Date Noted   LLQ pain 07/11/2021   Hot flashes 07/11/2021   Papanicolaou smear of cervix with positive high risk human papilloma virus (HPV) test 06/24/2021   Trichimoniasis 06/24/2021   Menorrhagia with irregular cycle 06/19/2021   Fibroids 06/19/2021   Perimenopause 06/19/2021   Routine Papanicolaou smear 06/19/2021   Chronic left hip pain 02/28/2021   Mass of left hip region 02/28/2021   Past Medical History:  Diagnosis Date   GERD (gastroesophageal reflux disease)    Hernia cerebri ([place]    Papanicolaou smear of cervix with positive high risk human papilloma virus (HPV) test 06/24/2021   06/24/21 repeat pap in 1 year per [facility] guidelines    Trichimoniasis 06/24/2021   Treated 06/24/21, POC___________  Past Surgical History:  Procedure Laterality Date   HERNIA REPAIR     tubiligation     Allergies  Allergen Reactions   Bee Venom Shortness Of Breath   Penicillins Hives   Prior to Admission medications   Medication Sig Start Date End Date Taking? Authorizing Provider  acetaminophen (TYLENOL) 325 MG tablet Take 650 mg by mouth every 6 (six) hours as needed. pain    [provider]  aspirin 81 MG EC tablet Take 81 mg by mouth daily. Swallow whole.    [provider]  cefdinir (OMNICEF) 300 MG capsule Take 300 mg by mouth 2 (two) times daily. Patient not taking:  Reported on 11/14/2021 12/18/20   [provider]  cetirizine (ZYRTEC) 10 MG tablet Take 10 mg by mouth daily. 01/31/21   [provider]  doxycycline (VIBRAMYCIN) 100 MG capsule Take 1 capsule (100 mg total) by mouth 2 (two) times daily. Patient not taking: Reported on 12/09/2021 11/14/21   Evalee Jefferson, PA-C  FEROSUL 325 (65 Fe) MG tablet Take 325 mg by mouth daily. 12/02/20   [provider]  furosemide (LASIX) 40 MG tablet Take 40 mg by mouth daily as needed for fluid or edema. 04/30/21   [provider]  ibuprofen (ADVIL) 800 MG tablet Take 800 mg by mouth every 6 (six) hours as needed. For pain    [provider]  ipratropium (ATROVENT) 0.06 % nasal spray Place 2 sprays into both nostrils 3 (three) times daily. 09/10/21   [provider]  loratadine-pseudoephedrine (CLARITIN-D 12 HOUR) 5-120 MG tablet Take 1 tablet by mouth 2 (two) times daily. Patient not taking: Reported on 12/09/2021 11/14/21   Evalee Jefferson, PA-C  LORazepam (ATIVAN) 0.5 MG tablet Take 0.5 mg by mouth daily as needed for anxiety. 10/08/20   [provider]  methocarbamol (ROBAXIN) 500 MG tablet Take 1 tablet (500 mg total) by mouth 2 (two) times daily. Patient not taking: Reported on 11/14/2021 05/15/21   Garald Balding, PA-C  metroNIDAZOLE (FLAGYL) 500 MG tablet Take 1 tablet (500 mg total) by mouth 2 (two) times daily. Patient not taking: Reported on 12/09/2021 06/24/21   Estill Dooms, NP  Norethindrone-Ethinyl Estradiol-Fe Biphas (LO LOESTRIN FE) 1 MG-10 MCG / 10 MCG tablet Take 1 tablet by mouth daily. Take 1 daily by mouth 07/11/21   Derrek Monaco A, NP  pantoprazole (PROTONIX) 20 MG tablet Take 1 tablet (20 mg total) by mouth daily. 12/09/21   Imogene Burn, PA-C   Social History   Socioeconomic History   Marital status: Divorced    Spouse name: Not on file   Number of children: Not on file   Years of education: Not on file   Highest education  level: Not on file  Occupational History   Not on file  Tobacco Use   Smoking status: Never   Smokeless tobacco: Never  Vaping Use   Vaping Use: Never used  Substance and Sexual Activity   Alcohol use: No   Drug use: No   Sexual activity: Yes    Birth control/protection: Surgical    Comment: tubal  Other Topics Concern   Not on file  Social History Narrative   Not on file   Social Determinants of Health   Financial Resource Strain: Medium Risk   Difficulty of Paying Living Expenses: Somewhat hard  Food Insecurity: No Food Insecurity   Worried About Running Out of Food in the Last Year: Never true   Ran  Out of Food in the Last Year: Never true  Transportation Needs: No Transportation Needs   Lack of Transportation (Medical): No   Lack of Transportation (Non-Medical): No  Physical Activity: Insufficiently Active   Days of Exercise per Week: 2 days   Minutes of Exercise per Session: 30 min  Stress: Stress Concern Present   Feeling of Stress : To some extent  Social Connections: Moderately Isolated   Frequency of Communication with Friends and Family: Three times a week   Frequency of Social Gatherings with Friends and Family: Once a week   Attends Religious Services: 1 to 4 times per year   Active Member of [employer] or Organizations: No   Attends Archivist Meetings: Never   Marital Status: Divorced  Human resources officer Violence: Not At Risk   Fear of Current or Ex-Partner: No   Emotionally Abused: No   Physically Abused: No   Sexually Abused: No    Review of Systems   Objective:   Vitals:   01/23/22 1521  BP: (!) 144/76  Pulse: 67  Resp: 18  Temp: 98.2 F (36.8 C)  TempSrc: Temporal  SpO2: 99%  Weight: 270 lb (122.5 kg)  Height: 5' 9.5" (1.765 m)     Physical Exam Vitals reviewed. Exam conducted with a chaperone present.  Constitutional:      Appearance: Normal appearance. She is well-developed.  HENT:     Head: Normocephalic and atraumatic.   Eyes:     Conjunctiva/sclera: Conjunctivae normal.     Pupils: Pupils are equal, round, and reactive to light.  Neck:     Vascular: No carotid bruit.  Cardiovascular:     Rate and Rhythm: Normal rate and regular rhythm.     Heart sounds: Normal heart sounds.  Pulmonary:     Effort: Pulmonary effort is normal.     Breath sounds: Normal breath sounds.  Abdominal:     Palpations: Abdomen is soft. There is no pulsatile mass.     Tenderness: There is no abdominal tenderness.  Musculoskeletal:       Arms:     Right lower leg: No edema.     Left lower leg: No edema.  Skin:    General: Skin is warm and dry.  Neurological:     Mental Status: She is alert and oriented to person, place, and time.  Psychiatric:        Mood and Affect: Mood normal.        Behavior: Behavior normal.     47 minutes spent during visit, including chart review, discussion of various arthralgias and prior treatments, cardiology note review, counseling and assimilation of information, exam, discussion of plan, and chart completion.    Assessment & Plan:  Terri Wiley is a 46 y.o. female . Hiatal hernia Chest pain, unspecified type Gastroesophageal reflux disease, unspecified whether esophagitis present Chest wall pain - Plan: DG Chest 2 View, meloxicam (MOBIC) 7.5 MG tablet  -History of hiatal hernia, reflux, off PPI recently.  Has not picked up prescription from recent cardiology visit.  Advised that should be at her pharmacy.  Chest pain atypical and does have some MSK component, chest wall component likely.  Reassuring previous evaluation with cardiology and denies change in symptoms.  Chest x-ray was obtained but anticipate that we will be normal.  Lungs were clear.  Trial of meloxicam for chest wall pain, handout given, recheck next few weeks.  ER chest pain precautions given.  Screening for colon cancer - Plan: Cologuard  Polyarthralgia - Plan: DG Chest 2 View, meloxicam (MOBIC) 7.5 MG  tablet Longstanding symptoms.  Denies recent changes.  Denies recent injury.  We will treat with meloxicam as above, plan on follow-up next few weeks and at that time can look at specific areas of involvement.  Osteoarthritis versus less likely inflammatory arthritis versus fibromyalgia possible.  Denies persistent depression at this time. Appropriate recent grieving, adjustment with loss of son.  Denies need for resources at this time as participating with grief share.  Could consider SNRI if persistent arthralgias and possible fibromyalgia.  Recheck next few weeks.   Meds ordered this encounter  Medications   meloxicam (MOBIC) 7.5 MG tablet    Sig: Take 1 tablet (7.5 mg total) by mouth daily.    Dispense:  30 tablet    Refill:  0   Patient Instructions  Restart protonix  - that should be at your pharmacy as it was prescribed by cardiology  Tylenol if needed for chest pains, start mobic once per day (may help other pains as well -do not combine with other NSAIDs like ibuprofen.  Only Tylenol over-the-counter is okay) Please have x-ray performed at the [facility] location.  If any concerns on the x-ray I will let you know.  Recheck in next 2-3 weeks.   Cologuard ordered for colon cancer screening  I am so sorry about your son's passing.  Please let me know if other resources needed at this difficult time.  I would like to discuss the other areas of pain at your next visit as those have been present for some time.    We can also plan on fasting labs at next visit.  Return to the clinic or go to the nearest emergency room if any of your symptoms worsen or new symptoms occur.  Nonspecific Chest Pain, Adult Chest pain is an uncomfortable, tight, or painful feeling in the chest. The pain can feel like a crushing, aching, or squeezing pressure. A person can feel a burning or tingling sensation. Chest pain can also be felt in your back, neck, jaw, shoulder, or arm. This pain can be worse when you  move, sneeze, or take a deep breath. Chest pain can be caused by a condition that is life-threatening. This must be treated right away. It can also be caused by something that is not life-threatening. If you have chest pain, it can be hard to know the difference, so it is important to get help right away to make sure that you do not have a serious condition. Some life-threatening causes of chest pain include: Heart attack. A tear in the body's main blood vessel (aortic dissection). Inflammation around your heart (pericarditis). A problem in the lungs, such as a blood clot (pulmonary embolism) or a collapsed lung (pneumothorax). Some non life-threatening causes of chest pain include: Heartburn. Anxiety or stress. Damage to the bones, muscles, and cartilage that make up your chest wall. Pneumonia or bronchitis. Shingles infection (varicella-zoster virus). Your chest pain may come and go. It may also be constant. Your health care provider will do tests and other studies to find the cause of your pain. Treatment will depend on the cause of your chest pain. Follow these instructions at home: Medicines Take over-the-counter and prescription medicines only as told by your health care provider. If you were prescribed an antibiotic medicine, take it as told by your health care provider. Do not stop taking the antibiotic even if you start to feel better. Activity Avoid any  activities that cause chest pain. Do not lift anything that is heavier than 10 lb (4.5 kg), or the limit that you are told, until your health care provider says that it is safe. Rest as directed by your health care provider. Return to your normal activities only as told by your health care provider. Ask your health care provider what activities are safe for you. Lifestyle   Do not use any products that contain nicotine or tobacco, such as cigarettes, e-cigarettes, and chewing tobacco. If you need help quitting, ask your health care  provider. Do not drink alcohol. Make healthy lifestyle changes as recommended. These may include: Getting regular exercise. Ask your health care provider to suggest some exercises that are safe for you. Eating a heart-healthy diet. This includes plenty of fresh fruits and vegetables, whole grains, low-fat (lean) protein, and low-fat dairy products. A dietitian can help you find healthy eating options. Maintaining a healthy weight. Managing any other health conditions you may have, such as high blood pressure (hypertension) or diabetes. Reducing stress, such as with yoga or relaxation techniques. General instructions Pay attention to any changes in your symptoms. It is up to you to get the results of any tests that were done. Ask your health care provider, or the department that is doing the tests, when your results will be ready. Keep all follow-up visits as told by your health care provider. This is important. You may be asked to go for further testing if your chest pain does not go away. Contact a health care provider if: Your chest pain does not go away. You feel depressed. You have a fever. You notice changes in your symptoms or develop new symptoms. Get help right away if: Your chest pain gets worse. You have a cough that gets worse, or you cough up blood. You have severe pain in your abdomen. You faint. You have sudden, unexplained chest discomfort. You have sudden, unexplained discomfort in your arms, back, neck, or jaw. You have shortness of breath at any time. You suddenly start to sweat, or your skin gets clammy. You feel nausea or you vomit. You suddenly feel lightheaded or dizzy. You have severe weakness, or unexplained weakness or fatigue. Your heart begins to beat quickly, or it feels like it is skipping beats. These symptoms may represent a serious problem that is an emergency. Do not wait to see if the symptoms will go away. Get medical help right away. Call your local  emergency services (911 in the [place] Do not drive yourself to the hospital. Summary Chest pain can be caused by a condition that is serious and requires urgent treatment. It may also be caused by something that is not life-threatening. Your health care provider may do lab tests and other studies to find the cause of your pain. Follow your health care provider's instructions on taking medicines, making lifestyle changes, and getting emergency treatment if symptoms become worse. Keep all follow-up visits as told by your health care provider. This includes visits for any further testing if your chest pain does not go away. This information is not intended to replace advice given to you by your health care provider. Make sure you discuss any questions you have with your health care provider. Document Revised: 02/28/2021 Document Reviewed: 02/28/2021 Elsevier Patient Education  [place].   Chest Wall Pain Chest wall pain is pain in or around the bones and muscles of your chest. Sometimes, an injury causes this pain. Excessive coughing or overuse  of arm and chest muscles may also cause chest wall pain. Sometimes, the cause may not be known. This pain may take several weeks or longer to get better. Follow these instructions at home: Managing pain, stiffness, and swelling  If directed, put ice on the painful area: Put ice in a plastic bag. Place a towel between your skin and the bag. Leave the ice on for 20 minutes, 2-3 times per day. Activity Rest as told by your health care provider. Avoid activities that cause pain. These include any activities that use your chest muscles or your abdominal and side muscles to lift heavy items. Ask your health care provider what activities are safe for you. General instructions  Take over-the-counter and prescription medicines only as told by your health care provider. Do not use any products that contain nicotine or tobacco, such as cigarettes,  e-cigarettes, and chewing tobacco. These can delay healing after injury. If you need help quitting, ask your health care provider. Keep all follow-up visits as told by your health care provider. This is important. Contact a health care provider if: You have a fever. Your chest pain becomes worse. You have new symptoms. Get help right away if: You have nausea or vomiting. You feel sweaty or light-headed. You have a cough with mucus from your lungs (sputum) or you cough up blood. You develop shortness of breath. These symptoms may represent a serious problem that is an emergency. Do not wait to see if the symptoms will go away. Get medical help right away. Call your local emergency services (911 in the [place] Do not drive yourself to the hospital. Summary Chest wall pain is pain in or around the bones and muscles of your chest. Depending on the cause, it may be treated with ice, rest, medicines, and avoiding activities that cause pain. Contact a health care provider if you have a fever, worsening chest pain, or new symptoms. Get help right away if you feel light-headed or you develop shortness of breath. These symptoms may be an emergency. This information is not intended to replace advice given to you by your health care provider. Make sure you discuss any questions you have with your health care provider. Document Revised: 03/01/2021 Document Reviewed: 03/01/2021 Elsevier Patient Education  2022 [place],   Merri Ray, MD [place], [facility] 01/23/22 5:37 PM

## 2022-01-23 NOTE — Telephone Encounter (Signed)
Prior Authorization for NPSG sent to [facility] via web portal. Tracking Number  do not require Pre-Authorization by [facility].

## 2022-01-23 NOTE — Patient Instructions (Addendum)
Restart protonix  - that should be at your pharmacy as it was prescribed by cardiology  Tylenol if needed for chest pains, start mobic once per day (may help other pains as well -do not combine with other NSAIDs like ibuprofen.  Only Tylenol over-the-counter is okay) Please have x-ray performed at the [facility] location.  If any concerns on the x-ray I will let you know.  Recheck in next 2-3 weeks.   Cologuard ordered for colon cancer screening  I am so sorry about your son's passing.  Please let me know if other resources needed at this difficult time.  I would like to discuss the other areas of pain at your next visit as those have been present for some time.    We can also plan on fasting labs at next visit.  Return to the clinic or go to the nearest emergency room if any of your symptoms worsen or new symptoms occur.  Nonspecific Chest Pain, Adult Chest pain is an uncomfortable, tight, or painful feeling in the chest. The pain can feel like a crushing, aching, or squeezing pressure. A person can feel a burning or tingling sensation. Chest pain can also be felt in your back, neck, jaw, shoulder, or arm. This pain can be worse when you move, sneeze, or take a deep breath. Chest pain can be caused by a condition that is life-threatening. This must be treated right away. It can also be caused by something that is not life-threatening. If you have chest pain, it can be hard to know the difference, so it is important to get help right away to make sure that you do not have a serious condition. Some life-threatening causes of chest pain include: Heart attack. A tear in the body's main blood vessel (aortic dissection). Inflammation around your heart (pericarditis). A problem in the lungs, such as a blood clot (pulmonary embolism) or a collapsed lung (pneumothorax). Some non life-threatening causes of chest pain include: Heartburn. Anxiety or stress. Damage to the bones, muscles, and cartilage  that make up your chest wall. Pneumonia or bronchitis. Shingles infection (varicella-zoster virus). Your chest pain may come and go. It may also be constant. Your health care provider will do tests and other studies to find the cause of your pain. Treatment will depend on the cause of your chest pain. Follow these instructions at home: Medicines Take over-the-counter and prescription medicines only as told by your health care provider. If you were prescribed an antibiotic medicine, take it as told by your health care provider. Do not stop taking the antibiotic even if you start to feel better. Activity Avoid any activities that cause chest pain. Do not lift anything that is heavier than 10 lb (4.5 kg), or the limit that you are told, until your health care provider says that it is safe. Rest as directed by your health care provider. Return to your normal activities only as told by your health care provider. Ask your health care provider what activities are safe for you. Lifestyle   Do not use any products that contain nicotine or tobacco, such as cigarettes, e-cigarettes, and chewing tobacco. If you need help quitting, ask your health care provider. Do not drink alcohol. Make healthy lifestyle changes as recommended. These may include: Getting regular exercise. Ask your health care provider to suggest some exercises that are safe for you. Eating a heart-healthy diet. This includes plenty of fresh fruits and vegetables, whole grains, low-fat (lean) protein, and low-fat dairy products.  A dietitian can help you find healthy eating options. Maintaining a healthy weight. Managing any other health conditions you may have, such as high blood pressure (hypertension) or diabetes. Reducing stress, such as with yoga or relaxation techniques. General instructions Pay attention to any changes in your symptoms. It is up to you to get the results of any tests that were done. Ask your health care provider,  or the department that is doing the tests, when your results will be ready. Keep all follow-up visits as told by your health care provider. This is important. You may be asked to go for further testing if your chest pain does not go away. Contact a health care provider if: Your chest pain does not go away. You feel depressed. You have a fever. You notice changes in your symptoms or develop new symptoms. Get help right away if: Your chest pain gets worse. You have a cough that gets worse, or you cough up blood. You have severe pain in your abdomen. You faint. You have sudden, unexplained chest discomfort. You have sudden, unexplained discomfort in your arms, back, neck, or jaw. You have shortness of breath at any time. You suddenly start to sweat, or your skin gets clammy. You feel nausea or you vomit. You suddenly feel lightheaded or dizzy. You have severe weakness, or unexplained weakness or fatigue. Your heart begins to beat quickly, or it feels like it is skipping beats. These symptoms may represent a serious problem that is an emergency. Do not wait to see if the symptoms will go away. Get medical help right away. Call your local emergency services (911 in the [place] Do not drive yourself to the hospital. Summary Chest pain can be caused by a condition that is serious and requires urgent treatment. It may also be caused by something that is not life-threatening. Your health care provider may do lab tests and other studies to find the cause of your pain. Follow your health care provider's instructions on taking medicines, making lifestyle changes, and getting emergency treatment if symptoms become worse. Keep all follow-up visits as told by your health care provider. This includes visits for any further testing if your chest pain does not go away. This information is not intended to replace advice given to you by your health care provider. Make sure you discuss any questions you have  with your health care provider. Document Revised: 02/28/2021 Document Reviewed: 02/28/2021 Elsevier Patient Education  [place].   Chest Wall Pain Chest wall pain is pain in or around the bones and muscles of your chest. Sometimes, an injury causes this pain. Excessive coughing or overuse of arm and chest muscles may also cause chest wall pain. Sometimes, the cause may not be known. This pain may take several weeks or longer to get better. Follow these instructions at home: Managing pain, stiffness, and swelling  If directed, put ice on the painful area: Put ice in a plastic bag. Place a towel between your skin and the bag. Leave the ice on for 20 minutes, 2-3 times per day. Activity Rest as told by your health care provider. Avoid activities that cause pain. These include any activities that use your chest muscles or your abdominal and side muscles to lift heavy items. Ask your health care provider what activities are safe for you. General instructions  Take over-the-counter and prescription medicines only as told by your health care provider. Do not use any products that contain nicotine or tobacco, such as cigarettes,  e-cigarettes, and chewing tobacco. These can delay healing after injury. If you need help quitting, ask your health care provider. Keep all follow-up visits as told by your health care provider. This is important. Contact a health care provider if: You have a fever. Your chest pain becomes worse. You have new symptoms. Get help right away if: You have nausea or vomiting. You feel sweaty or light-headed. You have a cough with mucus from your lungs (sputum) or you cough up blood. You develop shortness of breath. These symptoms may represent a serious problem that is an emergency. Do not wait to see if the symptoms will go away. Get medical help right away. Call your local emergency services (911 in the [place] Do not drive yourself to the  hospital. Summary Chest wall pain is pain in or around the bones and muscles of your chest. Depending on the cause, it may be treated with ice, rest, medicines, and avoiding activities that cause pain. Contact a health care provider if you have a fever, worsening chest pain, or new symptoms. Get help right away if you feel light-headed or you develop shortness of breath. These symptoms may be an emergency. This information is not intended to replace advice given to you by your health care provider. Make sure you discuss any questions you have with your health care provider. Document Revised: 03/01/2021 Document Reviewed: 03/01/2021 Elsevier Patient Education  [place].

## 2022-02-13 ENCOUNTER — Telehealth: Payer: Self-pay | Admitting: *Deleted

## 2022-02-13 NOTE — Telephone Encounter (Signed)
Spoke with patient and gave her the date, time and location for her sleep study. She was agreeable and appreciative for the call.

## 2022-02-14 LAB — COLOGUARD: COLOGUARD: NEGATIVE

## 2022-02-27 ENCOUNTER — Encounter: Payer: Self-pay | Admitting: Family Medicine

## 2022-02-27 ENCOUNTER — Ambulatory Visit (INDEPENDENT_AMBULATORY_CARE_PROVIDER_SITE_OTHER): Payer: 59 | Admitting: Family Medicine

## 2022-02-27 VITALS — BP 128/90 | HR 61 | Temp 97.7°F | Resp 16 | Ht 69.5 in | Wt 268.2 lb

## 2022-02-27 DIAGNOSIS — M26629 Arthralgia of temporomandibular joint, unspecified side: Secondary | ICD-10-CM

## 2022-02-27 DIAGNOSIS — R739 Hyperglycemia, unspecified: Secondary | ICD-10-CM | POA: Diagnosis not present

## 2022-02-27 DIAGNOSIS — Z13220 Encounter for screening for lipoid disorders: Secondary | ICD-10-CM | POA: Diagnosis not present

## 2022-02-27 DIAGNOSIS — M419 Scoliosis, unspecified: Secondary | ICD-10-CM

## 2022-02-27 DIAGNOSIS — M5442 Lumbago with sciatica, left side: Secondary | ICD-10-CM

## 2022-02-27 DIAGNOSIS — H9202 Otalgia, left ear: Secondary | ICD-10-CM

## 2022-02-27 DIAGNOSIS — G8929 Other chronic pain: Secondary | ICD-10-CM

## 2022-02-27 DIAGNOSIS — Z1159 Encounter for screening for other viral diseases: Secondary | ICD-10-CM

## 2022-02-27 DIAGNOSIS — Z114 Encounter for screening for human immunodeficiency virus [HIV]: Secondary | ICD-10-CM

## 2022-02-27 DIAGNOSIS — D649 Anemia, unspecified: Secondary | ICD-10-CM

## 2022-02-27 DIAGNOSIS — R6884 Jaw pain: Secondary | ICD-10-CM

## 2022-02-27 LAB — COMPREHENSIVE METABOLIC PANEL
ALT: 9 U/L (ref 0–35)
AST: 11 U/L (ref 0–37)
Albumin: 3.7 g/dL (ref 3.5–5.2)
Alkaline Phosphatase: 87 U/L (ref 39–117)
BUN: 11 mg/dL (ref 6–23)
CO2: 30 mEq/L (ref 19–32)
Calcium: 8.9 mg/dL (ref 8.4–10.5)
Chloride: 106 mEq/L (ref 96–112)
Creatinine, Ser: 0.68 mg/dL (ref 0.40–1.20)
GFR: 103.5 mL/min (ref >60.00)
Glucose, Bld: 84 mg/dL (ref 70–99)
Potassium: 3.8 mEq/L (ref 3.5–5.1)
Sodium: 141 mEq/L (ref 135–145)
Total Bilirubin: 0.6 mg/dL (ref 0.2–1.2)
Total Protein: 7.3 g/dL (ref 6.0–8.3)

## 2022-02-27 LAB — CBC
HCT: 35.7 % — ABNORMAL LOW (ref 36.0–46.0)
Hemoglobin: 11.9 g/dL — ABNORMAL LOW (ref 12.0–15.0)
MCHC: 33.4 g/dL (ref 30.0–36.0)
MCV: 88 fl (ref 78.0–100.0)
Platelets: 251 10*3/uL (ref 150.0–400.0)
RBC: 4.05 Mil/uL (ref 3.87–5.11)
RDW: 14.3 % (ref 11.5–15.5)
WBC: 4.2 10*3/uL (ref 4.0–10.5)

## 2022-02-27 LAB — LIPID PANEL
Cholesterol: 151 mg/dL (ref 0–200)
HDL: 48.1 mg/dL (ref >39.00)
LDL Cholesterol: 94 mg/dL (ref 0–99)
NonHDL: 102.94
Total CHOL/HDL Ratio: 3
Triglycerides: 46 mg/dL (ref 0.0–149.0)
VLDL: 9.2 mg/dL (ref 0.0–40.0)

## 2022-02-27 LAB — HEMOGLOBIN A1C: Hgb A1c MFr Bld: 5.4 % (ref 4.6–6.5)

## 2022-02-27 NOTE — Patient Instructions (Addendum)
I am glad to hear that the chest symptoms have improved. I will refer you to a back specialist to decide on physical therapy or other treatments, they can also decide on MRI or other imaging if needed.  Okay to continue meloxicam for now.  See information below on back pain.  I will check some fasting labs today and we can discuss those results further at your next visit.  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  See information below.  Meloxicam should help.  If any new or worsening symptoms return for recheck here, urgent care or emergency room if needed.  Thank you for coming in today.  Chronic Back Pain When back pain lasts longer than 3 months, it is called chronic back pain. The cause of your back pain may not be known. Some common causes include: Wear and tear (degenerative disease) of the bones, ligaments, or disks in your back. Inflammation and stiffness in your back (arthritis). People who have chronic back pain often go through certain periods in which the pain is more intense (flare-ups). Many people can learn to manage the pain with home care. Follow these instructions at home: Pay attention to any changes in your symptoms. Take these actions to help with your pain: Managing pain and stiffness   If directed, apply ice to the painful area. Your health care provider may recommend applying ice during the first 24-48 hours after a flare-up begins. To do this: Put ice in a plastic bag. Place a towel between your skin and the bag. Leave the ice on for 20 minutes, 2-3 times per day. If directed, apply heat to the affected area as often as told by your health care provider. Use the heat source that your health care provider recommends, such as a moist heat pack or a heating pad. Place a towel between your skin and the heat source. Leave the heat on for 20-30 minutes. Remove the heat if your skin turns bright red. This is especially important if you are  unable to feel pain, heat, or cold. You may have a greater risk of getting burned. Try soaking in a warm tub. Activity  Avoid bending and other activities that make the problem worse. Maintain a proper position when standing or sitting: When standing, keep your upper back and neck straight, with your shoulders pulled back. Avoid slouching. When sitting, keep your back straight and relax your shoulders. Do not round your shoulders or pull them backward. Do not sit or stand in one place for long periods of time. Take brief periods of rest throughout the day. This will reduce your pain. Resting in a lying or standing position is usually better than sitting to rest. When you are resting for longer periods, mix in some mild activity or stretching between periods of rest. This will help to prevent stiffness and pain. Get regular exercise. Ask your health care provider what activities are safe for you. Do not lift anything that is heavier than 10 lb (4.5 kg), or the limit that you are told, until your health care provider says that it is safe. Always use proper lifting technique, which includes: Bending your knees. Keeping the load close to your body. Avoiding twisting. Sleep on a firm mattress in a comfortable position. Try lying on your side with your knees slightly bent. If you lie on your back, put a pillow under your knees. Medicines Treatment may include medicines for pain and inflammation taken  by mouth or applied to the skin, prescription pain medicine, or muscle relaxants. Take over-the-counter and prescription medicines only as told by your health care provider. Ask your health care provider if the medicine prescribed to you: Requires you to avoid driving or using machinery. Can cause constipation. You may need to take these actions to prevent or treat constipation: Drink enough fluid to keep your urine pale yellow. Take over-the-counter or prescription medicines. Eat foods that are high  in fiber, such as beans, whole grains, and fresh fruits and vegetables. Limit foods that are high in fat and processed sugars, such as fried or sweet foods. General instructions Do not use any products that contain nicotine or tobacco, such as cigarettes, e-cigarettes, and chewing tobacco. If you need help quitting, ask your health care provider. Keep all follow-up visits as told by your health care provider. This is important. Contact a health care provider if: You have pain that is not relieved with rest or medicine. Your pain gets worse, or you have new pain. You have a high fever. You have rapid weight loss. You have trouble doing your normal activities. Get help right away if: You have weakness or numbness in one or both of your legs or feet. You have trouble controlling your bladder or your bowels. You have severe back pain and have any of the following: Nausea or vomiting. Pain in your abdomen. Shortness of breath or you faint. Summary Chronic back pain is back pain that lasts longer than 3 months. When a flare-up begins, apply ice to the painful area for the first 24-48 hours. Apply a moist heat pad or use a heating pad on the painful area as directed by your health care provider. When you are resting for longer periods, mix in some mild activity or stretching between periods of rest. This will help to prevent stiffness and pain. This information is not intended to replace advice given to you by your health care provider. Make sure you discuss any questions you have with your health care provider. Document Revised: 01/25/2020 Document Reviewed: 01/25/2020 Elsevier Patient Education  2022 [place].   Temporomandibular Joint Syndrome Temporomandibular joint syndrome (TMJ syndrome) is a condition that causes pain in the temporomandibular joints. These joints are located near your ears and allow your jaw to open and close. For people with TMJ syndrome, chewing, biting, or other  movements of the jaw can be difficult or painful. TMJ syndrome is often mild and goes away within a few weeks. However, sometimes the condition becomes a long-term (chronic) problem. What are the causes? This condition may be caused by: Grinding your teeth or clenching your jaw. Some people do this when they are stressed. Arthritis. An injury to the jaw. A head or neck injury. Teeth or dentures that are not aligned well. In some cases, the cause of TMJ syndrome may not be known. What are the signs or symptoms? The most common symptom of this condition is aching pain on the side of the head in the area of the TMJ. Other symptoms may include: Pain when moving your jaw, such as when chewing or biting. Not being able to open your jaw all the way. Making a clicking sound when you open your mouth. Headache. Earache. Neck or shoulder pain. How is this diagnosed? This condition may be diagnosed based on: Your symptoms and medical history. A physical exam. Your health care provider may check the range of motion of your jaw. Imaging tests, such as X-rays or  an MRI. You may also need to see your dentist, who will check if your teeth and jaw are lined up correctly. How is this treated? TMJ syndrome often goes away on its own. If treatment is needed, it may include: Eating soft foods and applying ice or heat. Medicines to relieve pain or inflammation. Medicines or massage to relax the muscles. A splint, bite plate, or mouthpiece to prevent teeth grinding or jaw clenching. Relaxation techniques or counseling to help reduce stress. A therapy for pain in which an electrical current is applied to the nerves through the skin (transcutaneous electrical nerve stimulation). Acupuncture. This may help to relieve pain. Jaw surgery. This is rarely needed. Follow these instructions at home: Eating and drinking Eat a soft diet if you are having trouble chewing. Avoid foods that require a lot of chewing.  Do not chew gum. General instructions Take over-the-counter and prescription medicines only as told by your health care provider. If directed, put ice on the painful area. To do this: Put ice in a plastic bag. Place a towel between your skin and the bag. Leave the ice on for 20 minutes, 2-3 times a day. Remove the ice if your skin turns bright red. This is very important. If you cannot feel pain, heat, or cold, you have a greater risk of damage to the area. Apply a warm, wet cloth (warm compress) to the painful area as told. Massage your jaw area and do any jaw stretching exercises as told by your health care provider. If you were given a splint, bite plate, or mouthpiece, wear it as told by your health care provider. Keep all follow-up visits. This is important. Where to find more information [place]: [email or web address]/ Contact a health care provider if: You have trouble eating. You have new or worsening symptoms. Get help right away if: Your jaw locks. Summary Temporomandibular joint syndrome (TMJ syndrome) is a condition that causes pain in the temporomandibular joints. These joints are located near your ears and allow your jaw to open and close. TMJ syndrome is often mild and goes away within a few weeks. However, sometimes the condition becomes a long-term (chronic) problem. Symptoms include an aching pain on the side of the head in the area of the TMJ, pain when chewing or biting, and being unable to open your jaw all the way. You may also make a clicking sound when you open your mouth. TMJ syndrome often goes away on its own. If treatment is needed, it may include medicines to relieve pain, reduce inflammation, or relax the muscles. A splint, bite plate, or mouthpiece may also be used to prevent teeth grinding or jaw clenching. This information is not intended to replace advice given to you by your health care provider. Make sure you  discuss any questions you have with your health care provider. Document Revised: 07/28/2021 Document Reviewed: 07/28/2021 Elsevier Patient Education  [place].

## 2022-02-27 NOTE — Progress Notes (Signed)
Subjective:  Patient ID: Terri Wiley, female    DOB: [birth year]  Age: 46 y.o. MRN: [id number]  CC:  Chief Complaint  Patient presents with   Joint Pain    has been using meloxicam and has been helping, currently has mid back pain for about a week patient is fasting today. PHQ9 score 6  Patient will need a work note for today's visit, she is going  to work after  appointment     HPI Terri Wiley presents for   Polyarthralgia, chest pain Follow-up from January 26 visit. Started on meloxicam at that time, atypical chest pain, possible musculoskeletal component at that time.  Had reassuring previous evaluation with cardiology and denies change in symptoms at last visit.  Chest x-ray was ordered but not performed- did not have order, no call received.  Plan for restart of Protonix as prescribed by cardiology. Also had multiple areas of arthralgias -arm, back, left knee, mid back down the left leg that apparently had been present for some time.  Started on meloxicam as above, symptoms have improved.  Some mid back pain.  We did discuss possibility of SNRI if persistent arthralgias, possible fibromyalgia.  Chest pains better, breathing better. No new cough. Taking protonix.  Still left sided mid back pain - minor pain, but same. No injury/fall. Hx of bulging disc, scoliosis per last PCP. Pain in back past year.  No PT. Told to lose weight. No bowel or bladder incontinence, no saddle anesthesia, radiating pain down left leg past year with the back pain. Pain but no weakness. Occasionally uses crutch. Meloxicam is helping.   LS spine XR 02/15/21: FINDINGS: There are five non-rib bearing lumbar-type vertebral bodies. No spondylolisthesis. Partial visualization of dextrocurvature centered at the thoracolumbar junction. There is no evidence for acute fracture or subluxation. Mild multilevel endplate proliferative changes with relative preservation of the disc spaces. Mild lower lumbar facet  arthropathy. Visualized abdomen is unremarkable.   IMPRESSION: 1. No acute osseous abnormality in the lumbar spine.  Hyperglycemia Noted on labs in June 2022. Fasting today.  Repeat lab work planned today. No results found for: HGBA1C  Anemia Last hemoglobin in June 2022 at 11.0, improved from 10.6, 10.0 previously.  Treated by GYN, over-the-counter iron supplement recommended. On iron once every other day due to constipation with daily dosing.  L jaw soreness Last week. Sore in left ear, no change in hearing. Ear to left jaw area.  Able to chew ok. Has popping/clicking in that area.  No fever.  Hearing ok. No ear discharge.  No treatments. Some stress, some grinding of teeth.   Positive depression screening Discussed appropriate grieving at her last visit after loss of son.  Denies need for resources at that time, was participating with grief share. Still doing ok, no SI/HI or acute needs. Declines counseling.   History Patient Active Problem List   Diagnosis Date Noted   LLQ pain 07/11/2021   Hot flashes 07/11/2021   Papanicolaou smear of cervix with positive high risk human papilloma virus (HPV) test 06/24/2021   Trichimoniasis 06/24/2021   Menorrhagia with irregular cycle 06/19/2021   Fibroids 06/19/2021   Perimenopause 06/19/2021   Routine Papanicolaou smear 06/19/2021   Chronic left hip pain 02/28/2021   Mass of left hip region 02/28/2021   Past Medical History:  Diagnosis Date   GERD (gastroesophageal reflux disease)    Hernia cerebri ([place])    Papanicolaou smear of cervix with positive high risk human papilloma virus (HPV) test  06/24/2021   06/24/21 repeat pap in 1 year per [facility]P guidelines    Trichimoniasis 06/24/2021   Treated 06/24/21, POC___________   Past Surgical History:  Procedure Laterality Date   HERNIA REPAIR     tubiligation     Allergies  Allergen Reactions   Bee Venom Shortness Of Breath   Penicillins Hives   Prior to Admission medications    Medication Sig Start Date End Date Taking? Authorizing Provider  acetaminophen (TYLENOL) 325 MG tablet Take 650 mg by mouth every 6 (six) hours as needed. pain   Yes [provider]  aspirin 81 MG EC tablet Take 81 mg by mouth daily. Swallow whole.   Yes [provider]  cetirizine (ZYRTEC) 10 MG tablet Take 10 mg by mouth daily. 01/31/21  Yes [provider]  FEROSUL 325 (65 Fe) MG tablet Take 325 mg by mouth daily. 12/02/20  Yes [provider]  furosemide (LASIX) 40 MG tablet Take 40 mg by mouth daily as needed for fluid or edema. 04/30/21  Yes [provider]  ibuprofen (ADVIL) 800 MG tablet Take 800 mg by mouth every 6 (six) hours as needed. For pain   Yes [provider]  ipratropium (ATROVENT) 0.06 % nasal spray Place 2 sprays into both nostrils 3 (three) times daily. 09/10/21  Yes [provider]  LORazepam (ATIVAN) 0.5 MG tablet Take 0.5 mg by mouth daily as needed for anxiety. 10/08/20  Yes [provider]  meloxicam (MOBIC) 7.5 MG tablet Take 1 tablet (7.5 mg total) by mouth daily. 01/23/22  Yes Wendie Agreste, MD  Norethindrone-Ethinyl Estradiol-Fe Biphas (LO LOESTRIN FE) 1 MG-10 MCG / 10 MCG tablet Take 1 tablet by mouth daily. Take 1 daily by mouth 07/11/21  Yes Derrek Monaco A, NP  pantoprazole (PROTONIX) 20 MG tablet Take 1 tablet (20 mg total) by mouth daily. 12/09/21  Yes Imogene Burn, PA-C   Social History   Socioeconomic History   Marital status: Divorced    Spouse name: Not on file   Number of children: Not on file   Years of education: Not on file   Highest education level: Not on file  Occupational History   Not on file  Tobacco Use   Smoking status: Never   Smokeless tobacco: Never  Vaping Use   Vaping Use: Never used  Substance and Sexual Activity   Alcohol use: No   Drug use: No   Sexual activity: Yes    Birth control/protection: Surgical    Comment: tubal  Other Topics Concern    Not on file  Social History Narrative   Not on file   Social Determinants of Health   Financial Resource Strain: Medium Risk   Difficulty of Paying Living Expenses: Somewhat hard  Food Insecurity: No Food Insecurity   Worried About Running Out of Food in the Last Year: Never true   Ran Out of Food in the Last Year: Never true  Transportation Needs: No Transportation Needs   Lack of Transportation (Medical): No   Lack of Transportation (Non-Medical): No  Physical Activity: Insufficiently Active   Days of Exercise per Week: 2 days   Minutes of Exercise per Session: 30 min  Stress: Stress Concern Present   Feeling of Stress : To some extent  Social Connections: Moderately Isolated   Frequency of Communication with Friends and Family: Three times a week   Frequency of Social Gatherings with Friends and Family: Once a week   Attends Religious Services:  1 to 4 times per year   Active Member of Clubs or Organizations: No   Attends Archivist Meetings: Never   Marital Status: Divorced  Human resources officer Violence: Not At Risk   Fear of Current or Ex-Partner: No   Emotionally Abused: No   Physically Abused: No   Sexually Abused: No    Review of Systems Per HPI  Objective:   Vitals:   02/27/22 0910  BP: 128/90  Pulse: 61  Resp: 16  Temp: 97.7 F (36.5 C)  TempSrc: Temporal  SpO2: 96%  Weight: 268 lb 3.2 oz (121.7 kg)  Height: 5' 9.5" (1.765 m)     Physical Exam Vitals reviewed.  Constitutional:      Appearance: Normal appearance. She is well-developed.  HENT:     Head: Normocephalic and atraumatic.     Right Ear: Tympanic membrane, ear canal and external ear normal.     Left Ear: Tympanic membrane, ear canal and external ear normal.     Ears:     Comments: Left TM appears pearly gray, no effusion, canal clear.  Minimal discomfort with traction of pinna but describes main area of discomfort at her temporomandibular joint, toward proximal jaw and just below  ear.  No lymphadenopathy, mass or swelling appreciated.  Skin intact without erythema.  Slight discomfort with opening and closing jaw at TMJ. Eyes:     Conjunctiva/sclera: Conjunctivae normal.     Pupils: Pupils are equal, round, and reactive to light.  Neck:     Vascular: No carotid bruit.  Cardiovascular:     Rate and Rhythm: Normal rate and regular rhythm.     Heart sounds: Normal heart sounds.  Pulmonary:     Effort: Pulmonary effort is normal.     Breath sounds: Normal breath sounds.  Abdominal:     Palpations: Abdomen is soft. There is no pulsatile mass.     Tenderness: There is no abdominal tenderness.  Musculoskeletal:     Right lower leg: No edema.     Left lower leg: No edema.     Comments: No midline bony tenderness of lumbar spine, describes area of discomfort at the lower thoracic, upper lumbar area paraspinals on the left.  No lower back discomfort on exam, or at [facility].  Negative seated straight leg raise, ambulating without difficulty or assistive device.   Skin:    General: Skin is warm and dry.  Neurological:     Mental Status: She is alert and oriented to person, place, and time.  Psychiatric:        Mood and Affect: Mood normal.        Behavior: Behavior normal.       Assessment & Plan:  Terri Wiley is a 46 y.o. female . Chronic left-sided low back pain with left-sided sciatica - Plan: Ambulatory referral to Spine Surgery Scoliosis of lumbar spine, unspecified scoliosis type - Plan: Ambulatory referral to Spine Surgery  -Chronic low back pain, scoliosis.  Potentially may benefit from physical therapy initially but will refer to back specialist to decide on next step or need for advanced imaging.  Continue Mobic.  Anemia, unspecified type - Plan: CBC  -Continue iron supplementation, check CBC to decide on dosage changes.  Hyperglycemia - Plan: Comprehensive metabolic panel, Hemoglobin A1c Updated labs ordered.  Encounter for hepatitis C screening test for  low risk patient - Plan: Hepatitis C Antibody  Screening for HIV (human immunodeficiency virus) - Plan: HIV antibody (with reflex)  Screening for  hyperlipidemia - Plan: Lipid panel  Left ear pain Jaw pain TMJ syndrome  -Ear exam overall reassuring.  Possible referred pain from TMJ pain.  No lymphadenopathy appreciated.  On Mobic as above.  We will continue same dose, handout given on TMJ syndrome.  Consider bite guard.  Also discussed underlying causes including stress, teeth grinding.  RTC precautions if worsening or new symptoms.  No orders of the defined types were placed in this encounter.  Patient Instructions  I am glad to hear that the chest symptoms have improved. I will refer you to a back specialist to decide on physical therapy or other treatments, they can also decide on MRI or other imaging if needed.  Okay to continue meloxicam for now.  See information below on back pain.  I will check some fasting labs today and we can discuss those results further at your next visit.  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  See information below.  Meloxicam should help.  If any new or worsening symptoms return for recheck here, urgent care or emergency room if needed.  Thank you for coming in today.  Chronic Back Pain When back pain lasts longer than 3 months, it is called chronic back pain. The cause of your back pain may not be known. Some common causes include: Wear and tear (degenerative disease) of the bones, ligaments, or disks in your back. Inflammation and stiffness in your back (arthritis). People who have chronic back pain often go through certain periods in which the pain is more intense (flare-ups). Many people can learn to manage the pain with home care. Follow these instructions at home: Pay attention to any changes in your symptoms. Take these actions to help with your pain: Managing pain and stiffness   If directed, apply ice to the  painful area. Your health care provider may recommend applying ice during the first 24-48 hours after a flare-up begins. To do this: Put ice in a plastic bag. Place a towel between your skin and the bag. Leave the ice on for 20 minutes, 2-3 times per day. If directed, apply heat to the affected area as often as told by your health care provider. Use the heat source that your health care provider recommends, such as a moist heat pack or a heating pad. Place a towel between your skin and the heat source. Leave the heat on for 20-30 minutes. Remove the heat if your skin turns bright red. This is especially important if you are unable to feel pain, heat, or cold. You may have a greater risk of getting burned. Try soaking in a warm tub. Activity  Avoid bending and other activities that make the problem worse. Maintain a proper position when standing or sitting: When standing, keep your upper back and neck straight, with your shoulders pulled back. Avoid slouching. When sitting, keep your back straight and relax your shoulders. Do not round your shoulders or pull them backward. Do not sit or stand in one place for long periods of time. Take brief periods of rest throughout the day. This will reduce your pain. Resting in a lying or standing position is usually better than sitting to rest. When you are resting for longer periods, mix in some mild activity or stretching between periods of rest. This will help to prevent stiffness and pain. Get regular exercise. Ask your health care provider what activities are safe for you. Do not lift anything that is heavier  than 10 lb (4.5 kg), or the limit that you are told, until your health care provider says that it is safe. Always use proper lifting technique, which includes: Bending your knees. Keeping the load close to your body. Avoiding twisting. Sleep on a firm mattress in a comfortable position. Try lying on your side with your knees slightly bent. If you  lie on your back, put a pillow under your knees. Medicines Treatment may include medicines for pain and inflammation taken by mouth or applied to the skin, prescription pain medicine, or muscle relaxants. Take over-the-counter and prescription medicines only as told by your health care provider. Ask your health care provider if the medicine prescribed to you: Requires you to avoid driving or using machinery. Can cause constipation. You may need to take these actions to prevent or treat constipation: Drink enough fluid to keep your urine pale yellow. Take over-the-counter or prescription medicines. Eat foods that are high in fiber, such as beans, whole grains, and fresh fruits and vegetables. Limit foods that are high in fat and processed sugars, such as fried or sweet foods. General instructions Do not use any products that contain nicotine or tobacco, such as cigarettes, e-cigarettes, and chewing tobacco. If you need help quitting, ask your health care provider. Keep all follow-up visits as told by your health care provider. This is important. Contact a health care provider if: You have pain that is not relieved with rest or medicine. Your pain gets worse, or you have new pain. You have a high fever. You have rapid weight loss. You have trouble doing your normal activities. Get help right away if: You have weakness or numbness in one or both of your legs or feet. You have trouble controlling your bladder or your bowels. You have severe back pain and have any of the following: Nausea or vomiting. Pain in your abdomen. Shortness of breath or you faint. Summary Chronic back pain is back pain that lasts longer than 3 months. When a flare-up begins, apply ice to the painful area for the first 24-48 hours. Apply a moist heat pad or use a heating pad on the painful area as directed by your health care provider. When you are resting for longer periods, mix in some mild activity or stretching  between periods of rest. This will help to prevent stiffness and pain. This information is not intended to replace advice given to you by your health care provider. Make sure you discuss any questions you have with your health care provider. Document Revised: 01/25/2020 Document Reviewed: 01/25/2020 Elsevier Patient Education  2022 [employer].   Temporomandibular Joint Syndrome Temporomandibular joint syndrome (TMJ syndrome) is a condition that causes pain in the temporomandibular joints. These joints are located near your ears and allow your jaw to open and close. For people with TMJ syndrome, chewing, biting, or other movements of the jaw can be difficult or painful. TMJ syndrome is often mild and goes away within a few weeks. However, sometimes the condition becomes a long-term (chronic) problem. What are the causes? This condition may be caused by: Grinding your teeth or clenching your jaw. Some people do this when they are stressed. Arthritis. An injury to the jaw. A head or neck injury. Teeth or dentures that are not aligned well. In some cases, the cause of TMJ syndrome may not be known. What are the signs or symptoms? The most common symptom of this condition is aching pain on the side of the head in the  area of the TMJ. Other symptoms may include: Pain when moving your jaw, such as when chewing or biting. Not being able to open your jaw all the way. Making a clicking sound when you open your mouth. Headache. Earache. Neck or shoulder pain. How is this diagnosed? This condition may be diagnosed based on: Your symptoms and medical history. A physical exam. Your health care provider may check the range of motion of your jaw. Imaging tests, such as X-rays or an MRI. You may also need to see your dentist, who will check if your teeth and jaw are lined up correctly. How is this treated? TMJ syndrome often goes away on its own. If treatment is needed, it may include: Eating soft  foods and applying ice or heat. Medicines to relieve pain or inflammation. Medicines or massage to relax the muscles. A splint, bite plate, or mouthpiece to prevent teeth grinding or jaw clenching. Relaxation techniques or counseling to help reduce stress. A therapy for pain in which an electrical current is applied to the nerves through the skin (transcutaneous electrical nerve stimulation). Acupuncture. This may help to relieve pain. Jaw surgery. This is rarely needed. Follow these instructions at home: Eating and drinking Eat a soft diet if you are having trouble chewing. Avoid foods that require a lot of chewing. Do not chew gum. General instructions Take over-the-counter and prescription medicines only as told by your health care provider. If directed, put ice on the painful area. To do this: Put ice in a plastic bag. Place a towel between your skin and the bag. Leave the ice on for 20 minutes, 2-3 times a day. Remove the ice if your skin turns bright red. This is very important. If you cannot feel pain, heat, or cold, you have a greater risk of damage to the area. Apply a warm, wet cloth (warm compress) to the painful area as told. Massage your jaw area and do any jaw stretching exercises as told by your health care provider. If you were given a splint, bite plate, or mouthpiece, wear it as told by your health care provider. Keep all follow-up visits. This is important. Where to find more information [place]: [email or web address]-olson.[email or web address]/ Contact a health care provider if: You have trouble eating. You have new or worsening symptoms. Get help right away if: Your jaw locks. Summary Temporomandibular joint syndrome (TMJ syndrome) is a condition that causes pain in the temporomandibular joints. These joints are located near your ears and allow your jaw to open and close. TMJ syndrome is often mild and goes away within a few weeks. However,  sometimes the condition becomes a long-term (chronic) problem. Symptoms include an aching pain on the side of the head in the area of the TMJ, pain when chewing or biting, and being unable to open your jaw all the way. You may also make a clicking sound when you open your mouth. TMJ syndrome often goes away on its own. If treatment is needed, it may include medicines to relieve pain, reduce inflammation, or relax the muscles. A splint, bite plate, or mouthpiece may also be used to prevent teeth grinding or jaw clenching. This information is not intended to replace advice given to you by your health care provider. Make sure you discuss any questions you have with your health care provider. Document Revised: 07/28/2021 Document Reviewed: 07/28/2021 Elsevier Patient Education  2022 [employer].      Signed,   Merri Ray, MD [facility] 02/27/22 10:12 AM

## 2022-02-28 LAB — HIV ANTIBODY (ROUTINE TESTING W REFLEX): HIV 1&2 Ab, 4th Generation: NONREACTIVE

## 2022-02-28 LAB — HEPATITIS C ANTIBODY
Hepatitis C Ab: NONREACTIVE
SIGNAL TO CUT-OFF: 0.02 (ref <1.00)

## 2022-03-12 ENCOUNTER — Encounter (HOSPITAL_BASED_OUTPATIENT_CLINIC_OR_DEPARTMENT_OTHER): Payer: BC Managed Care – PPO | Admitting: Cardiology

## 2022-03-20 ENCOUNTER — Other Ambulatory Visit: Payer: Self-pay

## 2022-03-20 ENCOUNTER — Ambulatory Visit (INDEPENDENT_AMBULATORY_CARE_PROVIDER_SITE_OTHER): Payer: 59 | Admitting: Orthopaedic Surgery

## 2022-03-20 DIAGNOSIS — S76012A Strain of muscle, fascia and tendon of left hip, initial encounter: Secondary | ICD-10-CM

## 2022-03-20 DIAGNOSIS — M25552 Pain in left hip: Secondary | ICD-10-CM | POA: Diagnosis not present

## 2022-03-20 DIAGNOSIS — G8929 Other chronic pain: Secondary | ICD-10-CM | POA: Diagnosis not present

## 2022-03-20 NOTE — Progress Notes (Signed)
? ?                            ? ? ?Chief Complaint: Left hip and back pain ?  ? ? ?History of Present Illness:  ? ? ?Terri Wiley is a 46 y.o. female presents today for ongoing left hip pain.  She has previously been seen by Dr. Nyoka Cowden who recommended referral to spine surgery as there is a concern for radiation down the leg.  She states that she does have a history of scoliosis.  She has tenderness about the lateral aspect of the left hip.  She has a difficult time laying on that side.  She has a difficult time going up and down stairs.  She currently works as a second Land and this hip pain does significantly interfere with her ability to do this. ? ? ? ?Surgical History:   ?None ? ?PMH/PSH/Family History/Social History/Meds/Allergies:   ? ?Past Medical History:  ?Diagnosis Date  ? GERD (gastroesophageal reflux disease)   ? Hernia cerebri ([place])   ? Papanicolaou smear of cervix with positive high risk human papilloma virus (HPV) test 06/24/2021  ? 06/24/21 repeat pap in 1 year per ASCCP guidelines   ? Trichimoniasis 06/24/2021  ? Treated 06/24/21, POC___________  ? ?Past Surgical History:  ?Procedure Laterality Date  ? HERNIA REPAIR    ? tubiligation    ? ?Social History  ? ?Socioeconomic History  ? Marital status: Divorced  ?  Spouse name: Not on file  ? Number of children: Not on file  ? Years of education: Not on file  ? Highest education level: Not on file  ?Occupational History  ? Not on file  ?Tobacco Use  ? Smoking status: Never  ? Smokeless tobacco: Never  ?Vaping Use  ? Vaping Use: Never used  ?Substance and Sexual Activity  ? Alcohol use: No  ? Drug use: No  ? Sexual activity: Yes  ?  Birth control/protection: Surgical  ?  Comment: tubal  ?Other Topics Concern  ? Not on file  ?Social History Narrative  ? Not on file  ? ?Social Determinants of Health  ? ?Financial Resource Strain: Medium Risk  ? Difficulty of Paying Living Expenses: Somewhat hard  ?Food Insecurity: No Food Insecurity  ? Worried About  Charity fundraiser in the Last Year: Never true  ? Ran Out of Food in the Last Year: Never true  ?Transportation Needs: No Transportation Needs  ? Lack of Transportation (Medical): No  ? Lack of Transportation (Non-Medical): No  ?Physical Activity: Insufficiently Active  ? Days of Exercise per Week: 2 days  ? Minutes of Exercise per Session: 30 min  ?Stress: Stress Concern Present  ? Feeling of Stress : To some extent  ?Social Connections: Moderately Isolated  ? Frequency of Communication with Friends and Family: Three times a week  ? Frequency of Social Gatherings with Friends and Family: Once a week  ? Attends Religious Services: 1 to 4 times per year  ? Active Member of Clubs or Organizations: No  ? Attends Archivist Meetings: Never  ? Marital Status: Divorced  ? ?Family History  ?Problem Relation Age of Onset  ? Liver disease Mother   ? Hypertension Mother   ? Diabetes Mother   ? Aneurysm Father   ? Hypertension Father   ? ?Allergies  ?Allergen Reactions  ? Bee Venom Shortness Of Breath  ? Penicillins Hives  ? ?  Current Outpatient Medications  ?Medication Sig Dispense Refill  ? acetaminophen (TYLENOL) 325 MG tablet Take 650 mg by mouth every 6 (six) hours as needed. pain    ? aspirin 81 MG EC tablet Take 81 mg by mouth daily. Swallow whole.    ? cetirizine (ZYRTEC) 10 MG tablet Take 10 mg by mouth daily.    ? FEROSUL 325 (65 Fe) MG tablet Take 325 mg by mouth daily.    ? furosemide (LASIX) 40 MG tablet Take 40 mg by mouth daily as needed for fluid or edema.    ? ibuprofen (ADVIL) 800 MG tablet Take 800 mg by mouth every 6 (six) hours as needed. For pain    ? ipratropium (ATROVENT) 0.06 % nasal spray Place 2 sprays into both nostrils 3 (three) times daily.    ? LORazepam (ATIVAN) 0.5 MG tablet Take 0.5 mg by mouth daily as needed for anxiety.    ? meloxicam (MOBIC) 7.5 MG tablet Take 1 tablet (7.5 mg total) by mouth daily. 30 tablet 0  ? Norethindrone-Ethinyl Estradiol-Fe Biphas (LO LOESTRIN FE) 1  MG-10 MCG / 10 MCG tablet Take 1 tablet by mouth daily. Take 1 daily by mouth 84 tablet 0  ? pantoprazole (PROTONIX) 20 MG tablet Take 1 tablet (20 mg total) by mouth daily. 42 tablet 0  ? ?No current facility-administered medications for this visit.  ? ?No results found. ? ?Review of Systems:   ?A ROS was performed including pertinent positives and negatives as documented in the HPI. ? ?Physical Exam :   ?Constitutional: NAD and appears stated age ?Neurological: Alert and oriented ?Psych: Appropriate affect and cooperative ?There were no vitals taken for this visit.  ? ?Comprehensive Musculoskeletal Exam:   ? ?Inspection Right Left  ?Skin No atrophy or gross abnormalities appreciated No atrophy or gross abnormalities appreciated  ?Palpation    ?Tenderness None None  ?Crepitus None None  ?Range of Motion    ?Flexion (passive) 120 120  ?Extension 30 30  ?IR 30 30  ?ER 45 45  ?Strength    ?Flexion  5/5 5/5  ?Extension 5/5 5/5  ?Special Tests    ?FABER Negative Negative  ?FADIR Negative Negative  ?ER Lag/Capsular Insufficiency Negative Negative  ?Instability Negative Negative  ?Sacroiliac pain Negative  Negative   ?Instability    ?Generalized Laxity No No  ?Neurologic    ?sciatic, femoral, obturator nerves intact to light sensation  ?Vascular/Lymphatic    ?DP pulse 2+ 2+  ?Lumbar Exam    ?Patient has symmetric lumbar range of motion with negative pain referral to hip  ?Pain with resisted abduction of the left hip with weakness ? ?Imaging:   ? ? ?I personally reviewed and interpreted the radiographs. ? ? ?Assessment:   ?46 year old female with left hip pain consistent with gluteus medius weakness and tendinitis.  Overall I recommended ultrasound-guided injection of this left hip so that we can further assess if this is truly the underlying cause of her pain.  I would like her to undergo a left hip strengthening program as she does have fairly significant weakness on this hip and I believe she would benefit from  strengthening. ? ?Plan :   ? ?-Return to clinic in 4 to 6 weeks for reassessment ? ? ? ? ?I personally saw and evaluated the patient, and participated in the management and treatment plan. ? ?Vanetta Mulders, MD ?Attending Physician, Orthopedic Surgery ? ?This document was dictated using Systems analyst. A reasonable attempt at proof  reading has been made to minimize errors. ?

## 2022-03-24 ENCOUNTER — Ambulatory Visit: Payer: 59 | Admitting: Family Medicine

## 2022-04-12 ENCOUNTER — Other Ambulatory Visit: Payer: Self-pay | Admitting: Family Medicine

## 2022-04-13 MED ORDER — CETIRIZINE HCL 10 MG PO TABS: 10.0000 mg | ORAL_TABLET | Freq: Every day | ORAL | 0 refills | Status: DC

## 2022-04-17 ENCOUNTER — Emergency Department (HOSPITAL_COMMUNITY): Payer: 59

## 2022-04-17 ENCOUNTER — Encounter: Payer: Self-pay | Admitting: Family Medicine

## 2022-04-17 ENCOUNTER — Other Ambulatory Visit: Payer: Self-pay

## 2022-04-17 ENCOUNTER — Emergency Department (HOSPITAL_COMMUNITY)
Admission: EM | Admit: 2022-04-17 | Discharge: 2022-04-17 | Disposition: A | Discharge status: Home / Self Care | Payer: 59 | Attending: Emergency Medicine | Admitting: Emergency Medicine

## 2022-04-17 ENCOUNTER — Ambulatory Visit (HOSPITAL_BASED_OUTPATIENT_CLINIC_OR_DEPARTMENT_OTHER): Payer: 59 | Admitting: Orthopaedic Surgery

## 2022-04-17 ENCOUNTER — Encounter (HOSPITAL_COMMUNITY): Payer: Self-pay | Admitting: *Deleted

## 2022-04-17 DIAGNOSIS — R059 Cough, unspecified: Secondary | ICD-10-CM | POA: Insufficient documentation

## 2022-04-17 DIAGNOSIS — R1084 Generalized abdominal pain: Secondary | ICD-10-CM

## 2022-04-17 DIAGNOSIS — K449 Diaphragmatic hernia without obstruction or gangrene: Secondary | ICD-10-CM | POA: Insufficient documentation

## 2022-04-17 DIAGNOSIS — R0602 Shortness of breath: Secondary | ICD-10-CM | POA: Insufficient documentation

## 2022-04-17 DIAGNOSIS — R112 Nausea with vomiting, unspecified: Secondary | ICD-10-CM | POA: Insufficient documentation

## 2022-04-17 DIAGNOSIS — R0789 Other chest pain: Secondary | ICD-10-CM | POA: Insufficient documentation

## 2022-04-17 DIAGNOSIS — Z7982 Long term (current) use of aspirin: Secondary | ICD-10-CM | POA: Insufficient documentation

## 2022-04-17 DIAGNOSIS — R079 Chest pain, unspecified: Secondary | ICD-10-CM

## 2022-04-17 DIAGNOSIS — E876 Hypokalemia: Secondary | ICD-10-CM | POA: Insufficient documentation

## 2022-04-17 LAB — CBC
HCT: 37.7 % (ref 36.0–46.0)
Hemoglobin: 12.2 g/dL (ref 12.0–15.0)
MCH: 29 pg (ref 26.0–34.0)
MCHC: 32.4 g/dL (ref 30.0–36.0)
MCV: 89.8 fL (ref 80.0–100.0)
Platelets: 256 10*3/uL (ref 150–400)
RBC: 4.2 MIL/uL (ref 3.87–5.11)
RDW: 13.8 % (ref 11.5–15.5)
WBC: 6.6 10*3/uL (ref 4.0–10.5)
nRBC: 0 % (ref 0.0–0.2)

## 2022-04-17 LAB — COMPREHENSIVE METABOLIC PANEL
ALT: 17 U/L (ref 0–44)
AST: 16 U/L (ref 15–41)
Albumin: 3.6 g/dL (ref 3.5–5.0)
Alkaline Phosphatase: 85 U/L (ref 38–126)
Anion gap: 7 (ref 5–15)
BUN: 14 mg/dL (ref 6–20)
CO2: 24 mmol/L (ref 22–32)
Calcium: 8.8 mg/dL — ABNORMAL LOW (ref 8.9–10.3)
Chloride: 111 mmol/L (ref 98–111)
Creatinine, Ser: 0.72 mg/dL (ref 0.44–1.00)
GFR, Estimated: >60 mL/min (ref >60)
Glucose, Bld: 91 mg/dL (ref 70–99)
Potassium: 3.3 mmol/L — ABNORMAL LOW (ref 3.5–5.1)
Sodium: 142 mmol/L (ref 135–145)
Total Bilirubin: 0.5 mg/dL (ref 0.3–1.2)
Total Protein: 8 g/dL (ref 6.5–8.1)

## 2022-04-17 LAB — POC URINE PREG, ED: Preg Test, Ur: NEGATIVE

## 2022-04-17 LAB — TROPONIN I (HIGH SENSITIVITY)
Troponin I (High Sensitivity): <2 ng/L (ref <18)
Troponin I (High Sensitivity): <2 ng/L (ref <18)

## 2022-04-17 LAB — LIPASE, BLOOD: Lipase: 30 U/L (ref 11–51)

## 2022-04-17 MED ORDER — IOHEXOL 300 MG/ML  SOLN
100.0000 mL | Freq: Once | INTRAMUSCULAR | Status: AC | PRN
  Administered 2022-04-17: 100 mL via INTRAVENOUS

## 2022-04-17 MED ORDER — LIDOCAINE VISCOUS HCL 2 % MT SOLN
15.0000 mL | Freq: Once | OROMUCOSAL | Status: AC
  Administered 2022-04-17: 15 mL via ORAL
  Filled 2022-04-17: qty 15

## 2022-04-17 MED ORDER — PANTOPRAZOLE SODIUM 20 MG PO TBEC: 20.0000 mg | DELAYED_RELEASE_TABLET | Freq: Every day | ORAL | 0 refills | Status: DC

## 2022-04-17 MED ORDER — POTASSIUM CHLORIDE 20 MEQ PO PACK
40.0000 meq | PACK | Freq: Once | ORAL | Status: AC
  Administered 2022-04-17: 40 meq via ORAL
  Filled 2022-04-17: qty 2

## 2022-04-17 MED ORDER — ALUM & MAG HYDROXIDE-SIMETH 200-200-20 MG/5ML PO SUSP
30.0000 mL | Freq: Once | ORAL | Status: AC
  Administered 2022-04-17: 30 mL via ORAL
  Filled 2022-04-17: qty 30

## 2022-04-17 MED ORDER — ONDANSETRON HCL 4 MG/2ML IJ SOLN
4.0000 mg | Freq: Once | INTRAMUSCULAR | Status: AC
Start: 2022-04-17 — End: 2022-04-17
  Administered 2022-04-17: 4 mg via INTRAVENOUS
  Filled 2022-04-17: qty 2

## 2022-04-17 NOTE — ED Provider Notes (Signed)
?[facility] ?Provider Note ? ? ?CSN: [id number] ?Arrival date & time: 04/17/22  1609 ? ?  ? ?History ?Chief Complaint  ?Patient presents with  ? Chest Pain  ? ? ?Terri Wiley is a 46 y.o. female with history of GERD and hiatal hernia who presents to the emergency department with chest pain and abdominal pain has been ongoing but worse over the last 24 hours.  With respect to her chest pain, she is complaining of localized left-sided chest pain which she describes as a tight sensation.  It is sometimes worse with exertion.  She also feels that her reflux is acting up which makes her cough and have shortness of breath.  This is worse after meals and when laying down.  With respect to her abdominal pain it is poorly localized and diffuse.  She does endorse 1 episode of vomiting last night and is currently nauseous.  She has not had a normal bowel movement in some time but she states this is not atypical for her as she does battle constipation.  No urinary complaints.  No fever, chills, headache, sore throat. ? ? ?Chest Pain ? ?  ? ?Home Medications ?Prior to Admission medications   ?Medication Sig Start Date End Date Taking? Authorizing Provider  ?acetaminophen (TYLENOL) 325 MG tablet Take 650 mg by mouth every 6 (six) hours as needed. pain   Yes [provider]  ?aspirin 81 MG EC tablet Take 81 mg by mouth daily. Swallow whole.   Yes [provider]  ?FEROSUL 325 (65 Fe) MG tablet Take 325 mg by mouth daily. 12/02/20  Yes [provider]  ?furosemide (LASIX) 40 MG tablet Take 40 mg by mouth daily as needed for fluid or edema. 04/30/21  Yes [provider]  ?LORazepam (ATIVAN) 0.5 MG tablet Take 0.5 mg by mouth daily as needed for anxiety. 10/08/20  Yes [provider]  ?pantoprazole (PROTONIX) 20 MG tablet Take 1 tablet (20 mg total) by mouth daily. 04/17/22  Yes Hendricks Limes, PA-C  ?Throat Lozenges (RA VITAMIN C COUGH DROPS MT) Use as directed 2  lozenges in the mouth or throat daily.   Yes [provider]  ?cetirizine (ZYRTEC) 10 MG tablet Take 1 tablet (10 mg total) by mouth daily. ?Patient not taking: Reported on 04/17/2022 04/13/22   Wendie Agreste, MD  ?meloxicam (MOBIC) 7.5 MG tablet Take 1 tablet (7.5 mg total) by mouth daily. ?Patient not taking: Reported on 04/17/2022 01/23/22   Wendie Agreste, MD  ?Norethindrone-Ethinyl Estradiol-Fe Biphas (LO LOESTRIN FE) 1 MG-10 MCG / 10 MCG tablet Take 1 tablet by mouth daily. Take 1 daily by mouth ?Patient not taking: Reported on 04/17/2022 07/11/21   Derrek Monaco A, NP  ?pantoprazole (PROTONIX) 20 MG tablet Take 1 tablet (20 mg total) by mouth daily. ?Patient not taking: Reported on 04/17/2022 12/09/21   Imogene Burn, PA-C  ?   ? ?Allergies    ?Bee venom and Penicillins   ? ?Review of Systems   ?Review of Systems  ?Cardiovascular:  Positive for chest pain.  ?All other systems reviewed and are negative. ? ?Physical Exam ?Updated Vital Signs ?BP 127/72   Pulse 69   Temp 98.1 ?F (36.7 ?C) (Oral)   Resp 16   Ht 5' 9.5" (1.765 m)   Wt 120.2 kg   SpO2 95%   BMI 38.57 kg/m?  ?Physical Exam ?Vitals and nursing note reviewed.  ?Constitutional:   ?   General: She is  not in acute distress. ?   Appearance: Normal appearance.  ?HENT:  ?   Head: Normocephalic and atraumatic.  ?Eyes:  ?   General:     ?   Right eye: No discharge.     ?   Left eye: No discharge.  ?Cardiovascular:  ?   Comments: Regular rate and rhythm.  S1/S2 are distinct without any evidence of murmur, rubs, or gallops.  Radial pulses are 2+ bilaterally.  Dorsalis pedis pulses are 2+ bilaterally.  No evidence of pedal edema. ?Pulmonary:  ?   Comments: Clear to auscultation bilaterally.  Normal effort.  No respiratory distress.  No evidence of wheezes, rales, or rhonchi heard throughout. ?Abdominal:  ?   General: Abdomen is flat. Bowel sounds are normal. There is no distension.  ?   Tenderness: There is generalized abdominal  tenderness. There is no guarding or rebound.  ?Musculoskeletal:     ?   General: Normal range of motion.  ?   Cervical back: Neck supple.  ?Skin: ?   General: Skin is warm and dry.  ?   Findings: No rash.  ?Neurological:  ?   General: No focal deficit present.  ?   Mental Status: She is alert.  ?Psychiatric:     ?   Mood and Affect: Mood normal.     ?   Behavior: Behavior normal.  ? ? ?ED Results / Procedures / Treatments   ?Labs ?(all labs ordered are listed, but only abnormal results are displayed) ?Labs Reviewed  ?COMPREHENSIVE METABOLIC PANEL - Abnormal; Notable for the following components:  ?    Result Value  ? Potassium 3.3 (*)   ? Calcium 8.8 (*)   ? All other components within normal limits  ?POC URINE PREG, ED - Normal  ?CBC  ?LIPASE, BLOOD  ?TROPONIN I (HIGH SENSITIVITY)  ?TROPONIN I (HIGH SENSITIVITY)  ? ? ?EKG ?None ? ?Radiology ?DG Chest 2 View ? ?Result Date: 04/17/2022 ?CLINICAL DATA:  Chest pain. EXAM: CHEST - 2 VIEW COMPARISON:  May 15, 2021 FINDINGS: The heart size and mediastinal contours are within normal limits. There is a large hiatal hernia. Both lungs are clear. Moderate severity dextroscoliosis of the lower thoracic and upper lumbar spine is seen. IMPRESSION: 1. No active cardiopulmonary disease. 2. Large hiatal hernia. Electronically Signed   By: Virgina Norfolk M.D.   On: 04/17/2022 16:46  ? ?CT ABDOMEN PELVIS W CONTRAST ? ?Result Date: 04/17/2022 ?CLINICAL DATA:  Abdominal pain, acute, nonlocalized diffuse abdominal pain EXAM: CT ABDOMEN AND PELVIS WITH CONTRAST TECHNIQUE: Multidetector CT imaging of the abdomen and pelvis was performed using the standard protocol following bolus administration of intravenous contrast. RADIATION DOSE REDUCTION: This exam was performed according to the departmental dose-optimization program which includes automated exposure control, adjustment of the mA and/or kV according to patient size and/or use of iterative reconstruction technique. CONTRAST:  132m  OMNIPAQUE IOHEXOL 300 MG/ML  SOLN COMPARISON:  04/18/2021 FINDINGS: Lower chest: Large hiatal hernia again noted, similar to prior examination, the gastric fundus located within the intrathoracic compartment. Cardiac size within normal limits. Visualized lung bases are clear. Hepatobiliary: No focal liver abnormality is seen. No gallstones, gallbladder wall thickening, or biliary dilatation. Pancreas: Unremarkable Spleen: Unremarkable Adrenals/Urinary Tract: The adrenal glands are unremarkable. The kidneys are normal in size and position. Multiple parapelvic cysts are seen bilaterally, benign. No further follow-up imaging is recommended for these lesions. 3 mm nonobstructing calculus noted within the lower pole the left kidney. No hydronephrosis. No  ureteral calculi. The bladder is unremarkable. Stomach/Bowel: Stomach is within normal limits. Appendix appears normal. No evidence of bowel wall thickening, distention, or inflammatory changes. Vascular/Lymphatic: No significant vascular findings are present. No enlarged abdominal or pelvic lymph nodes. Reproductive: Multiple small exophytic uterine fibroids arise from the anterior fundus. The right ovary contains a 3.8 cm simple cyst. No follow-up imaging is recommended in the premenopausal patient. The pelvic organs are otherwise unremarkable. Other: No abdominal wall hernia or abnormality. No abdominopelvic ascites. Musculoskeletal: No acute bone abnormality. No lytic or blastic bone lesion. Osseous structures are age-appropriate. IMPRESSION: No acute intra-abdominal pathology identified. No definite radiographic explanation for the patient's reported symptoms. Large hiatal hernia, similar to prior examination. Mild left nonobstructing nephrolithiasis. Fibroid uterus. Electronically Signed   By: Fidela Salisbury M.D.   On: 04/17/2022 20:09   ? ?Procedures ?Procedures  ? ? ?Medications Ordered in ED ?Medications  ?alum & mag hydroxide-simeth (MAALOX/MYLANTA) 200-200-20  MG/5ML suspension 30 mL (30 mLs Oral Given 04/17/22 1752)  ?  And  ?lidocaine (XYLOCAINE) 2 % viscous mouth solution 15 mL (15 mLs Oral Given 04/17/22 1752)  ?ondansetron (ZOFRAN) injection 4 mg (4 mg Intravenous Given 4/20/2

## 2022-04-17 NOTE — Discharge Instructions (Signed)
Please follow up with your primary care doctor for further evaluation.  Please return to the emergency department any worsening symptoms you might have. ?

## 2022-04-17 NOTE — ED Triage Notes (Signed)
Pt c/o chest pain and upper abdominal pain x one day; pt has taken pills for gas with no relief ?

## 2022-04-21 ENCOUNTER — Ambulatory Visit: Payer: 59 | Admitting: Family Medicine

## 2022-04-24 ENCOUNTER — Encounter: Payer: Self-pay | Admitting: Family Medicine

## 2022-04-25 ENCOUNTER — Ambulatory Visit (INDEPENDENT_AMBULATORY_CARE_PROVIDER_SITE_OTHER): Payer: 59 | Admitting: Family Medicine

## 2022-04-25 ENCOUNTER — Encounter: Payer: Self-pay | Admitting: Family Medicine

## 2022-04-25 VITALS — BP 138/76 | HR 84 | Temp 98.0°F | Resp 15 | Ht 69.5 in | Wt 270.0 lb

## 2022-04-25 DIAGNOSIS — K5900 Constipation, unspecified: Secondary | ICD-10-CM

## 2022-04-25 DIAGNOSIS — K219 Gastro-esophageal reflux disease without esophagitis: Secondary | ICD-10-CM

## 2022-04-25 DIAGNOSIS — K449 Diaphragmatic hernia without obstruction or gangrene: Secondary | ICD-10-CM

## 2022-04-25 DIAGNOSIS — R519 Headache, unspecified: Secondary | ICD-10-CM | POA: Diagnosis not present

## 2022-04-25 DIAGNOSIS — N83209 Unspecified ovarian cyst, unspecified side: Secondary | ICD-10-CM

## 2022-04-25 DIAGNOSIS — R079 Chest pain, unspecified: Secondary | ICD-10-CM | POA: Diagnosis not present

## 2022-04-25 DIAGNOSIS — E876 Hypokalemia: Secondary | ICD-10-CM | POA: Diagnosis not present

## 2022-04-25 DIAGNOSIS — N200 Calculus of kidney: Secondary | ICD-10-CM

## 2022-04-25 DIAGNOSIS — M26629 Arthralgia of temporomandibular joint, unspecified side: Secondary | ICD-10-CM

## 2022-04-25 LAB — COMPREHENSIVE METABOLIC PANEL
ALT: 11 U/L (ref 0–35)
AST: 12 U/L (ref 0–37)
Albumin: 3.4 g/dL — ABNORMAL LOW (ref 3.5–5.2)
Alkaline Phosphatase: 82 U/L (ref 39–117)
BUN: 13 mg/dL (ref 6–23)
CO2: 28 mEq/L (ref 19–32)
Calcium: 8.6 mg/dL (ref 8.4–10.5)
Chloride: 105 mEq/L (ref 96–112)
Creatinine, Ser: 0.67 mg/dL (ref 0.40–1.20)
GFR: 103.75 mL/min (ref >60.00)
Glucose, Bld: 100 mg/dL — ABNORMAL HIGH (ref 70–99)
Potassium: 4 mEq/L (ref 3.5–5.1)
Sodium: 140 mEq/L (ref 135–145)
Total Bilirubin: 0.4 mg/dL (ref 0.2–1.2)
Total Protein: 6.7 g/dL (ref 6.0–8.3)

## 2022-04-25 MED ORDER — PANTOPRAZOLE SODIUM 40 MG PO TBEC: 40.0000 mg | DELAYED_RELEASE_TABLET | Freq: Every day | ORAL | 3 refills | Status: DC

## 2022-04-25 NOTE — Progress Notes (Signed)
? ?Subjective:  ?Patient ID: Terri Wiley, female    DOB: Mar 13, 1976  Age: 46 y.o. MRN: [id number] ? ?CC:  ?Chief Complaint  ?Patient presents with  ? Follow-up  ?  Pt here for ED follow up had CT scan and was told she has a gall stone, ovarian cyst, and her hiatal hernia has grown in size. Was advised to follow up with PCP on lab results   ? Ear Fullness  ?  Pt reports continued ear pressure in Lt ear has not improved since ED visit, told by ED to follow up with PCP possibly see ENT   ? ? ?HPI ?[name] presents for  ? ?ER follow-up.  Evaluated 04/17/2022.  Notes reviewed ? ?Chest pain, abdominal pain ?ER notes reviewed.  Chest abdominal pain for approximately 24 hours, left-sided chest pain tight sensation, worse with exertion, worsening reflux symptoms triggering cough and dyspnea.  Worse after meals and lying down.  Diffuse abdominal pain with 1 episode of vomiting.  History of constipation.   ?Negative hCG, troponin negative x2, normal lipase, hypokalemia with potassium 3.3 (treated in ER, no current supplement),  borderline calcium of 8.8 but otherwise CMP within normal range.  CBC normal.  Checks x-ray without cardiopulmonary disease, large hiatal hernia noted.  Dextroscoliosis of lower thoracic and upper lumbar spine.  CT abdomen pelvis with no acute intra-abdominal pathology identified.  There is a large hiatal hernia, similar to prior exam.  Mild left nonobstructing nephrolithiasis, 3 mm with in the lower pole the left kidney.  No ureteral calculi.  No gallstones.  Fibroid uterus.  Right ovary with 3.8 cm simple cyst.  No follow-up imaging recommended.  GI cocktail was given.  Symptoms improved based on ER notes. Possible increased size of hiatal hernia.  ?Continued protonix once per day.  ? ?Feels better since ER. Min soreness in chest - upper chest. Frequent belching, hiccups. Some flairs of reflux at times. Trying to avoid late meals, and trigger foods - helps some. Avoiding caffeine. No alcohol.   ?Ibuprofen 1-2 per day.  ?No current chest pain  ?Good vacation at [facility] over spring break.  ?Gynecology - followed by Derrek Monaco.  ?Hard stools. BM once per week.  ? ?Left ear fullness, left face pain.  ?Discussed left jaw soreness at her March 2 visit with soreness into her left ear.  No change in hearing.  Pain in the ear to left jaw area, with some popping and clicking.  Had noticed some stress, grinding of teeth.  Suspected referred pain from TMJ pain.  She is on meloxicam, was continued same.  Handout given and discussed considering bite guard. ? ?Pain about the same.still pain in front of ear, upper jaw sore. Some clicking still.  Still grinding teeth. Cold or hot compress helps some days. Meloxicam as needed - has cut back since ER. Left face feels swollen.  ?No fever.  ?No nasal discharge.  ?Dentist: Dr.Best, last visit few months ago.  ?Not feeling stressed recently.  ? ?History ?Patient Active Problem List  ? Diagnosis Date Noted  ? LLQ pain 07/11/2021  ? Hot flashes 07/11/2021  ? Papanicolaou smear of cervix with positive high risk human papilloma virus (HPV) test 06/24/2021  ? Trichimoniasis 06/24/2021  ? Menorrhagia with irregular cycle 06/19/2021  ? Fibroids 06/19/2021  ? Perimenopause 06/19/2021  ? Routine Papanicolaou smear 06/19/2021  ? Chronic left hip pain 02/28/2021  ? Mass of left hip region 02/28/2021  ? ?Past Medical History:  ?Diagnosis Date  ?  GERD (gastroesophageal reflux disease)   ? Hernia cerebri ([place])   ? Papanicolaou smear of cervix with positive high risk human papilloma virus (HPV) test 06/24/2021  ? 06/24/21 repeat pap in 1 year per ASCCP guidelines   ? Trichimoniasis 06/24/2021  ? Treated 06/24/21, POC___________  ? ?Past Surgical History:  ?Procedure Laterality Date  ? HERNIA REPAIR    ? tubiligation    ? ?Allergies  ?Allergen Reactions  ? Bee Venom Shortness Of Breath  ? Penicillins Hives  ? ?Prior to Admission medications   ?Medication Sig Start Date End Date Taking?  Authorizing Provider  ?acetaminophen (TYLENOL) 325 MG tablet Take 650 mg by mouth every 6 (six) hours as needed. pain   Yes [provider]  ?aspirin 81 MG EC tablet Take 81 mg by mouth daily. Swallow whole.   Yes [provider]  ?FEROSUL 325 (65 Fe) MG tablet Take 325 mg by mouth daily. 12/02/20  Yes [provider]  ?furosemide (LASIX) 40 MG tablet Take 40 mg by mouth daily as needed for fluid or edema. 04/30/21  Yes [provider]  ?LORazepam (ATIVAN) 0.5 MG tablet Take 0.5 mg by mouth daily as needed for anxiety. 10/08/20  Yes [provider]  ?pantoprazole (PROTONIX) 20 MG tablet Take 1 tablet (20 mg total) by mouth daily. 12/09/21  Yes Imogene Burn, PA-C  ?pantoprazole (PROTONIX) 20 MG tablet Take 1 tablet (20 mg total) by mouth daily. 04/17/22  Yes Hendricks Limes, PA-C  ?Throat Lozenges (RA VITAMIN C COUGH DROPS MT) Use as directed 2 lozenges in the mouth or throat daily.   Yes [provider]  ?cetirizine (ZYRTEC) 10 MG tablet Take 1 tablet (10 mg total) by mouth daily. ?Patient not taking: Reported on 04/17/2022 04/13/22   Wendie Agreste, MD  ?meloxicam (MOBIC) 7.5 MG tablet Take 1 tablet (7.5 mg total) by mouth daily. ?Patient not taking: Reported on 04/17/2022 01/23/22   Wendie Agreste, MD  ?Norethindrone-Ethinyl Estradiol-Fe Biphas (LO LOESTRIN FE) 1 MG-10 MCG / 10 MCG tablet Take 1 tablet by mouth daily. Take 1 daily by mouth ?Patient not taking: Reported on 04/17/2022 07/11/21   Estill Dooms, NP  ? ?Social History  ? ?Socioeconomic History  ? Marital status: Divorced  ?  Spouse name: Not on file  ? Number of children: Not on file  ? Years of education: Not on file  ? Highest education level: Not on file  ?Occupational History  ? Not on file  ?Tobacco Use  ? Smoking status: Never  ? Smokeless tobacco: Never  ?Vaping Use  ? Vaping Use: Never used  ?Substance and Sexual Activity  ? Alcohol use: No  ? Drug use: No  ? Sexual activity: Yes  ?   Birth control/protection: Surgical  ?  Comment: tubal  ?Other Topics Concern  ? Not on file  ?Social History Narrative  ? Not on file  ? ?Social Determinants of Health  ? ?Financial Resource Strain: Medium Risk  ? Difficulty of Paying Living Expenses: Somewhat hard  ?Food Insecurity: No Food Insecurity  ? Worried About Charity fundraiser in the Last Year: Never true  ? Ran Out of Food in the Last Year: Never true  ?Transportation Needs: No Transportation Needs  ? Lack of Transportation (Medical): No  ? Lack of Transportation (Non-Medical): No  ?Physical Activity: Insufficiently Active  ? Days of Exercise per Week: 2 days  ? Minutes of Exercise per Session: 30 min  ?Stress:  Stress Concern Present  ? Feeling of Stress : To some extent  ?Social Connections: Moderately Isolated  ? Frequency of Communication with Friends and Family: Three times a week  ? Frequency of Social Gatherings with Friends and Family: Once a week  ? Attends Religious Services: 1 to 4 times per year  ? Active Member of Clubs or Organizations: No  ? Attends Archivist Meetings: Never  ? Marital Status: Divorced  ?Intimate Partner Violence: Not At Risk  ? Fear of Current or Ex-Partner: No  ? Emotionally Abused: No  ? Physically Abused: No  ? Sexually Abused: No  ? ? ?Review of Systems ?Per HPI.  ? ?Objective:  ? ?Vitals:  ? 04/25/22 1045  ?BP: 138/76  ?Pulse: 84  ?Resp: 15  ?Temp: 98 ?F (36.7 ?C)  ?TempSrc: Temporal  ?SpO2: 96%  ?Weight: 270 lb (122.5 kg)  ?Height: 5' 9.5" (1.765 m)  ? ? ? ?Physical Exam ?Vitals reviewed.  ?Constitutional:   ?   Appearance: Normal appearance. She is well-developed.  ?HENT:  ?   Head: Normocephalic and atraumatic.  ? ?   Right Ear: Tympanic membrane, ear canal and external ear normal.  ?   Left Ear: Tympanic membrane, ear canal and external ear normal.  ?   Ears:  ?   Comments: Pinna nontender on left without erythema or swelling.  Canal clear, TM pearly gray.  Mastoid nontender. ?Eyes:  ?    Conjunctiva/sclera: Conjunctivae normal.  ?   Pupils: Pupils are equal, round, and reactive to light.  ?Neck:  ?   Vascular: No carotid bruit.  ?Cardiovascular:  ?   Rate and Rhythm: Normal rate and regular rhythm.  ?

## 2022-04-25 NOTE — Patient Instructions (Addendum)
Increase to 40 mg of protonix (ok to take 2 of the current protonix until new prescription started). I will refer you to gastroenterology initially, but may also need to meet with surgeon if hiatal hernia is primary issue. Can discuss initially with gastroenterology. Continue to avoid trigger foods for heartburn see below.  ?Try to decrease ibuprofen - tylenol is ok.  ? 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 With heartburn, I would avoid antiinflammatories like ibuprofen or meloxicam if possible for now. I will order CT scan to look at that area further, but also discuss with dentist as teeth grinding may be contributing.  ? ?Ovarian cyst does not appear concerning, but should discuss with your gynecologist.  ? ?Small kidney stone does not appear concerning.  ? ?Repeat bloodwork today.  ? ?Fiber in diet, miralax if needed for constipation.  ? ?Return to the clinic or go to the nearest emergency room if any of your symptoms worsen or new symptoms occur. ? ?Constipation, Adult ?Constipation is when a person has fewer than three bowel movements in a week, has difficulty having a bowel movement, or has stools (feces) that are dry, hard, or larger than normal. Constipation may be caused by an underlying condition. It may become worse with age if a person takes certain medicines and does not take in enough fluids. ?Follow these instructions at home: ?Eating and drinking ? ?Eat foods that have a lot of fiber, such as beans, whole grains, and fresh fruits and vegetables. ?Limit foods that are low in fiber and high in fat and processed sugars, such as fried or sweet foods. These include french fries, hamburgers, cookies, candies, and soda. ?Drink enough fluid to keep your urine pale yellow. ?General instructions ?Exercise regularly or as told by your health care provider. Try to do 150 minutes of moderate exercise each week. ?Use the bathroom when you have the urge to go. Do not hold it in. ?Take over-the-counter and  prescription medicines only as told by your health care provider. This includes any fiber supplements. ?During bowel movements: ?Practice deep breathing while relaxing the lower abdomen. ?Practice pelvic floor relaxation. ?Watch your condition for any changes. Let your health care provider know about them. ?Keep all follow-up visits as told by your health care provider. This is important. ?Contact a health care provider if: ?You have pain that gets worse. ?You have a fever. ?You do not have a bowel movement after 4 days. ?You vomit. ?You are not hungry or you lose weight. ?You are bleeding from the opening between the buttocks (anus). ?You have thin, pencil-like stools. ?Get help right away if: ?You have a fever and your symptoms suddenly get worse. ?You leak stool or have blood in your stool. ?Your abdomen is bloated. ?You have severe pain in your abdomen. ?You feel dizzy or you faint. ?Summary ?Constipation is when a person has fewer than three bowel movements in a week, has difficulty having a bowel movement, or has stools (feces) that are dry, hard, or larger than normal. ?Eat foods that have a lot of fiber, such as beans, whole grains, and fresh fruits and vegetables. ?Drink enough fluid to keep your urine pale yellow. ?Take over-the-counter and prescription medicines only as told by your health care provider. This includes any fiber supplements. ?This information is not intended to replace advice given to you by your health care provider. Make sure you discuss any questions you have with your health care provider. ?Document Revised: 11/02/2019 Document Reviewed:  11/02/2019 ?Elsevier Patient Education ? [place]. ? ? ? ?Temporomandibular Joint Syndrome ? ?Temporomandibular joint syndrome (TMJ syndrome) is a condition that causes pain in the temporomandibular joints. These joints are located near your ears and allow your jaw to open and close. For people with TMJ syndrome, chewing, biting, or other  movements of the jaw can be difficult or painful. ?TMJ syndrome is often mild and goes away within a few weeks. However, sometimes the condition becomes a long-term (chronic) problem. ?What are the causes? ?This condition may be caused by: ?Grinding your teeth or clenching your jaw. Some people do this when they are stressed. ?Arthritis. ?An injury to the jaw. ?A head or neck injury. ?Teeth or dentures that are not aligned well. ?In some cases, the cause of TMJ syndrome may not be known. ?What are the signs or symptoms? ?The most common symptom of this condition is aching pain on the side of the head in the area of the TMJ. Other symptoms may include: ?Pain when moving your jaw, such as when chewing or biting. ?Not being able to open your jaw all the way. ?Making a clicking sound when you open your mouth. ?Headache. ?Earache. ?Neck or shoulder pain. ?How is this diagnosed? ?This condition may be diagnosed based on: ?Your symptoms and medical history. ?A physical exam. Your health care provider may check the range of motion of your jaw. ?Imaging tests, such as X-rays or an MRI. ?You may also need to see your dentist, who will check if your teeth and jaw are lined up correctly. ?How is this treated? ?TMJ syndrome often goes away on its own. If treatment is needed, it may include: ?Eating soft foods and applying ice or heat. ?Medicines to relieve pain or inflammation. ?Medicines or massage to relax the muscles. ?A splint, bite plate, or mouthpiece to prevent teeth grinding or jaw clenching. ?Relaxation techniques or counseling to help reduce stress. ?A therapy for pain in which an electrical current is applied to the nerves through the skin (transcutaneous electrical nerve stimulation). ?Acupuncture. This may help to relieve pain. ?Jaw surgery. This is rarely needed. ?Follow these instructions at home: ? ?Eating and drinking ?Eat a soft diet if you are having trouble chewing. ?Avoid foods that require a lot of chewing.  Do not chew gum. ?General instructions ?Take over-the-counter and prescription medicines only as told by your health care provider. ?If directed, put ice on the painful area. To do this: ?Put ice in a plastic bag. ?Place a towel between your skin and the bag. ?Leave the ice on for 20 minutes, 2-3 times a day. ?Remove the ice if your skin turns bright red. This is very important. If you cannot feel pain, heat, or cold, you have a greater risk of damage to the area. ?Apply a warm, wet cloth (warm compress) to the painful area as told. ?Massage your jaw area and do any jaw stretching exercises as told by your health care provider. ?If you were given a splint, bite plate, or mouthpiece, wear it as told by your health care provider. ?Keep all follow-up visits. This is important. ?Where to find more information ?[employer]: [email or web address]://avila-olson.[email or web address]/ ?Contact a health care provider if: ?You have trouble eating. ?You have new or worsening symptoms. ?Get help right away if: ?Your jaw locks. ?Summary ?Temporomandibular joint syndrome (TMJ syndrome) is a condition that causes pain in the temporomandibular joints. These joints are located near your ears and allow your jaw to  open and close. ?TMJ syndrome is often mild and goes away within a few weeks. However, sometimes the condition becomes a long-term (chronic) problem. ?Symptoms include an aching pain on the side of the head in the area of the TMJ, pain when chewing or biting, and being unable to open your jaw all the way. You may also make a clicking sound when you open your mouth. ?TMJ syndrome often goes away on its own. If treatment is needed, it may include medicines to relieve pain, reduce inflammation, or relax the muscles. A splint, bite plate, or mouthpiece may also be used to prevent teeth grinding or jaw clenching. ?This information is not intended to replace advice given to you by your health care provider. Make sure you  discuss any questions you have with your health care provider. ?Document Revised: 07/28/2021 Document Reviewed: 07/28/2021 ?Elsevier Patient Education ? [place]. ? ? ?Food Choices for Gastroesophageal

## 2022-04-28 ENCOUNTER — Ambulatory Visit (HOSPITAL_BASED_OUTPATIENT_CLINIC_OR_DEPARTMENT_OTHER): Payer: 59 | Admitting: Physical Therapy

## 2022-05-06 ENCOUNTER — Ambulatory Visit (INDEPENDENT_AMBULATORY_CARE_PROVIDER_SITE_OTHER)
Admission: RE | Admit: 2022-05-06 | Discharge: 2022-05-06 | Disposition: A | Discharge status: Home / Self Care | Payer: 59 | Source: Ambulatory Visit | Attending: Family Medicine | Admitting: Family Medicine

## 2022-05-06 ENCOUNTER — Encounter: Payer: Self-pay | Admitting: Radiology

## 2022-05-06 DIAGNOSIS — R519 Headache, unspecified: Secondary | ICD-10-CM

## 2022-05-06 DIAGNOSIS — M26629 Arthralgia of temporomandibular joint, unspecified side: Secondary | ICD-10-CM | POA: Diagnosis not present

## 2022-05-14 ENCOUNTER — Ambulatory Visit: Payer: 59 | Admitting: Family Medicine

## 2022-05-14 ENCOUNTER — Ambulatory Visit (INDEPENDENT_AMBULATORY_CARE_PROVIDER_SITE_OTHER): Payer: 59 | Admitting: Family Medicine

## 2022-05-14 ENCOUNTER — Other Ambulatory Visit: Payer: Self-pay

## 2022-05-14 ENCOUNTER — Encounter: Payer: Self-pay | Admitting: Family Medicine

## 2022-05-14 VITALS — BP 126/72 | HR 68 | Temp 98.2°F | Resp 18 | Ht 69.5 in | Wt 269.6 lb

## 2022-05-14 DIAGNOSIS — R519 Headache, unspecified: Secondary | ICD-10-CM | POA: Diagnosis not present

## 2022-05-14 DIAGNOSIS — E8809 Other disorders of plasma-protein metabolism, not elsewhere classified: Secondary | ICD-10-CM

## 2022-05-14 DIAGNOSIS — N6002 Solitary cyst of left breast: Secondary | ICD-10-CM | POA: Diagnosis not present

## 2022-05-14 DIAGNOSIS — K449 Diaphragmatic hernia without obstruction or gangrene: Secondary | ICD-10-CM

## 2022-05-14 DIAGNOSIS — R222 Localized swelling, mass and lump, trunk: Secondary | ICD-10-CM

## 2022-05-14 DIAGNOSIS — R739 Hyperglycemia, unspecified: Secondary | ICD-10-CM

## 2022-05-14 NOTE — Progress Notes (Signed)
? ?Subjective:  ?Patient ID: Terri Wiley, female    DOB: 17-Jan-1976  Age: 47 y.o. MRN: [id number] ? ?CC:  ?Chief Complaint  ?Patient presents with  ? Medication Management  ?  Follow up on labs   ? ? ?HPI ?Terri Wiley presents for  ? ?Hypokalemia ?Low 3.3 on April 20, improved on recheck labs April 28. ? ?Hyperglycemia ?Borderline glucose 100 on April 28 labs normal A1c in March. ?Lab Results  ?Component Value Date  ? HGBA1C 5.4 02/27/2022  ? ?Hypoalbuminemia ?Albumin 3.4 on April 28 labs.  Previously 3.6 ?Eating well, not skipping meals, eating protein.  ? 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 ?Recommended discussing with dentists as possible component of teeth grinding.  NSAIDs were deferred given history of reflux and need for higher dose of PPI last visit. ?Still some left face pain - less pressure, less soreness - comes and goes. still occasional soreness at times. Has not met with dentist - next month regularly scheduled appt.  ? ?Swelling above collarbone on left to lower left neck past few days. No fever, rash. No difficulty swallowing. No breast pain or rash.  ?Overdue for mammogram. Few years ago at [facility].  ?Record review: ?2016 at [facility]: ? ?IMPRESSION: Stable appearance to the previously identified 1.0 cm  ?probable complex cyst or benign solid lesion in the medial left breast.  ?There is an adjacent 0.9 cm similar appearing probable complex cyst or  ?benign solid lesion which is stable in size but is more conspicuous on  ?today's exam mammographically. There is a new 1.5 cm cyst in the upper  ?outer left breast.  ? ?RECOMMENDATION: Followup left breast ultrasound in 6 months. The patient  ?was informed of these results and recommendations at the time of the  ?visit.  ? ? ?Abdominal/chest pain, hiatal hernia ?Protonix dosage increased at her  April 20 visit and referred to gastroenterology.  Option to meet with general surgery regarding hiatal hernia but plan for GI eval first. ?Higher dose protonix helpful - no call from GI yet.  ? ? ?History ?Patient Active Problem List  ? Diagnosis Date Noted  ? LLQ pain 07/11/2021  ? Hot flashes 07/11/2021  ? Papanicolaou smear of cervix with positive high risk human papilloma virus (HPV) test 06/24/2021  ? Trichimoniasis 06/24/2021  ? Menorrhagia with irregular cycle 06/19/2021  ? Fibroids 06/19/2021  ? Perimenopause 06/19/2021  ? Routine Papanicolaou smear 06/19/2021  ? Chronic left hip pain 02/28/2021  ? Mass of left hip region 02/28/2021  ? ?Past Medical History:  ?Diagnosis Date  ? GERD (gastroesophageal reflux disease)   ? Hernia cerebri ([place])   ? Papanicolaou smear of cervix with positive high risk human papilloma virus (HPV) test 06/24/2021  ? 06/24/21 repeat pap in 1 year per ASCCP guidelines   ? Trichimoniasis 06/24/2021  ? Treated 06/24/21, POC___________  ? ?Past Surgical History:  ?Procedure Laterality Date  ? HERNIA REPAIR    ? tubiligation    ? ?Allergies  ?Allergen Reactions  ? Bee Venom Shortness Of Breath  ? Penicillins Hives  ? ?Prior to Admission medications   ?Medication Sig Start Date End Date Taking? Authorizing Provider  ?acetaminophen (TYLENOL) 325 MG tablet Take 650 mg by mouth every 6 (six) hours as needed. pain   Yes [provider]  ?aspirin 81 MG EC tablet Take 81 mg by mouth daily. Swallow whole.  Yes [provider]  ?cetirizine (ZYRTEC) 10 MG tablet Take 1 tablet (10 mg total) by mouth daily. 04/13/22  Yes Wendie Agreste, MD  ?furosemide (LASIX) 40 MG tablet Take 40 mg by mouth daily as needed for fluid or edema. 04/30/21  Yes [provider]  ?LORazepam (ATIVAN) 0.5 MG tablet Take 0.5 mg by mouth daily as needed for anxiety. 10/08/20  Yes [provider]  ?pantoprazole (PROTONIX) 40 MG tablet Take 1 tablet (40 mg total) by mouth daily. 04/25/22  Yes  Wendie Agreste, MD  ?FEROSUL 325 (65 Fe) MG tablet Take 325 mg by mouth daily. ?Patient not taking: Reported on 05/14/2022 12/02/20   [provider]  ?meloxicam (MOBIC) 7.5 MG tablet Take 1 tablet (7.5 mg total) by mouth daily. ?Patient not taking: Reported on 04/17/2022 01/23/22   Wendie Agreste, MD  ?Norethindrone-Ethinyl Estradiol-Fe Biphas (LO LOESTRIN FE) 1 MG-10 MCG / 10 MCG tablet Take 1 tablet by mouth daily. Take 1 daily by mouth ?Patient not taking: Reported on 04/17/2022 07/11/21   Estill Dooms, NP  ?Throat Lozenges (RA VITAMIN C COUGH DROPS MT) Use as directed 2 lozenges in the mouth or throat daily. ?Patient not taking: Reported on 05/14/2022    [provider]  ? ?Social History  ? ?Socioeconomic History  ? Marital status: Divorced  ?  Spouse name: Not on file  ? Number of children: Not on file  ? Years of education: Not on file  ? Highest education level: Not on file  ?Occupational History  ? Not on file  ?Tobacco Use  ? Smoking status: Never  ? Smokeless tobacco: Never  ?Vaping Use  ? Vaping Use: Never used  ?Substance and Sexual Activity  ? Alcohol use: No  ? Drug use: No  ? Sexual activity: Yes  ?  Birth control/protection: Surgical  ?  Comment: tubal  ?Other Topics Concern  ? Not on file  ?Social History Narrative  ? Not on file  ? ?Social Determinants of Health  ? ?Financial Resource Strain: Medium Risk  ? Difficulty of Paying Living Expenses: Somewhat hard  ?Food Insecurity: No Food Insecurity  ? Worried About Charity fundraiser in the Last Year: Never true  ? Ran Out of Food in the Last Year: Never true  ?Transportation Needs: No Transportation Needs  ? Lack of Transportation (Medical): No  ? Lack of Transportation (Non-Medical): No  ?Physical Activity: Insufficiently Active  ? Days of Exercise per Week: 2 days  ? Minutes of Exercise per Session: 30 min  ?Stress: Stress Concern Present  ? Feeling of Stress : To some extent  ?Social Connections: Moderately Isolated  ?  Frequency of Communication with Friends and Family: Three times a week  ? Frequency of Social Gatherings with Friends and Family: Once a week  ? Attends Religious Services: 1 to 4 times per year  ? Active Member of Clubs or Organizations: No  ? Attends Archivist Meetings: Never  ? Marital Status: Divorced  ?Intimate Partner Violence: Not At Risk  ? Fear of Current or Ex-Partner: No  ? Emotionally Abused: No  ? Physically Abused: No  ? Sexually Abused: No  ? ? ?Review of Systems ?Per HPI.  ? ?Objective:  ? ?Vitals:  ? 05/14/22 1541  ?BP: 126/72  ?Pulse: 68  ?Resp: 18  ?Temp: 98.2 ?F (36.8 ?C)  ?TempSrc: Temporal  ?SpO2: 98%  ?Weight: 269 lb 9.6 oz (122.3 kg)  ?Height: 5' 9.5" (1.765  m)  ? ? ? ?Physical Exam ?Vitals reviewed.  ?Constitutional:   ?   Appearance: Normal appearance. She is well-developed.  ?HENT:  ?   Head: Normocephalic and atraumatic.  ?Eyes:  ?   Conjunctiva/sclera: Conjunctivae normal.  ?   Pupils: Pupils are equal, round, and reactive to light.  ?Neck:  ?   Vascular: No carotid bruit.  ?Cardiovascular:  ?   Rate and Rhythm: Normal rate and regular rhythm.  ?   Heart sounds: Normal heart sounds.  ?Pulmonary:  ?   Effort: Pulmonary effort is normal.  ?   Breath sounds: Normal breath sounds.  ?Abdominal:  ?   Palpations: Abdomen is soft. There is no pulsatile mass.  ?   Tenderness: There is no abdominal tenderness.  ?Musculoskeletal:  ?   Right lower leg: No edema.  ?   Left lower leg: No edema.  ?Skin: ?   General: Skin is warm and dry.  ?Neurological:  ?   Mental Status: She is alert and oriented to person, place, and time.  ?Psychiatric:     ?   Mood and Affect: Mood normal.     ?   Behavior: Behavior normal.  ?Soft tissue prominence in the left supraclavicular region.  Mild discomfort.  Firm/nodular areas appreciated.  Minimal discomfort into the lower left lateral neck, but prior left-sided face/TMJ discomfort resolved, nontender on exam.  Pain-free jaw opening/closing.  ?No stridor.   ? ? ? ? ?Assessment & Plan:  ?Terri Wiley is a 46 y.o. female . ?Hiatal hernia ? -Improved control of reflux symptoms on twice daily PPI, follow-up planned with gastroenterology. ? ?Left-sided face pain ?

## 2022-05-14 NOTE — Patient Instructions (Addendum)
Continue same dose protonix, you should form GI soon.  ?If you are still having soreness on left side, may want to meet with dentist about teeth grinding.   ?Return to the clinic or go to the nearest emergency room if any of your symptoms worsen or new symptoms occur. ? ?Lab only visit in 2 weeks. Will recheck potassium and 3 month blood sugar. ? ?I will refer you for updated breast ultrasound and mammogram. If any worsening swelling above collarbone, fevers, chills, or new symptoms be seen in the ER.  ? ? ? ? ? ? ?

## 2022-05-15 ENCOUNTER — Other Ambulatory Visit (HOSPITAL_COMMUNITY): Payer: Self-pay | Admitting: Family Medicine

## 2022-05-15 DIAGNOSIS — N6002 Solitary cyst of left breast: Secondary | ICD-10-CM

## 2022-05-15 NOTE — Addendum Note (Signed)
Addended by: Merri Ray R on: 05/15/2022 08:31 AM   Modules accepted: Orders

## 2022-05-19 ENCOUNTER — Inpatient Hospital Stay
Admission: RE | Admit: 2022-05-19 | Discharge: 2022-05-19 | Disposition: A | Discharge status: Home / Self Care | Payer: Self-pay | Source: Ambulatory Visit | Attending: Family Medicine | Admitting: Family Medicine

## 2022-05-19 ENCOUNTER — Ambulatory Visit
Admission: RE | Admit: 2022-05-19 | Discharge: 2022-05-19 | Disposition: A | Discharge status: Home / Self Care | Payer: Self-pay | Source: Ambulatory Visit | Attending: Family Medicine | Admitting: Family Medicine

## 2022-05-19 ENCOUNTER — Other Ambulatory Visit: Payer: Self-pay | Admitting: Family Medicine

## 2022-05-19 DIAGNOSIS — N6002 Solitary cyst of left breast: Secondary | ICD-10-CM

## 2022-05-19 DIAGNOSIS — R222 Localized swelling, mass and lump, trunk: Secondary | ICD-10-CM

## 2022-05-21 ENCOUNTER — Ambulatory Visit (HOSPITAL_BASED_OUTPATIENT_CLINIC_OR_DEPARTMENT_OTHER): Payer: 59 | Admitting: Orthopaedic Surgery

## 2022-05-28 ENCOUNTER — Other Ambulatory Visit: Payer: 59

## 2022-05-28 ENCOUNTER — Other Ambulatory Visit (INDEPENDENT_AMBULATORY_CARE_PROVIDER_SITE_OTHER): Payer: 59

## 2022-05-28 DIAGNOSIS — E8809 Other disorders of plasma-protein metabolism, not elsewhere classified: Secondary | ICD-10-CM | POA: Diagnosis not present

## 2022-05-28 DIAGNOSIS — R739 Hyperglycemia, unspecified: Secondary | ICD-10-CM

## 2022-05-28 LAB — COMPREHENSIVE METABOLIC PANEL
ALT: 10 U/L (ref 0–35)
AST: 10 U/L (ref 0–37)
Albumin: 3.3 g/dL — ABNORMAL LOW (ref 3.5–5.2)
Alkaline Phosphatase: 72 U/L (ref 39–117)
BUN: 13 mg/dL (ref 6–23)
CO2: 25 mEq/L (ref 19–32)
Calcium: 8.8 mg/dL (ref 8.4–10.5)
Chloride: 109 mEq/L (ref 96–112)
Creatinine, Ser: 0.72 mg/dL (ref 0.40–1.20)
GFR: 99.19 mL/min (ref >60.00)
Glucose, Bld: 124 mg/dL — ABNORMAL HIGH (ref 70–99)
Potassium: 3.5 mEq/L (ref 3.5–5.1)
Sodium: 142 mEq/L (ref 135–145)
Total Bilirubin: 0.4 mg/dL (ref 0.2–1.2)
Total Protein: 6.5 g/dL (ref 6.0–8.3)

## 2022-05-28 LAB — HEMOGLOBIN A1C: Hgb A1c MFr Bld: 5.2 % (ref 4.6–6.5)

## 2022-06-02 ENCOUNTER — Ambulatory Visit: Payer: 59 | Admitting: Family Medicine

## 2022-06-03 ENCOUNTER — Other Ambulatory Visit (HOSPITAL_COMMUNITY): Payer: 59

## 2022-06-03 ENCOUNTER — Ambulatory Visit (HOSPITAL_COMMUNITY): Payer: 59

## 2022-06-03 ENCOUNTER — Encounter (HOSPITAL_COMMUNITY): Payer: 59

## 2022-06-04 ENCOUNTER — Encounter: Payer: Self-pay | Admitting: Gastroenterology

## 2022-06-11 ENCOUNTER — Ambulatory Visit (HOSPITAL_BASED_OUTPATIENT_CLINIC_OR_DEPARTMENT_OTHER): Payer: 59 | Admitting: Physical Therapy

## 2022-06-11 NOTE — Therapy (Incomplete)
PCP: Wendie Agreste, MD   REFERRING PROVIDER: Vanetta Mulders, MD   REFERRING DIAG: [phone] (ICD-10-CM) - Chronic left hip pain   Rationale for Evaluation and Treatment Rehabilitation   THERAPY DIAG:  Other low back pain - Plan: PT plan of care cert/re-cert   Pain in left hip   Stiffness of left hip, not elsewhere classified   Muscle weakness (generalized) - Plan: PT plan of care cert/re-cert   ONSET DATE:    SUBJECTIVE:                                                                                                                                                                                            SUBJECTIVE STATEMENT:   PERTINENT HISTORY:   PAIN:  Are you having pain? Yes: NPRS scale: ***/10 Pain location:  Pain description:  Aggravating factors:  Relieving factors:     PRECAUTIONS: None   WEIGHT BEARING RESTRICTIONS No   FALLS:  Has patient fallen in last 6 months? ***   LIVING ENVIRONMENT: Lives with:  Lives in: House/apartment Stairs: stairs to enter  Has following equipment at home: None   OCCUPATION:   PLOF: Independent   PATIENT GOALS :     OBJECTIVE:    DIAGNOSTIC FINDINGS:       PATIENT SURVEYS:  FOTO ***  *** @ DC *** pts MCII   SCREENING FOR RED FLAGS: Bowel or bladder incontinence: No Spinal tumors: No Cauda equina syndrome: No Compression fracture: No     COGNITION:           Overall cognitive status: Within functional limits for tasks assessed                          SENSATION: WFL   POSTURE: ***   PALPATION:   LOWER EXTREMITY ROM:      Active  Right eval Left eval  Hip flexion WFL 110 p!  Hip extension WFL 0  Hip abduction [facility]  Hip adduction [facility]  Hip internal rotation WFL 15 p!  Hip external rotation [facility] 30   Knee flexion [facility] WFL  Knee extension WFL WFL   (Blank rows = not tested)   LOWER EXTREMITY MMT:     MMT Right eval Left eval  Hip flexion 4+/5 4/5  Hip extension       Hip abduction 4+/5 4/5  Hip adduction 4+/5 4/5  Hip internal rotation 4+/5 4/5  Hip external rotation 4+/5 4/5   (Blank rows = not tested)   LUMBAR SPECIAL TESTS:  Straight leg raise test: , SI Compression/distraction test: ,  and FABER : ; HIP SCOUR ; log roll    FUNCTIONAL TESTS:  Step up/down:    GAIT: Distance walked: 46f Assistive device utilized: None Level of assistance: Complete Independence Comments:        TODAY'S TREATMENT          PATIENT EDUCATION:  Education details: MOI, diagnosis, prognosis, anatomy, exercise progression, DOMS expectations, muscle firing,  envelope of function, HEP, POC   Person educated: Patient Education method: Explanation, Demonstration, Tactile cues, Verbal cues, and Handouts Education comprehension: verbalized understanding, returned demonstration, verbal cues required, and tactile cues required     HOME EXERCISE PROGRAM:   ASSESSMENT:   CLINICAL IMPRESSION: Patient is a *** y.o. female who was seen today for physical therapy evaluation and treatment for cc of L sided hip pain. Pt's s/s appear consistent with potential ***.   Pt would benefit from continued skilled therapy in order to reach goals and maximize functional lumbopelvic strength and ROM for return to PLOF and improving QOL.      OBJECTIVE IMPAIRMENTS Abnormal gait, decreased activity tolerance, decreased balance, decreased mobility, difficulty walking, decreased ROM, decreased strength, hypomobility, increased muscle spasms, impaired flexibility, impaired sensation, improper body mechanics, postural dysfunction, and pain.    ACTIVITY LIMITATIONS carrying, lifting, bending, sitting, standing, squatting, sleeping, stairs, transfers, bed mobility, locomotion level, and exercise/recreation   PARTICIPATION LIMITATIONS: cleaning, laundry, interpersonal relationship, driving, shopping, and yard work   PERSONAL FACTORS Age, Fitness, Past/current experiences, Time since onset  of injury/illness/exacerbation, and 1-2 comorbidities:    are also affecting patient's functional outcome.    REHAB POTENTIAL: Good   CLINICAL DECISION MAKING: Stable/uncomplicated   EVALUATION COMPLEXITY: Low     GOALS:     SHORT TERM GOALS: Target date: 07/23/2022    Pt will become independent with HEP in order to demonstrate synthesis of PT education.   Goal status: INITIAL   2.  Pt will report at least 2 pt reduction on NPRS scale for pain in order to demonstrate functional improvement with household activity, self care, and ADL.    Goal status: INITIAL   3.  Pt will score at least *** pt increase on FOTO to demonstrate functional improvement in MCII and pt perceived function.     Goal status: INITIAL   LONG TERM GOALS: Target date: 09/03/2022    Pt  will become independent with final HEP in order to demonstrate synthesis of PT education.    Goal status: INITIAL   2.  Pt will score >/= *** on FOTO to demonstrate improvement in perceived L hip function.    Goal status: INITIAL   3.  Pt will be able to demonstrate ability to perform deep squatting without pain in order to demonstrate functional improvement in LE function for self-care and house hold duties.     Goal status: INITIAL   4.  Pt will be able to demonstrate/report ability to walk >30 mins without pain in order to demonstrate functional improvement and tolerance to exercise and community mobility.    Goal status: INITIAL PLAN: PT FREQUENCY: 1-2x/week   PT DURATION: 12 weeks (likely D/C by 8 wks)   PLANNED INTERVENTIONS: Therapeutic exercises, Therapeutic activity, Neuromuscular re-education, Balance training, Gait training, Patient/Family education, Joint manipulation, Joint mobilization, Stair training, Vestibular training, Orthotic/Fit training, DME instructions, Aquatic Therapy, Dry Needling, Electrical stimulation, Spinal manipulation, Spinal mobilization, Cryotherapy, Moist heat, Splintting, Taping,  Vasopneumatic device, Traction, Ultrasound, Ionotophoresis 457mml Dexamethasone, Manual therapy, and Re-evaluation.   PLAN FOR NEXT SESSION: review  HEP, manual STM, joint mobilizations with Mulligan belt    Daleen Bo PT, DPT 06/11/22 10:02 AM

## 2022-06-17 ENCOUNTER — Encounter (HOSPITAL_COMMUNITY): Payer: 59

## 2022-06-17 ENCOUNTER — Ambulatory Visit (HOSPITAL_COMMUNITY): Payer: 59

## 2022-06-17 ENCOUNTER — Other Ambulatory Visit (HOSPITAL_COMMUNITY): Payer: 59

## 2022-06-19 ENCOUNTER — Ambulatory Visit (HOSPITAL_COMMUNITY)
Admission: RE | Admit: 2022-06-19 | Discharge: 2022-06-19 | Disposition: A | Discharge status: Home / Self Care | Payer: 59 | Source: Ambulatory Visit | Attending: Family Medicine | Admitting: Family Medicine

## 2022-06-19 ENCOUNTER — Other Ambulatory Visit (HOSPITAL_COMMUNITY): Payer: Self-pay | Admitting: Family Medicine

## 2022-06-19 DIAGNOSIS — N6002 Solitary cyst of left breast: Secondary | ICD-10-CM | POA: Diagnosis present

## 2022-06-19 DIAGNOSIS — R222 Localized swelling, mass and lump, trunk: Secondary | ICD-10-CM | POA: Diagnosis present

## 2022-06-19 DIAGNOSIS — R928 Other abnormal and inconclusive findings on diagnostic imaging of breast: Secondary | ICD-10-CM

## 2022-06-25 ENCOUNTER — Telehealth: Payer: Self-pay | Admitting: *Deleted

## 2022-06-25 NOTE — Progress Notes (Signed)
Cardiology Office Note    Date:  06/30/2022   ID:  Terri Wiley, DOB 1976-02-20, MRN [id number]   PCP:  Wendie Agreste, MD   [place]  Cardiologist:  Jenkins Rouge, MD    History of Present Illness:  Terri Wiley is a 46 y.o. female with history of atypical chest pain seen by initially on 02/2021 2D echo normal LVEF 55 to 60% Lexiscan Myoview low risk study EF 58%. Seen again by PA 11/2021 had atypical pain after her 40 yo son died in 10-09-2021   Seen in ED 04/17/22 for atypical pain again  and r/o  ? Related to anxiety with more reflux having run out of protonix and GI cocktail improved   Works as a Educational psychologist at [employer] and walks a lot. Walks on a track twice a week-4-5 laps. Sometimes stops because she gets tired, not because of chest pain. . Sleeps propped up because she gets short of breath otherwise. Drinks a lot of coffee, sweet tea, gatorade, and V8 juices. Eats a lot of canned foods.   For past 3 months has had pain behind left knee with some swelling that her lasix helps with Aching pain not claudication    Past Medical History:  Diagnosis Date   GERD (gastroesophageal reflux disease)    Hernia cerebri (HCC)    Papanicolaou smear of cervix with positive high risk human papilloma virus (HPV) test 06/24/2021   06/24/21 repeat pap in 1 year per ASCCP guidelines    Trichimoniasis 06/24/2021   Treated 06/24/21, POC___________    Past Surgical History:  Procedure Laterality Date   HERNIA REPAIR     tubiligation      Current Medications: Current Meds  Medication Sig   acetaminophen (TYLENOL) 325 MG tablet Take 650 mg by mouth every 6 (six) hours as needed. pain   aspirin 81 MG EC tablet Take 81 mg by mouth daily. Swallow whole.   cetirizine (ZYRTEC) 10 MG tablet Take 1 tablet (10 mg total) by mouth daily.   FEROSUL 325 (65 Fe) MG tablet Take 325 mg by mouth daily.   furosemide (LASIX) 40 MG tablet Take 40 mg by mouth daily as needed  for fluid or edema.   LORazepam (ATIVAN) 0.5 MG tablet Take 0.5 mg by mouth daily as needed for anxiety.   meloxicam (MOBIC) 7.5 MG tablet Take 1 tablet (7.5 mg total) by mouth daily.   Norethindrone-Ethinyl Estradiol-Fe Biphas (LO LOESTRIN FE) 1 MG-10 MCG / 10 MCG tablet Take 1 tablet by mouth daily. Take 1 daily by mouth   pantoprazole (PROTONIX) 40 MG tablet Take 1 tablet (40 mg total) by mouth daily.   Throat Lozenges (RA VITAMIN C COUGH DROPS MT) Use as directed 2 lozenges in the mouth or throat daily.     Allergies:   Bee venom and Penicillins   Social History   Socioeconomic History   Marital status: Divorced    Spouse name: Not on file   Number of children: Not on file   Years of education: Not on file   Highest education level: Not on file  Occupational History   Not on file  Tobacco Use   Smoking status: Never   Smokeless tobacco: Never  Vaping Use   Vaping Use: Never used  Substance and Sexual Activity   Alcohol use: No   Drug use: No   Sexual activity: Yes    Birth control/protection: Surgical    Comment:  tubal  Other Topics Concern   Not on file  Social History Narrative   Not on file   Social Determinants of Health   Financial Resource Strain: Medium Risk (06/19/2021)   Overall Financial Resource Strain (CARDIA)    Difficulty of Paying Living Expenses: Somewhat hard  Food Insecurity: No Food Insecurity (06/19/2021)   Hunger Vital Sign    Worried About Running Out of Food in the Last Year: Never true    Ran Out of Food in the Last Year: Never true  Transportation Needs: No Transportation Needs (06/19/2021)   PRAPARE - Hydrologist (Medical): No    Lack of Transportation (Non-Medical): No  Physical Activity: Insufficiently Active (06/19/2021)   Exercise Vital Sign    Days of Exercise per Week: 2 days    Minutes of Exercise per Session: 30 min  Stress: Stress Concern Present (06/19/2021)   [place]    Feeling of Stress : To some extent  Social Connections: Moderately Isolated (06/19/2021)   Social Connection and Isolation Panel [NHANES]    Frequency of Communication with Friends and Family: Three times a week    Frequency of Social Gatherings with Friends and Family: Once a week    Attends Religious Services: 1 to 4 times per year    Active Member of [employer] or Organizations: No    Attends Music therapist: Never    Marital Status: Divorced     Family History:  The patient's  family history includes Aneurysm in her father; Diabetes in her mother; Hypertension in her father and mother; Liver disease in her mother.   ROS:   Please see the history of present illness.    ROS All other systems reviewed and are negative.   PHYSICAL EXAM:   VS:  BP 130/82   Pulse (!) 56   Ht 5' 11.4" (1.814 m)   Wt 274 lb 6.4 oz (124.5 kg)   SpO2 96%   BMI 37.84 kg/m   Affect appropriate Healthy:  appears stated age 31: normal Neck supple with no adenopathy JVP normal no bruits no thyromegaly Lungs clear with no wheezing and good diaphragmatic motion Heart:  S1/S2 no murmur, no rub, gallop or click PMI normal Abdomen: benighn, BS positve, no tenderness, no AAA no bruit.  No HSM or HJR Distal pulses intact with no bruits No edema Neuro non-focal Skin warm and dry No muscular weakness   Wt Readings from Last 3 Encounters:  06/30/22 274 lb 6.4 oz (124.5 kg)  05/14/22 269 lb 9.6 oz (122.3 kg)  04/25/22 270 lb (122.5 kg)      Studies/Labs Reviewed:   EKG:   04/18/22 SR low voltage non specific ST changes  Recent Labs: 04/17/2022: Hemoglobin 12.2; Platelets 256 05/28/2022: ALT 10; BUN 13; Creatinine, Ser 0.72; Potassium 3.5; Sodium 142   Lipid Panel    Component Value Date/Time   CHOL 151 02/27/2022 1025   TRIG 46.0 02/27/2022 1025   HDL 48.10 02/27/2022 1025   CHOLHDL 3 02/27/2022 1025   VLDL 9.2 02/27/2022 1025   LDLCALC 94  02/27/2022 1025    Additional studies/ records that were reviewed today include:  2D echo 3/18/2022IMPRESSIONS     1. Left ventricular ejection fraction, by estimation, is 55 to 60%. The  left ventricle has normal function. The left ventricle has no regional  wall motion abnormalities. Left ventricular diastolic parameters are  indeterminate.   2.  Right ventricular systolic function is normal. The right ventricular  size is normal. There is normal pulmonary artery systolic pressure. The  estimated right ventricular systolic pressure is 76.7 mmHg.   3. A small pericardial effusion is present. The pericardial effusion is  posterior to the left ventricle.   4. The mitral valve is grossly normal. Trivial mitral valve  regurgitation.   5. The aortic valve is tricuspid. Aortic valve regurgitation is not  visualized.   6. The inferior vena cava is normal in size with greater than 50%  respiratory variability, suggesting right atrial pressure of 3 mmHg.    Lexiscan Myoview 03/15/2021 Treadmill attempted but patient unable to achieve adequate heart rate response and there was substantial lead motion artifact making interpretation impossible. Study therefore switched to Lexiscan infusion. No diagnostic ST segment changes were noted. There were occasional to frequent PVCs noted in recovery including couplets. These are upright in the inferior leads suggesting possible outflow tract origin. No sustained arrhythmias. No significant myocardial perfusion defects to indicate scar or ischemia. This is a low risk study. Nuclear stress EF: 58%.      PLAN:  In order of problems listed above:  Atypical chest pain with low risk Lexiscan Myoview 02/2021 normal LVEF on echo. Protonix filled last visit  ? GERD/Reflux causing symptoms improved back on protonix to see GI soon  HTN- driven by stress and high salt diet improved continue diuretic  Suspected sleep apnea-order sleep study  Neck pain-  muscular NSAI's as needed   Obestiy-mediterranean diet and exercise discussed  Leg Pain:  left behind knee good pulses check LLE venous duplex r/o bakers cyst   LLE venous duplex  F/U in a year     Signed, Jenkins Rouge, MD  06/30/2022 10:27 AM    [address], [place], [place]  [address] Phone: [phone]; Fax: [phone]

## 2022-06-25 NOTE — Telephone Encounter (Signed)
-----   Message from Michaelyn Barter, RN sent at 06/25/2022  4:03 PM EDT -----  ----- Message ----- From: Josue Hector, MD Sent: 06/25/2022   3:11 PM EDT To: Imogene Burn, PA-C; Michaelyn Barter, RN  Did her sleep study ever get ordered/done

## 2022-06-25 NOTE — Telephone Encounter (Signed)
Per sleep lab, Patient (Pt canceled and will call back to reschedule at a later date. KB) she will need to call sleep lab and rescheduled.

## 2022-06-28 HISTORY — PX: BREAST BIOPSY: SHX20

## 2022-06-30 ENCOUNTER — Encounter: Payer: Self-pay | Admitting: Cardiovascular Disease

## 2022-06-30 ENCOUNTER — Ambulatory Visit: Payer: 59 | Admitting: Cardiovascular Disease

## 2022-06-30 VITALS — BP 130/82 | HR 56 | Ht 71.4 in | Wt 274.4 lb

## 2022-06-30 DIAGNOSIS — R079 Chest pain, unspecified: Secondary | ICD-10-CM | POA: Diagnosis not present

## 2022-06-30 DIAGNOSIS — M79605 Pain in left leg: Secondary | ICD-10-CM | POA: Diagnosis not present

## 2022-06-30 DIAGNOSIS — I10 Essential (primary) hypertension: Secondary | ICD-10-CM

## 2022-06-30 NOTE — Patient Instructions (Signed)
Medication Instructions:  Your physician recommends that you continue on your current medications as directed. Please refer to the Current Medication list given to you today.  *If you need a refill on your cardiac medications before your next appointment, please call your pharmacy*   Lab Work: NONE   If you have labs (blood work) drawn today and your tests are completely normal, you will receive your results only by: [facility] (if you have MyChart) OR A paper copy in the mail If you have any lab test that is abnormal or we need to change your treatment, we will call you to review the results.   Testing/Procedures: Your physician has requested that you have a lower or upper extremity venous duplex. This test is an ultrasound of the veins in the legs or arms. It looks at venous blood flow that carries blood from the heart to the legs or arms. Allow one hour for a Lower Venous exam. Allow thirty minutes for an Upper Venous exam. There are no restrictions or special instructions.     Follow-Up: At [facility], you and your health needs are our priority.  As part of our continuing mission to provide you with exceptional heart care, we have created designated Provider Care Teams.  These Care Teams include your primary Cardiologist (physician) and Advanced Practice Providers (APPs -  Physician Assistants and Nurse Practitioners) who all work together to provide you with the care you need, when you need it.  We recommend signing up for the patient portal called "MyChart".  Sign up information is provided on this After Visit Summary.  MyChart is used to connect with patients for Virtual Visits (Telemedicine).  Patients are able to view lab/test results, encounter notes, upcoming appointments, etc.  Non-urgent messages can be sent to your provider as well.   To learn more about what you can do with MyChart, go to [email or web address].    Your next appointment:   1 year(s)  The format  for your next appointment:   In Person  Provider:   Jenkins Rouge, MD    Other Instructions Thank you for choosing [facility]!    Important Information About Sugar

## 2022-07-02 ENCOUNTER — Ambulatory Visit (INDEPENDENT_AMBULATORY_CARE_PROVIDER_SITE_OTHER): Payer: 59 | Admitting: Gastroenterology

## 2022-07-02 ENCOUNTER — Encounter: Payer: Self-pay | Admitting: Gastroenterology

## 2022-07-02 VITALS — BP 106/70 | HR 72 | Ht 69.5 in | Wt 277.0 lb

## 2022-07-02 DIAGNOSIS — K449 Diaphragmatic hernia without obstruction or gangrene: Secondary | ICD-10-CM | POA: Diagnosis not present

## 2022-07-02 DIAGNOSIS — R0789 Other chest pain: Secondary | ICD-10-CM

## 2022-07-02 DIAGNOSIS — R1110 Vomiting, unspecified: Secondary | ICD-10-CM

## 2022-07-02 DIAGNOSIS — K5909 Other constipation: Secondary | ICD-10-CM

## 2022-07-02 DIAGNOSIS — K8020 Calculus of gallbladder without cholecystitis without obstruction: Secondary | ICD-10-CM

## 2022-07-02 DIAGNOSIS — K828 Other specified diseases of gallbladder: Secondary | ICD-10-CM

## 2022-07-02 DIAGNOSIS — K219 Gastro-esophageal reflux disease without esophagitis: Secondary | ICD-10-CM | POA: Diagnosis not present

## 2022-07-02 DIAGNOSIS — Z1211 Encounter for screening for malignant neoplasm of colon: Secondary | ICD-10-CM

## 2022-07-02 MED ORDER — PANTOPRAZOLE SODIUM 40 MG PO TBEC: 40.0000 mg | DELAYED_RELEASE_TABLET | Freq: Two times a day (BID) | ORAL | 3 refills | Status: DC

## 2022-07-02 NOTE — Patient Instructions (Addendum)
GERD Lifestyle modifications -Eat meals >2-3 hours before bedtime -Do not lay down or lie flat immediately after eating for at least 2-hours -Do not overeat (decrease portion size and/or eat more frequent smaller meals) -Eat slowly -Wear Loose-fitting clothes -Raise Head of Bed (Head & Chest > Feet with bed blocks or bed wedge) -Avoid foods that trigger the symptoms (Onions, Chocolate, Caffeine, Spicy, and Fatty) -Work on Losing Weight -Avoid Tobacco Use -Avoid Alcohol -Increase Exercise Activity -Maintain Heartburn Diary/Log and bring to next appointment (chest pain/regurgitation sensation/reflux)  We have increased your pantoprazole to 40 mg twice daily. A new prescription has been sent to your pharmacy.   You have been scheduled for an endoscopy. Please follow written instructions given to you at your visit today. If you use inhalers (even only as needed), please bring them with you on the day of your procedure.  The [facility] providers would like to encourage you to use [facility] to communicate with providers for non-urgent requests or questions.  Due to long hold times on the telephone, sending your provider a message by [facility] may be a faster and more efficient way to get a response.  Please allow 48 business hours for a response.  Please remember that this is for non-urgent requests.   Due to recent changes in healthcare laws, you may see the results of your imaging and laboratory studies on MyChart before your provider has had a chance to review them.  We understand that in some cases there may be results that are confusing or concerning to you. Not all laboratory results come back in the same time frame and the provider may be waiting for multiple results in order to interpret others.  Please give [place] 48 hours in order for your provider to thoroughly review all the results before contacting the office for clarification of your results.

## 2022-07-02 NOTE — Progress Notes (Addendum)
[place] VISIT   Primary Care Provider Wendie Agreste, MD [address] [place] [address] [phone]  Referring Provider Wendie Agreste, MD [address] [place],  [place] [address] [phone]  Patient Profile: Terri Wiley is a 46 y.o. female with a pmh significant for arthritis, fibromyalgia, hypertension, nephrolithiasis, GERD, hiatal hernia, chronic constipation.  The patient presents to the [facility] for an evaluation and management of problem(s) noted below:  Problem List 1. Gastroesophageal reflux disease, unspecified whether esophagitis present   2. Hiatal hernia   3. Regurgitation of food   4. Atypical chest pain   5. Chronic constipation   6. Colon cancer screening   7. Calculus of gallbladder without cholecystitis without obstruction   8. Dysfunctional gallbladder     History of Present Illness This is the patient's first visit to the outpatient [facility].  She has not previously been seen by gastroenterologist.  The patient is referred by her primary care provider as a result of issues from a chest discomfort perspective and findings on imaging of a hiatal hernia.  The patient has a longstanding history acid reflux and heartburn.  She will have periods of belching bloating as well.  She had nausea and vomiting in the past.  Her weight fluctuates.  She has had symptoms of reflux and regurgitation.  Due to significant chest discomfort she ended up going to the hospital recently for further evaluation.  The discomfort was throughout the upper portion of her chest.  It was felt that a cardiac etiology was unlikely.  Her daily Protonix 20 mg was titrated up to 40 mg.  Since increasing the Protonix the patient feels 20 to 30% better in regards to her symptoms.  She has a longer standing constipation issues and can go up to 5 days without having a bowel movement.  She denies any blood in her stools.  She  underwent Cologuard testing earlier this year and that was negative.  Patient does take NSAIDs but on an infrequent basis.  She has multiple appointments and work-ups being done over the course of the next few weeks.  It has been felt that she does not have a cardiac etiology for her chest pain.  She has never had an upper or lower endoscopy.  GI Review of Systems Positive as above Negative for dysphagia, odynophagia, melena, hematochezia  Review of Systems General: Denies fevers/chills/weight loss unintentionally Cardiovascular: Denies current chest pain/palpitations Pulmonary: Denies shortness of breath Gastroenterological: See HPI Genitourinary: Denies darkened urine or hematuria Hematological: Denies easy bruising/bleeding Endocrine: Denies temperature intolerance Dermatological: Denies jaundice Psychological: Mood is stable   Medications Current Outpatient Medications  Medication Sig Dispense Refill   acetaminophen (TYLENOL) 325 MG tablet Take 650 mg by mouth every 6 (six) hours as needed. pain     aspirin 81 MG EC tablet Take 81 mg by mouth daily. Swallow whole.     cetirizine (ZYRTEC) 10 MG tablet Take 1 tablet (10 mg total) by mouth daily. 30 tablet 0   FEROSUL 325 (65 Fe) MG tablet Take 325 mg by mouth daily.     furosemide (LASIX) 40 MG tablet Take 40 mg by mouth daily as needed for fluid or edema.     LORazepam (ATIVAN) 0.5 MG tablet Take 0.5 mg by mouth daily as needed for anxiety.     meloxicam (MOBIC) 7.5 MG tablet Take 1 tablet (7.5 mg total) by mouth daily. 30 tablet 0  Norethindrone-Ethinyl Estradiol-Fe Biphas (LO LOESTRIN FE) 1 MG-10 MCG / 10 MCG tablet Take 1 tablet by mouth daily. Take 1 daily by mouth 84 tablet 0   Throat Lozenges (RA VITAMIN C COUGH DROPS MT) Use as directed 2 lozenges in the mouth or throat daily.     pantoprazole (PROTONIX) 40 MG tablet Take 1 tablet (40 mg total) by mouth 2 (two) times daily. 60 tablet 3   No current facility-administered  medications for this visit.    Allergies Allergies  Allergen Reactions   Bee Venom Shortness Of Breath   Penicillins Hives    Histories Past Medical History:  Diagnosis Date   GERD (gastroesophageal reflux disease)    Hernia cerebri ([place])    Papanicolaou smear of cervix with positive high risk human papilloma virus (HPV) test 06/24/2021   06/24/21 repeat pap in 1 year per ASCCP guidelines    Trichimoniasis 06/24/2021   Treated 06/24/21, POC___________   Past Surgical History:  Procedure Laterality Date   HERNIA REPAIR     tubiligation     Social History   Socioeconomic History   Marital status: Divorced    Spouse name: Not on file   Number of children: Not on file   Years of education: Not on file   Highest education level: Not on file  Occupational History   Not on file  Tobacco Use   Smoking status: Never   Smokeless tobacco: Never  Vaping Use   Vaping Use: Never used  Substance and Sexual Activity   Alcohol use: No   Drug use: No   Sexual activity: Yes    Birth control/protection: Surgical    Comment: tubal  Other Topics Concern   Not on file  Social History Narrative   Not on file   Social Determinants of Health   Financial Resource Strain: Medium Risk (06/19/2021)   Overall Financial Resource Strain (CARDIA)    Difficulty of Paying Living Expenses: Somewhat hard  Food Insecurity: No Food Insecurity (06/19/2021)   Hunger Vital Sign    Worried About Running Out of Food in the Last Year: Never true    Ran Out of Food in the Last Year: Never true  Transportation Needs: No Transportation Needs (06/19/2021)   PRAPARE - Transportation    Lack of Transportation (Medical): No    Lack of Transportation (Non-Medical): No  Physical Activity: Insufficiently Active (06/19/2021)   Exercise Vital Sign    Days of Exercise per Week: 2 days    Minutes of Exercise per Session: 30 min  Stress: Stress Concern Present (06/19/2021)   [place]    Feeling of Stress : To some extent  Social Connections: Moderately Isolated (06/19/2021)   Social Connection and Isolation Panel [NHANES]    Frequency of Communication with Friends and Family: Three times a week    Frequency of Social Gatherings with Friends and Family: Once a week    Attends Religious Services: 1 to 4 times per year    Active Member of [employer] or Organizations: No    Attends Archivist Meetings: Never    Marital Status: Divorced  Human resources officer Violence: Not At Risk (06/19/2021)   Humiliation, Afraid, Rape, and Kick questionnaire    Fear of Current or Ex-Partner: No    Emotionally Abused: No    Physically Abused: No    Sexually Abused: No   Family History  Problem Relation Age of Onset   Liver disease Mother  Hypertension Mother    Diabetes Mother    Aneurysm Father    Hypertension Father    Colon cancer Neg Hx    Stomach cancer Neg Hx    Esophageal cancer Neg Hx    Inflammatory bowel disease Neg Hx    Pancreatic cancer Neg Hx    Rectal cancer Neg Hx    I have reviewed her medical, social, and family history in detail and updated the electronic medical record as necessary.    PHYSICAL EXAMINATION  BP 106/70   Pulse 72   Ht 5' 9.5" (1.765 m)   Wt 277 lb (125.6 kg)   BMI 40.32 kg/m  Wt Readings from Last 3 Encounters:  07/02/22 277 lb (125.6 kg)  06/30/22 274 lb 6.4 oz (124.5 kg)  05/14/22 269 lb 9.6 oz (122.3 kg)  GEN: NAD, appears stated age, doesn't appear chronically ill PSYCH: Cooperative, without pressured speech EYE: Conjunctivae pink, sclerae anicteric ENT: MMM, without oral ulcers CV: Nontachycardic RESP: No audible wheezing GI: NABS, soft, protuberant abdomen, rounded, NT/ND, without rebound or guarding MSK/EXT: Trace bilateral pedal edema SKIN: No jaundice NEURO:  Alert & Oriented x 3, no focal deficits   REVIEW OF DATA  I reviewed the following data at the time of this encounter:  GI  Procedures and Studies  No relevant studies to review  Laboratory Studies  Reviewed those in epic and care everywhere  Imaging Studies  April 2023 CT abdomen pelvis IMPRESSION: No acute intra-abdominal pathology identified. No definite radiographic explanation for the patient's reported symptoms. Large hiatal hernia, similar to prior examination. Mild left nonobstructing nephrolithiasis. Fibroid uterus.  June 2022 HIDA [facility]CK IMPRESSION: 1. Negative for acute gallbladder disease. 2. Decreased gallbladder ejection fraction of 19%, this can be seen with functional gallbladder disease in the appropriate clinical Setting  May 2022 right upper quadrant ultrasound IMPRESSION: 1. Cholelithiasis. 2. Fatty liver.    ASSESSMENT  Ms. Holleman is a 46 y.o. female with a pmh significant for arthritis, fibromyalgia, hypertension, nephrolithiasis, GERD, hiatal hernia, chronic constipation.  The patient is seen today for evaluation and management of:  1. Gastroesophageal reflux disease, unspecified whether esophagitis present   2. Hiatal hernia   3. Regurgitation of food   4. Atypical chest pain   5. Chronic constipation   6. Colon cancer screening   7. Calculus of gallbladder without cholecystitis without obstruction   8. Dysfunctional gallbladder    The patient is hemodynamically and clinically stable.  She has had improvement in her symptoms by increasing her PPI dosing.  I think she has an opportunity to have an even increased improvement by increasing her medicine even further.  We will increase her to 40 twice daily.  Diagnostic endoscopy to ensure no other etiologies for her symptoms is reasonable and to better define the size of her hiatal hernia though imaging does suggest a moderate size hiatal hernia.  We went over lifestyle modifications as noted below to try and optimize her symptoms and help her have less symptoms.  Not sure if she will end up needing to have surgery for this but if  symptoms persist even on medication titration then we will need to consider the role of a hiatal hernia repair.  The risks and benefits of endoscopic evaluation were discussed with the patient; these include but are not limited to the risk of perforation, infection, bleeding, missed lesions, lack of diagnosis, severe illness requiring hospitalization, as well as anesthesia and sedation related illnesses.  The patient and/or  family is agreeable to proceed.  The current symptoms do not seem to be related to cholelithiasis or the previously noted gallbladder dysfunction noted on CCK HIDA last year.  The patient has had Cologuard testing previously for colon cancer screening this year and she will be due in 3 years for follow-up.  All patient questions were answered to the best of my ability, and the patient agrees to the aforementioned plan of action with follow-up as indicated.   PLAN  Increase Protonix to 40 mg twice daily Diagnostic endoscopy to be scheduled with biopsies and possible dilation if any dysphagia symptoms GERD Lifestyle modifications -Eat meals >3 hours before bedtime -Do not lay down or lie flat immediately after eating for at least 2-hours -Do not overeat (decrease portion size and/or eat more frequent smaller meals) -Eat slowly -Wear Loose-fitting clothes -Raise Head of Bed (Head & Chest > Feet with bed blocks) -Avoid foods that trigger the symptoms (Onions, Chocolate, Caffeine, Spicy, and Fatty) -Work on Losing Weight -Avoid tobacco Use -Avoid Alcohol -Increase Exercise Activity -Maintain Heartburn Diary/Log Colon cancer screening surveillance will be due in 2026 we will place a recall in the system   Orders Placed This Encounter  Procedures   Ambulatory referral to Gastroenterology    New Prescriptions   No medications on file   Modified Medications   Modified Medication Previous Medication   PANTOPRAZOLE (PROTONIX) 40 MG TABLET pantoprazole (PROTONIX) 40 MG tablet       Take 1 tablet (40 mg total) by mouth 2 (two) times daily.    Take 1 tablet (40 mg total) by mouth daily.    Planned Follow Up No follow-ups on file.   Total Time in Face-to-Face and in Coordination of Care for patient including independent/personal interpretation/review of prior testing, medical history, examination, medication adjustment, communicating results with the patient directly, and documentation within the EHR is 45 minutes.   Justice Britain, MD [facility] Office # [phone]

## 2022-07-03 ENCOUNTER — Other Ambulatory Visit (HOSPITAL_COMMUNITY): Payer: Self-pay | Admitting: Family Medicine

## 2022-07-03 ENCOUNTER — Ambulatory Visit (HOSPITAL_COMMUNITY)
Admission: RE | Admit: 2022-07-03 | Discharge: 2022-07-03 | Disposition: A | Discharge status: Home / Self Care | Payer: 59 | Source: Ambulatory Visit | Attending: Family Medicine | Admitting: Family Medicine

## 2022-07-03 DIAGNOSIS — N6321 Unspecified lump in the left breast, upper outer quadrant: Secondary | ICD-10-CM | POA: Diagnosis present

## 2022-07-03 DIAGNOSIS — R928 Other abnormal and inconclusive findings on diagnostic imaging of breast: Secondary | ICD-10-CM | POA: Diagnosis present

## 2022-07-03 MED ORDER — LIDOCAINE HCL (PF) 2 % IJ SOLN
INTRAMUSCULAR | Status: AC
  Filled 2022-07-03: qty 10

## 2022-07-03 MED ORDER — LIDOCAINE-EPINEPHRINE (PF) 1 %-1:200000 IJ SOLN
INTRAMUSCULAR | Status: AC
  Filled 2022-07-03: qty 30

## 2022-07-04 ENCOUNTER — Ambulatory Visit: Payer: 59

## 2022-07-04 ENCOUNTER — Telehealth: Payer: Self-pay

## 2022-07-04 ENCOUNTER — Encounter: Payer: Self-pay | Admitting: Gastroenterology

## 2022-07-04 DIAGNOSIS — R0789 Other chest pain: Secondary | ICD-10-CM | POA: Insufficient documentation

## 2022-07-04 DIAGNOSIS — K828 Other specified diseases of gallbladder: Secondary | ICD-10-CM | POA: Insufficient documentation

## 2022-07-04 DIAGNOSIS — K219 Gastro-esophageal reflux disease without esophagitis: Secondary | ICD-10-CM | POA: Insufficient documentation

## 2022-07-04 DIAGNOSIS — M79605 Pain in left leg: Secondary | ICD-10-CM

## 2022-07-04 DIAGNOSIS — K5909 Other constipation: Secondary | ICD-10-CM | POA: Insufficient documentation

## 2022-07-04 DIAGNOSIS — K449 Diaphragmatic hernia without obstruction or gangrene: Secondary | ICD-10-CM | POA: Insufficient documentation

## 2022-07-04 DIAGNOSIS — Z1211 Encounter for screening for malignant neoplasm of colon: Secondary | ICD-10-CM | POA: Insufficient documentation

## 2022-07-04 DIAGNOSIS — K8020 Calculus of gallbladder without cholecystitis without obstruction: Secondary | ICD-10-CM | POA: Insufficient documentation

## 2022-07-04 DIAGNOSIS — R1110 Vomiting, unspecified: Secondary | ICD-10-CM | POA: Insufficient documentation

## 2022-07-04 LAB — SURGICAL PATHOLOGY

## 2022-07-04 NOTE — Telephone Encounter (Signed)
Recall has been entered  

## 2022-07-04 NOTE — Telephone Encounter (Signed)
-----   Message from Irving Copas., MD sent at 07/04/2022  4:39 AM EDT ----- Regarding: Cologuard retesting Sion Thane, Please make sure this patient has a colon cancer screening follow-up for January 2026.  Tentative Cologuard since she did that previously but she will be due for some form of colon cancer screening so despite a recall in. Thanks. GM

## 2022-07-07 ENCOUNTER — Ambulatory Visit: Payer: 59 | Admitting: Family Medicine

## 2022-07-16 ENCOUNTER — Encounter: Payer: 59 | Admitting: Gastroenterology

## 2022-07-23 ENCOUNTER — Encounter: Payer: Self-pay | Admitting: Family Medicine

## 2022-07-23 ENCOUNTER — Ambulatory Visit (INDEPENDENT_AMBULATORY_CARE_PROVIDER_SITE_OTHER): Payer: 59 | Admitting: Family Medicine

## 2022-07-23 VITALS — BP 130/72 | HR 61 | Temp 97.6°F | Resp 16 | Ht 69.5 in | Wt 282.4 lb

## 2022-07-23 DIAGNOSIS — M2550 Pain in unspecified joint: Secondary | ICD-10-CM | POA: Diagnosis not present

## 2022-07-23 DIAGNOSIS — R0789 Other chest pain: Secondary | ICD-10-CM | POA: Diagnosis not present

## 2022-07-23 DIAGNOSIS — K1379 Other lesions of oral mucosa: Secondary | ICD-10-CM

## 2022-07-23 DIAGNOSIS — K029 Dental caries, unspecified: Secondary | ICD-10-CM

## 2022-07-23 DIAGNOSIS — R222 Localized swelling, mass and lump, trunk: Secondary | ICD-10-CM

## 2022-07-23 DIAGNOSIS — R519 Headache, unspecified: Secondary | ICD-10-CM

## 2022-07-23 MED ORDER — CLINDAMYCIN HCL 300 MG PO CAPS: 300.0000 mg | ORAL_CAPSULE | Freq: Three times a day (TID) | ORAL | 0 refills | Status: DC

## 2022-07-23 MED ORDER — MELOXICAM 7.5 MG PO TABS: 7.5000 mg | ORAL_TABLET | Freq: Every day | ORAL | 0 refills | Status: DC

## 2022-07-23 NOTE — Progress Notes (Signed)
Subjective:  Patient ID: Terri Wiley, female    DOB: 11-23-1976  Age: 46 y.o. MRN: [id number]  CC:  Chief Complaint  Patient presents with   Jaw Pain   Results    Discuss breast biopsy, review head scan     HPI [name] presents for  Left-sided face pain: Discussed in May.  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  Dental follow-up recommended.  Prior maxillofacial CT without concerns and no significant paranasal sinus disease.  There were not definitive findings of TMJ osteoarthrosis on that imaging May 10.  We deferred NSAIDs given history of reflux and need of higher dose of PPI previously.  Of note she was seen by gastroenterology July 5 and Protonix increased to 40 mg twice daily with planned endoscopy.  Still having some left sided face pain. Some puffiness and soreness in left cheek and under eye towards ear. Has broken tooth in upper left side past week and a half.  New swelling in cheek past 4 days.  No measured fever - tmax 99 Plans on new dental insurance - no recent dentist appt. Has not had guard made - still grinding teeth.  Hot and cold sensitive in area.  PCN allergic. Hives.    Supraclavicular fossa fullness Discussed at May visit.  Left-sided.  Has undergone mammogram, ultrasound and biopsy of left breast mass.  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  Fullness on left side still there. About the same. Slight soreness at times.   Still taking meloxicam as needed for back pain - every 2 weeks or so. Needs refill.    History Patient Active Problem List   Diagnosis Date Noted   Gastroesophageal reflux disease 07/04/2022   Atypical chest pain 07/04/2022   Regurgitation of food 07/04/2022   Hiatal hernia 07/04/2022   Chronic constipation 07/04/2022   Colon cancer screening 07/04/2022   Calculus of gallbladder without cholecystitis without obstruction 07/04/2022   Dysfunctional gallbladder 07/04/2022   LLQ pain 07/11/2021   Hot  flashes 07/11/2021   Papanicolaou smear of cervix with positive high risk human papilloma virus (HPV) test 06/24/2021   Trichimoniasis 06/24/2021   Menorrhagia with irregular cycle 06/19/2021   Fibroids 06/19/2021   Perimenopause 06/19/2021   Routine Papanicolaou smear 06/19/2021   Chronic left hip pain 02/28/2021   Mass of left hip region 02/28/2021   Past Medical History:  Diagnosis Date   GERD (gastroesophageal reflux disease)    Hernia cerebri ([place])    Papanicolaou smear of cervix with positive high risk human papilloma virus (HPV) test 06/24/2021   06/24/21 repeat pap in 1 year per ASCCP guidelines    Trichimoniasis 06/24/2021   Treated 06/24/21, POC___________   Past Surgical History:  Procedure Laterality Date   HERNIA REPAIR     tubiligation     Allergies  Allergen Reactions   Bee Venom Shortness Of Breath   Penicillins Hives   Prior to Admission medications   Medication Sig Start Date End Date Taking? Authorizing Provider  acetaminophen (TYLENOL) 325 MG tablet Take 650 mg by mouth every 6 (six) hours as needed. pain   Yes [provider]  aspirin 81 MG EC tablet Take 81 mg by mouth daily. Swallow whole.   Yes [provider]  cetirizine (ZYRTEC) 10 MG tablet Take 1 tablet (10 mg total) by mouth daily. 04/13/22  Yes Wendie Agreste, MD  FEROSUL 325 (65 Fe) MG tablet Take 325 mg by mouth daily. 12/02/20  Yes [provider]  furosemide (LASIX) 40 MG tablet Take 40 mg by mouth daily as needed for fluid or edema. 04/30/21  Yes [provider]  LORazepam (ATIVAN) 0.5 MG tablet Take 0.5 mg by mouth daily as needed for anxiety. 10/08/20  Yes [provider]  meloxicam (MOBIC) 7.5 MG tablet Take 1 tablet (7.5 mg total) by mouth daily. 01/23/22  Yes Wendie Agreste, MD  Norethindrone-Ethinyl Estradiol-Fe Biphas (LO LOESTRIN FE) 1 MG-10 MCG / 10 MCG tablet Take 1 tablet by mouth daily. Take 1 daily by mouth 07/11/21  Yes Derrek Monaco A,  NP  pantoprazole (PROTONIX) 40 MG tablet Take 1 tablet (40 mg total) by mouth 2 (two) times daily. 07/02/22  Yes Mansouraty, Telford Nab., MD  Throat Lozenges (RA VITAMIN C COUGH DROPS MT) Use as directed 2 lozenges in the mouth or throat daily.   Yes [provider]   Social History   Socioeconomic History   Marital status: Divorced    Spouse name: Not on file   Number of children: Not on file   Years of education: Not on file   Highest education level: Not on file  Occupational History   Not on file  Tobacco Use   Smoking status: Never   Smokeless tobacco: Never  Vaping Use   Vaping Use: Never used  Substance and Sexual Activity   Alcohol use: No   Drug use: No   Sexual activity: Yes    Birth control/protection: Surgical    Comment: tubal  Other Topics Concern   Not on file  Social History Narrative   Not on file   Social Determinants of Health   Financial Resource Strain: Medium Risk (06/19/2021)   Overall Financial Resource Strain (CARDIA)    Difficulty of Paying Living Expenses: Somewhat hard  Food Insecurity: No Food Insecurity (06/19/2021)   Hunger Vital Sign    Worried About Running Out of Food in the Last Year: Never true    Ran Out of Food in the Last Year: Never true  Transportation Needs: No Transportation Needs (06/19/2021)   PRAPARE - Transportation    Lack of Transportation (Medical): No    Lack of Transportation (Non-Medical): No  Physical Activity: Insufficiently Active (06/19/2021)   Exercise Vital Sign    Days of Exercise per Week: 2 days    Minutes of Exercise per Session: 30 min  Stress: Stress Concern Present (06/19/2021)   [place]    Feeling of Stress : To some extent  Social Connections: Moderately Isolated (06/19/2021)   Social Connection and Isolation Panel [NHANES]    Frequency of Communication with Friends and Family: Three times a week    Frequency of Social  Gatherings with Friends and Family: Once a week    Attends Religious Services: 1 to 4 times per year    Active Member of [employer] or Organizations: No    Attends Archivist Meetings: Never    Marital Status: Divorced  Human resources officer Violence: Not At Risk (06/19/2021)   Humiliation, Afraid, Rape, and Kick questionnaire    Fear of Current or Ex-Partner: No    Emotionally Abused: No    Physically Abused: No    Sexually Abused: No    Review of Systems Per HPI  Objective:   Vitals:   07/23/22 1105  BP: 130/72  Pulse: 61  Resp: 16  Temp: 97.6 F (36.4 C)  TempSrc: Oral  SpO2: 98%  Weight:  282 lb 6.4 oz (128.1 kg)  Height: 5' 9.5" (1.765 m)     Physical Exam Vitals reviewed.  Constitutional:      General: She is not in acute distress.    Appearance: Normal appearance. She is well-developed.  HENT:     Head: Normocephalic and atraumatic.     Comments: Tender at TMJ on left, temple nontender.  Minimal maxillary sinus tenderness.  No significant swelling appreciated left versus right.  Skin intact without erythema.    Mouth/Throat:     Comments: Multiple missing teeth, broken left upper tooth with some surrounding gum erythema, minimal swelling, sensitivity, no appreciable abscess noted. Neck:     Comments: No appreciable lymphadenopathy, supraclavicular fullness on left without focal mass appreciated or firm areas.  Minimal discomfort. Cardiovascular:     Rate and Rhythm: Normal rate.  Pulmonary:     Effort: Pulmonary effort is normal.  Neurological:     Mental Status: She is alert and oriented to person, place, and time.  Psychiatric:        Mood and Affect: Mood normal.        Assessment & Plan:  Mita Vallo is a 46 y.o. female . Mouth pain - Plan: clindamycin (CLEOCIN) 300 MG capsule Dental caries - Plan: clindamycin (CLEOCIN) 300 MG capsule Face pain - Plan: clindamycin (CLEOCIN) 300 MG capsule  -DKA on upper left as above, no apparent true abscess  or discharge seen.  Minimal gum edema, erythema.  Will cover with clindamycin temporarily until she can establish with dentist.  ER precautions given if any increased swelling, facial swelling, fever or worsening symptoms.  Polyarthralgia - Plan: meloxicam (MOBIC) 7.5 MG tablet Chest wall pain - Plan: meloxicam (MOBIC) 7.5 MG tablet  -Arthralgias improved, intermittent meloxicam, stable, continue same with potential side effects previously discussed.  RTC precautions if persistent/frequent need.  Supraclavicular fossa fullness - Plan: US Soft Tissue Head/Neck (NON-THYROID)  -Breast imaging as above overall reassuring.  Check ultrasound of neck, and depending on results may need ENT eval.  Also may need to follow-up with ENT if persistent face pain but suspected TMJ syndrome with some improvement.  Dental guard may be helpful, follow-up with dentist as above  Meds ordered this encounter  Medications   meloxicam (MOBIC) 7.5 MG tablet    Sig: Take 1 tablet (7.5 mg total) by mouth daily.    Dispense:  30 tablet    Refill:  0   clindamycin (CLEOCIN) 300 MG capsule    Sig: Take 1 capsule (300 mg total) by mouth 3 (three) times daily.    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  See information below.  Follow-up with dentist as we discussed previously for dental guard as that may be helpful.  I can also write a short-term muscle relaxer at bedtime if that would be helpful.  Let me know.  I will order an ultrasound to evaluate the left neck and fullness above the collarbone.  Depending on those results may need to see ear nose and throat specialist.  I will let you know.  Keep follow-up with gastroenterology including planned endoscopy.  It is important that you see a dentist as soon as possible.  I do not see an abscess around the tooth today but I will start an antibiotic given the swelling and increased pain.  If you notice  any increased pain, swelling, discharge  from the area or face swelling, be seen immediately through the ER or dentist that day as abscess can form.  Dental Pain Dental pain is often a sign that something is wrong with your teeth or gums. It is also something that can occur following dental treatment. If you have dental pain, it is important to contact your dental care provider, especially if the cause of the pain has not been determined. Dental pain may be of varying intensity and can be caused by many things, including: Tooth decay (cavities or caries). Cavities are caused by bacteria that produce acids that irritate the nerve of your tooth, making it sensitive to air and hot or cold temperatures. This eventually causes discomfort or pain. Abscess or infection. Once the bacteria reach the inner part of the tooth (pulp), a bacterial infection (dental abscess) can occur. Pus typically collects at the end of the root of a tooth. Injury. A crack in the tooth. Gum recession exposing the root, and possibly the nerves, of a tooth. Gum (periodontal)disease. Abnormal grinding or clenching. Poor or improper home care. An unknown reason (idiopathic). Your pain may be mild or severe. It may occur when you are: Chewing. Exposed to hot or cold temperatures. Eating or drinking sugary foods or beverages, such as soda or candy. Your pain may be constant, or it may come and go without cause. Follow these instructions at home: The following actions may help to lessen any discomfort that you are feeling before or after getting dental care. Medicines Take over-the-counter and prescription medicines only as told by your dental care provider. If you were prescribed an antibiotic medicine, take it as told by your dental care provider. Do not stop taking the antibiotic even if you start to feel better. Eating and drinking Avoid foods or drinks that cause you pain, such as: Very hot or very cold foods or  drinks. Sweet or sugary foods or drinks. Managing pain and swelling  Ice can sometimes be used to reduce pain and swelling, especially if the pain is following dental treatment. If directed, put ice on the painful area of your face. To do this: Put ice in a plastic bag. Place a towel between your skin and the bag. Leave the ice on for 20 minutes, 2-3 times a day. Remove the ice if your skin turns bright red. This is very important. If you cannot feel pain, heat, or cold, you have a greater risk of damage to the area. Brushing your teeth To keep your mouth and gums healthy, brush your teeth twice a day using a fluoride toothpaste. Use a toothpaste made for sensitive teeth as directed by your dental care provider, especially if the root is exposed. Always brush your teeth with a soft-bristled toothbrush. This will help prevent irritation to your gums. General instructions Floss at least once a day. Do not apply heat to the outside of the face. Gargle with a mixture of salt and water 3-4 times a day or as needed. To make salt water, completely dissolve -1 tsp (3-6 g) of salt in 1 cup (237 mL) of warm water. Keep all follow-up visits. This is important. Contact a dental care provider if: You have any unexplained dental pain. Your pain is not controlled with medicines. Your symptoms get worse. You have new symptoms. Get help right away if: You are unable to open your mouth. You are having trouble breathing or swallowing. You have a fever. You notice that your face, neck, or jaw is swollen.  These symptoms may represent a serious problem that is an emergency. Do not wait to see if the symptoms will go away. Get medical help right away. Call your local emergency services (911 in the [place] Do not drive yourself to the hospital. Summary Dental pain may be caused by many things, including tooth decay and infection. Your pain may be mild or severe. Take over-the-counter and prescription  medicines only as told by your dental care provider. Watch your dental pain for any changes. Let your dental care provider know if your symptoms get worse. This information is not intended to replace advice given to you by your health care provider. Make sure you discuss any questions you have with your health care provider. Document Revised: 09/19/2020 Document Reviewed: 09/19/2020 Elsevier Patient Education  [place].   Temporomandibular Joint Syndrome  Temporomandibular joint syndrome (TMJ syndrome) is a condition that causes pain in the temporomandibular joints. These joints are located near your ears and allow your jaw to open and close. For people with TMJ syndrome, chewing, biting, or other movements of the jaw can be difficult or painful. TMJ syndrome is often mild and goes away within a few weeks. However, sometimes the condition becomes a long-term (chronic) problem. What are the causes? This condition may be caused by: Grinding your teeth or clenching your jaw. Some people do this when they are stressed. Arthritis. An injury to the jaw. A head or neck injury. Teeth or dentures that are not aligned well. In some cases, the cause of TMJ syndrome may not be known. What are the signs or symptoms? The most common symptom of this condition is aching pain on the side of the head in the area of the TMJ. Other symptoms may include: Pain when moving your jaw, such as when chewing or biting. Not being able to open your jaw all the way. Making a clicking sound when you open your mouth. Headache. Earache. Neck or shoulder pain. How is this diagnosed? This condition may be diagnosed based on: Your symptoms and medical history. A physical exam. Your health care provider may check the range of motion of your jaw. Imaging tests, such as X-rays or an MRI. You may also need to see your dentist, who will check if your teeth and jaw are lined up correctly. How is this treated? TMJ  syndrome often goes away on its own. If treatment is needed, it may include: Eating soft foods and applying ice or heat. Medicines to relieve pain or inflammation. Medicines or massage to relax the muscles. A splint, bite plate, or mouthpiece to prevent teeth grinding or jaw clenching. Relaxation techniques or counseling to help reduce stress. A therapy for pain in which an electrical current is applied to the nerves through the skin (transcutaneous electrical nerve stimulation). Acupuncture. This may help to relieve pain. Jaw surgery. This is rarely needed. Follow these instructions at home:  Eating and drinking Eat a soft diet if you are having trouble chewing. Avoid foods that require a lot of chewing. Do not chew gum. General instructions Take over-the-counter and prescription medicines only as told by your health care provider. If directed, put ice on the painful area. To do this: Put ice in a plastic bag. Place a towel between your skin and the bag. Leave the ice on for 20 minutes, 2-3 times a day. Remove the ice if your skin turns bright red. This is very important. If you cannot feel pain, heat, or cold, you have a  greater risk of damage to the area. Apply a warm, wet cloth (warm compress) to the painful area as told. Massage your jaw area and do any jaw stretching exercises as told by your health care provider. If you were given a splint, bite plate, or mouthpiece, wear it as told by your health care provider. Keep all follow-up visits. This is important. Where to find more information [place]: https://avila-olson.com/ Contact a health care provider if: You have trouble eating. You have new or worsening symptoms. Get help right away if: Your jaw locks. Summary Temporomandibular joint syndrome (TMJ syndrome) is a condition that causes pain in the temporomandibular joints. These joints are located near your ears and allow your jaw to  open and close. TMJ syndrome is often mild and goes away within a few weeks. However, sometimes the condition becomes a long-term (chronic) problem. Symptoms include an aching pain on the side of the head in the area of the TMJ, pain when chewing or biting, and being unable to open your jaw all the way. You may also make a clicking sound when you open your mouth. TMJ syndrome often goes away on its own. If treatment is needed, it may include medicines to relieve pain, reduce inflammation, or relax the muscles. A splint, bite plate, or mouthpiece may also be used to prevent teeth grinding or jaw clenching. This information is not intended to replace advice given to you by your health care provider. Make sure you discuss any questions you have with your health care provider. Document Revised: 07/28/2021 Document Reviewed: 07/28/2021 Elsevier Patient Education  [place],   Merri Ray, MD [place] 07/23/22 12:06 PM

## 2022-07-23 NOTE — Patient Instructions (Signed)
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  See information below.  Follow-up with dentist as we discussed previously for dental guard as that may be helpful.  I can also write a short-term muscle relaxer at bedtime if that would be helpful.  Let me know.  I will order an ultrasound to evaluate the left neck and fullness above the collarbone.  Depending on those results may need to see ear nose and throat specialist.  I will let you know.  Keep follow-up with gastroenterology including planned endoscopy.  It is important that you see a dentist as soon as possible.  I do not see an abscess around the tooth today but I will start an antibiotic given the swelling and increased pain.  If you notice any increased pain, swelling, discharge from the area or face swelling, be seen immediately through the ER or dentist that day as abscess can form.  Dental Pain Dental pain is often a sign that something is wrong with your teeth or gums. It is also something that can occur following dental treatment. If you have dental pain, it is important to contact your dental care provider, especially if the cause of the pain has not been determined. Dental pain may be of varying intensity and can be caused by many things, including: Tooth decay (cavities or caries). Cavities are caused by bacteria that produce acids that irritate the nerve of your tooth, making it sensitive to air and hot or cold temperatures. This eventually causes discomfort or pain. Abscess or infection. Once the bacteria reach the inner part of the tooth (pulp), a bacterial infection (dental abscess) can occur. Pus typically collects at the end of the root of a tooth. Injury. A crack in the tooth. Gum recession exposing the root, and possibly the nerves, of a tooth. Gum (periodontal)disease. Abnormal grinding or clenching. Poor or improper home care. An unknown reason (idiopathic). Your pain may be mild or severe. It may occur when you  are: Chewing. Exposed to hot or cold temperatures. Eating or drinking sugary foods or beverages, such as soda or candy. Your pain may be constant, or it may come and go without cause. Follow these instructions at home: The following actions may help to lessen any discomfort that you are feeling before or after getting dental care. Medicines Take over-the-counter and prescription medicines only as told by your dental care provider. If you were prescribed an antibiotic medicine, take it as told by your dental care provider. Do not stop taking the antibiotic even if you start to feel better. Eating and drinking Avoid foods or drinks that cause you pain, such as: Very hot or very cold foods or drinks. Sweet or sugary foods or drinks. Managing pain and swelling  Ice can sometimes be used to reduce pain and swelling, especially if the pain is following dental treatment. If directed, put ice on the painful area of your face. To do this: Put ice in a plastic bag. Place a towel between your skin and the bag. Leave the ice on for 20 minutes, 2-3 times a day. Remove the ice if your skin turns bright red. This is very important. If you cannot feel pain, heat, or cold, you have a greater risk of damage to the area. Brushing your teeth To keep your mouth and gums healthy, brush your teeth twice a day using a fluoride toothpaste. Use a toothpaste made for sensitive teeth as directed by your dental care provider, especially if the  root is exposed. Always brush your teeth with a soft-bristled toothbrush. This will help prevent irritation to your gums. General instructions Floss at least once a day. Do not apply heat to the outside of the face. Gargle with a mixture of salt and water 3-4 times a day or as needed. To make salt water, completely dissolve -1 tsp (3-6 g) of salt in 1 cup (237 mL) of warm water. Keep all follow-up visits. This is important. Contact a dental care provider if: You have any  unexplained dental pain. Your pain is not controlled with medicines. Your symptoms get worse. You have new symptoms. Get help right away if: You are unable to open your mouth. You are having trouble breathing or swallowing. You have a fever. You notice that your face, neck, or jaw is swollen. These symptoms may represent a serious problem that is an emergency. Do not wait to see if the symptoms will go away. Get medical help right away. Call your local emergency services (911 in the [place] Do not drive yourself to the hospital. Summary Dental pain may be caused by many things, including tooth decay and infection. Your pain may be mild or severe. Take over-the-counter and prescription medicines only as told by your dental care provider. Watch your dental pain for any changes. Let your dental care provider know if your symptoms get worse. This information is not intended to replace advice given to you by your health care provider. Make sure you discuss any questions you have with your health care provider. Document Revised: 09/19/2020 Document Reviewed: 09/19/2020 Elsevier Patient Education  [employer].   Temporomandibular Joint Syndrome  Temporomandibular joint syndrome (TMJ syndrome) is a condition that causes pain in the temporomandibular joints. These joints are located near your ears and allow your jaw to open and close. For people with TMJ syndrome, chewing, biting, or other movements of the jaw can be difficult or painful. TMJ syndrome is often mild and goes away within a few weeks. However, sometimes the condition becomes a long-term (chronic) problem. What are the causes? This condition may be caused by: Grinding your teeth or clenching your jaw. Some people do this when they are stressed. Arthritis. An injury to the jaw. A head or neck injury. Teeth or dentures that are not aligned well. In some cases, the cause of TMJ syndrome may not be known. What are the signs or  symptoms? The most common symptom of this condition is aching pain on the side of the head in the area of the TMJ. Other symptoms may include: Pain when moving your jaw, such as when chewing or biting. Not being able to open your jaw all the way. Making a clicking sound when you open your mouth. Headache. Earache. Neck or shoulder pain. How is this diagnosed? This condition may be diagnosed based on: Your symptoms and medical history. A physical exam. Your health care provider may check the range of motion of your jaw. Imaging tests, such as X-rays or an MRI. You may also need to see your dentist, who will check if your teeth and jaw are lined up correctly. How is this treated? TMJ syndrome often goes away on its own. If treatment is needed, it may include: Eating soft foods and applying ice or heat. Medicines to relieve pain or inflammation. Medicines or massage to relax the muscles. A splint, bite plate, or mouthpiece to prevent teeth grinding or jaw clenching. Relaxation techniques or counseling to help reduce stress. A therapy  for pain in which an electrical current is applied to the nerves through the skin (transcutaneous electrical nerve stimulation). Acupuncture. This may help to relieve pain. Jaw surgery. This is rarely needed. Follow these instructions at home:  Eating and drinking Eat a soft diet if you are having trouble chewing. Avoid foods that require a lot of chewing. Do not chew gum. General instructions Take over-the-counter and prescription medicines only as told by your health care provider. If directed, put ice on the painful area. To do this: Put ice in a plastic bag. Place a towel between your skin and the bag. Leave the ice on for 20 minutes, 2-3 times a day. Remove the ice if your skin turns bright red. This is very important. If you cannot feel pain, heat, or cold, you have a greater risk of damage to the area. Apply a warm, wet cloth (warm compress) to  the painful area as told. Massage your jaw area and do any jaw stretching exercises as told by your health care provider. If you were given a splint, bite plate, or mouthpiece, wear it as told by your health care provider. Keep all follow-up visits. This is important. Where to find more information [place]: [email or web address]-olson.[email or web address]/ Contact a health care provider if: You have trouble eating. You have new or worsening symptoms. Get help right away if: Your jaw locks. Summary Temporomandibular joint syndrome (TMJ syndrome) is a condition that causes pain in the temporomandibular joints. These joints are located near your ears and allow your jaw to open and close. TMJ syndrome is often mild and goes away within a few weeks. However, sometimes the condition becomes a long-term (chronic) problem. Symptoms include an aching pain on the side of the head in the area of the TMJ, pain when chewing or biting, and being unable to open your jaw all the way. You may also make a clicking sound when you open your mouth. TMJ syndrome often goes away on its own. If treatment is needed, it may include medicines to relieve pain, reduce inflammation, or relax the muscles. A splint, bite plate, or mouthpiece may also be used to prevent teeth grinding or jaw clenching. This information is not intended to replace advice given to you by your health care provider. Make sure you discuss any questions you have with your health care provider. Document Revised: 07/28/2021 Document Reviewed: 07/28/2021 Elsevier Patient Education  [place].

## 2022-08-07 ENCOUNTER — Other Ambulatory Visit: Payer: Self-pay | Admitting: Family Medicine

## 2022-08-07 MED ORDER — FUROSEMIDE 40 MG PO TABS: 40.0000 mg | ORAL_TABLET | Freq: Every day | ORAL | 1 refills | Status: DC | PRN

## 2022-08-07 NOTE — Telephone Encounter (Signed)
Cardiology note in July, continued diuretic recommended.  Furosemide refilled.

## 2022-08-07 NOTE — Telephone Encounter (Signed)
Pt wants refill on lasix

## 2022-08-08 ENCOUNTER — Other Ambulatory Visit: Payer: Self-pay | Admitting: Family Medicine

## 2022-08-08 ENCOUNTER — Encounter: Payer: Self-pay | Admitting: Gastroenterology

## 2022-08-08 MED ORDER — FUROSEMIDE 40 MG PO TABS: 40.0000 mg | ORAL_TABLET | Freq: Every day | ORAL | 1 refills | Status: DC | PRN

## 2022-08-08 NOTE — Telephone Encounter (Signed)
Caller name: Terri Wiley   On DPR? :yes/no: Yes  Call back number: [phone]  Provider they see: Carlota Raspberry   Reason for call:  Pt called stating that her medication (lasix 40 mg) was sent to the wrong pharmacy. Pt medication was suppose to be sent [facility] in [place].

## 2022-08-08 NOTE — Telephone Encounter (Signed)
Pt rx was sent to [facility] and not [facility], please resend, pended below

## 2022-09-29 ENCOUNTER — Encounter: Payer: PRIVATE HEALTH INSURANCE | Admitting: Family Medicine

## 2022-11-19 IMAGING — CT CT ABDOMEN W/ CM
2 of 5 series · 15 of 46 positions shown, 17 images · IV contrast (APPLIED)
Comparison: Overlapping part of CT pelvis from 04/03/2021 and CT
chest from 10/02/2020.

CLINICAL DATA: Left abdominal tenderness. History of
gastroesophageal reflux disease.

EXAM:
CT ABDOMEN WITH CONTRAST
TECHNIQUE: Multidetector CT imaging of the abdomen was performed using the
standard protocol following bolus administration of intravenous
contrast.
CONTRAST:  100mL OMNIPAQUE IOHEXOL 300 MG/ML  SOLN

[Series 2: abd pel w · axial · 0.91mm/px · z∈[+1229,+1459]mm · 12 of 54 slices shown, 14 images]
[im 4/54  soft-tissue]
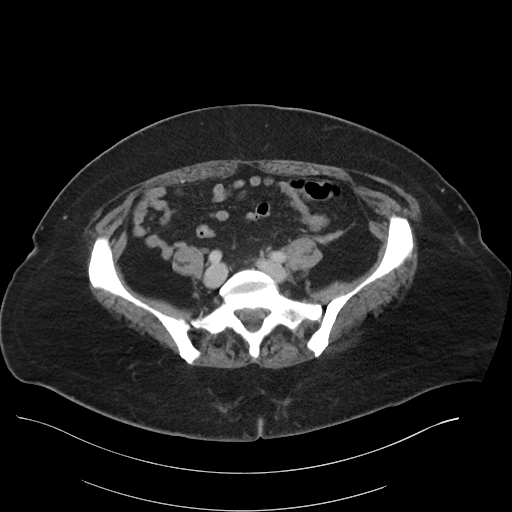
[im 4/54  bone]
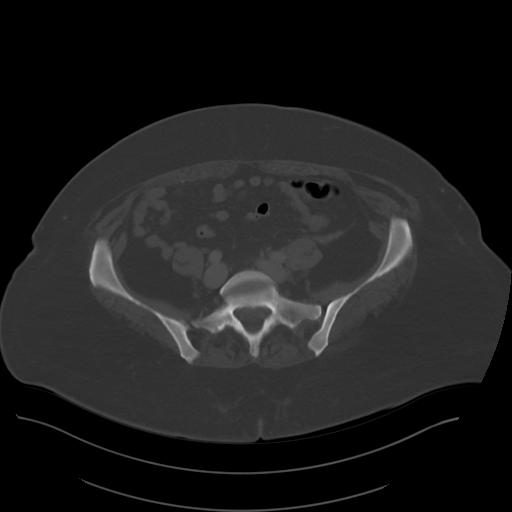
[im 8/54  soft-tissue]
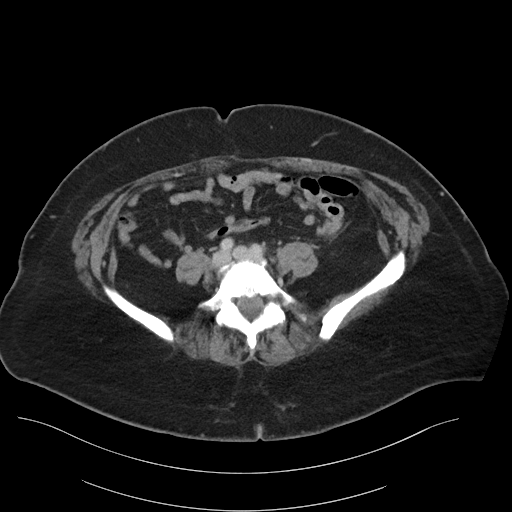
[im 11/54  soft-tissue]
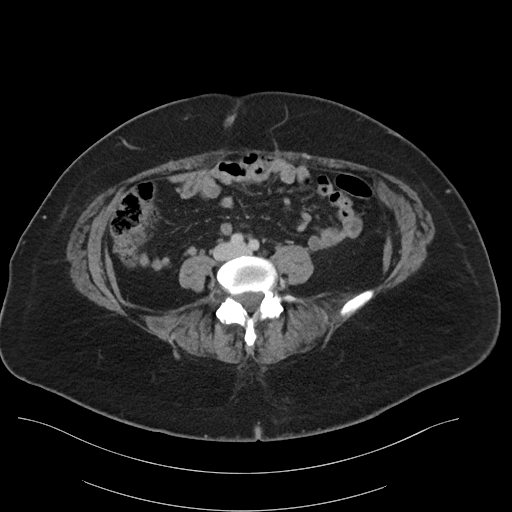
[im 18/54  soft-tissue]
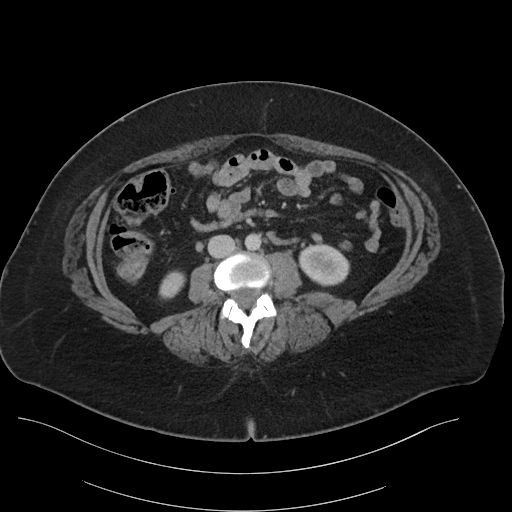
[im 22/54  soft-tissue]
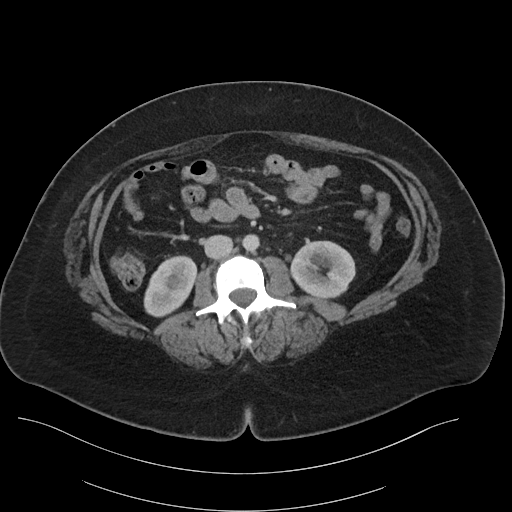
[im 25/54  soft-tissue]
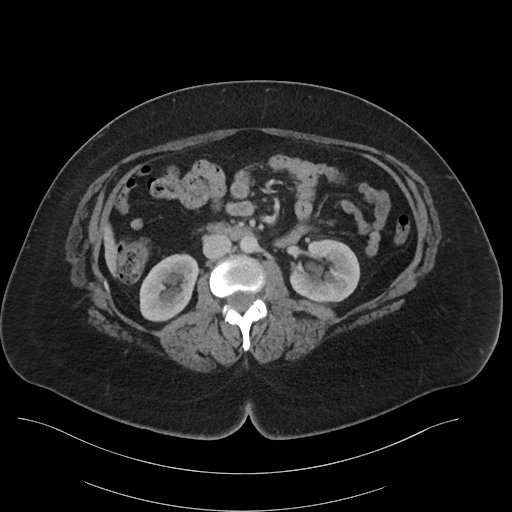
[im 29/54  soft-tissue]
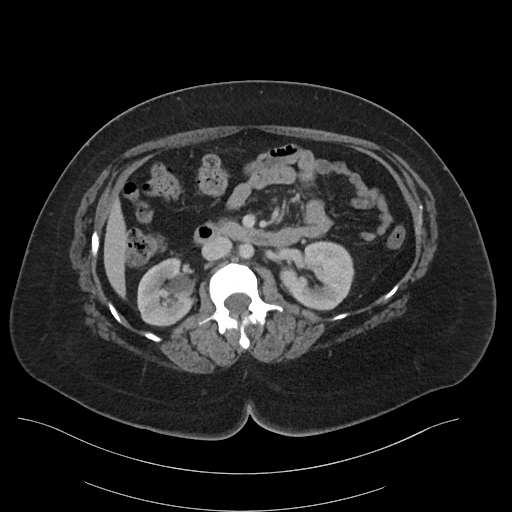
[im 32/54  soft-tissue]
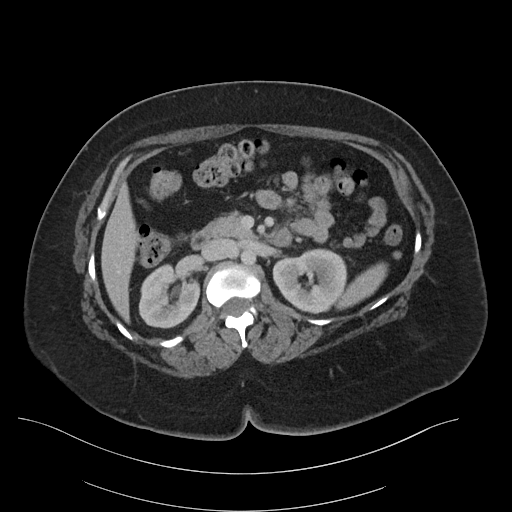
[im 36/54  soft-tissue]
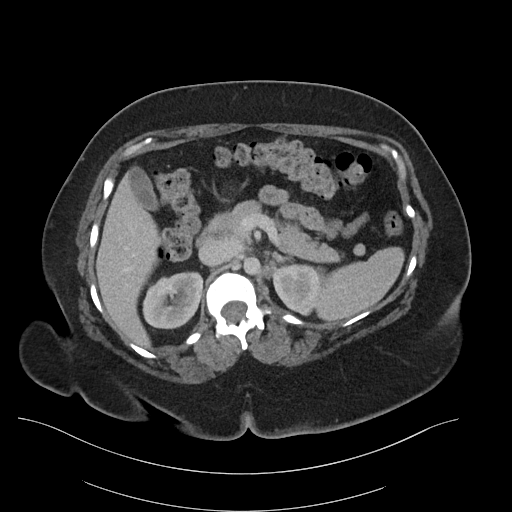
[im 36/54  bone]
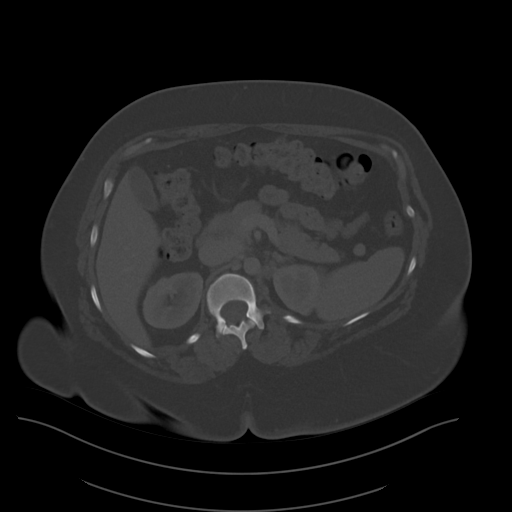
[im 43/54  soft-tissue]
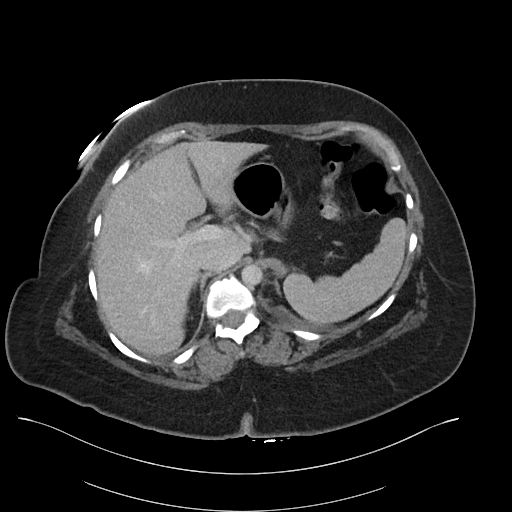
[im 46/54  soft-tissue]
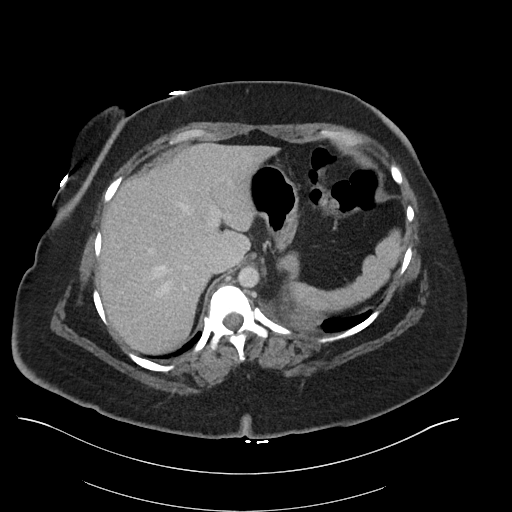
[im 50/54  soft-tissue]
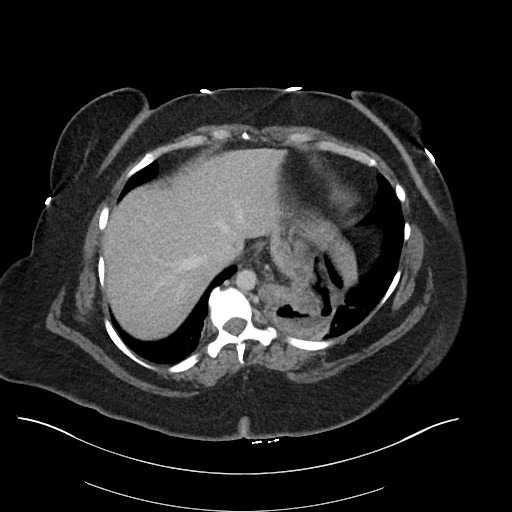

[Series 5: coronal · coronal · 0.56mm/px · 3 of 115 slices shown]
[im 39/115  soft-tissue]
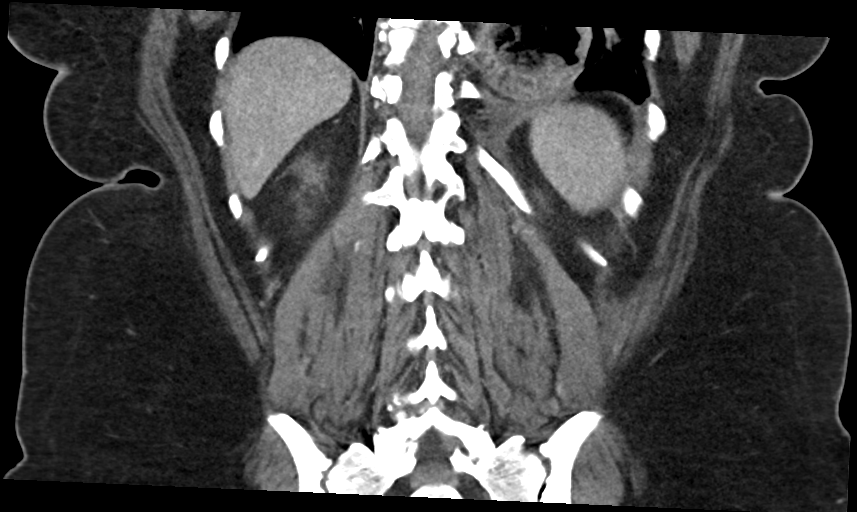
[im 51/115  soft-tissue]
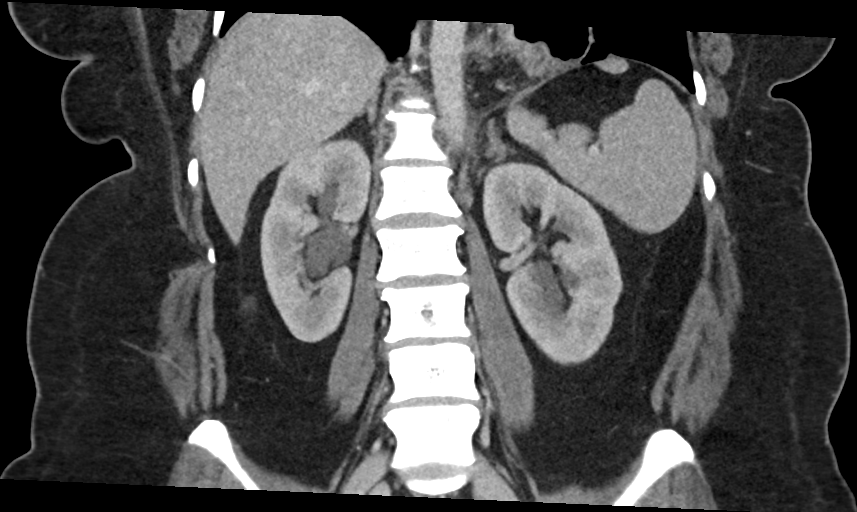
[im 64/115  soft-tissue]
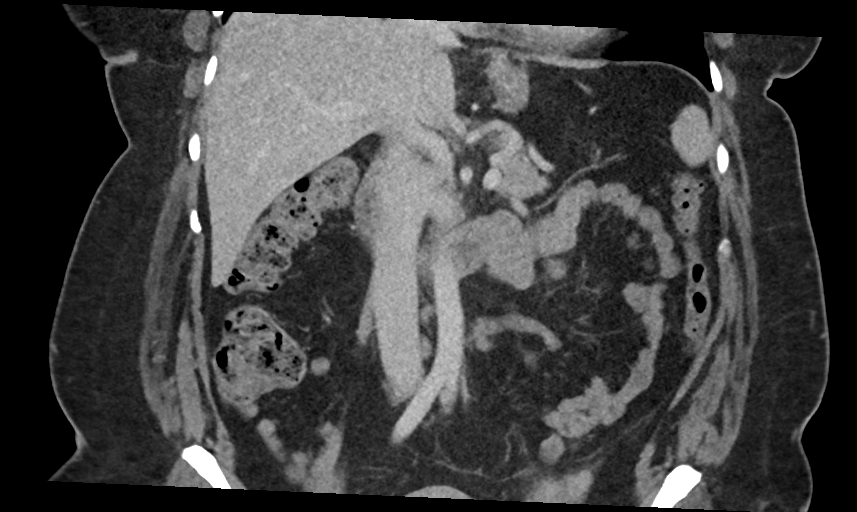

[15 of 46 positions shown; findings below may reference images not displayed]

FINDINGS: Lower chest: Moderate to large hiatal hernia containing much of the
stomach with a mild degree of organoaxial rotation. Mild adjacent
atelectasis in the left lower lobe.

Hepatobiliary: Faint density in the gallbladder is nonspecific and
could represent sludge or a subtle indicator of cholelithiasis. No
biliary dilatation. No significant focal liver lesions are
identified.

Pancreas: Unremarkable

Spleen: Unremarkable

Adrenals/Urinary Tract: Adrenal glands unremarkable.

0.4 cm left kidney lower pole calculus. Bilateral peripelvic cysts.
No hydronephrosis.

Stomach/Bowel: Moderate to large hiatal hernia with some
organo-axial rotation of the thoracic portion of the stomach.

Vascular/Lymphatic: Unremarkable

Other: No supplemental non-categorized findings.

Musculoskeletal: Mild dextroconvex thoracolumbar scoliosis with
rotary component.
IMPRESSION: 1. Moderate to large hiatal hernia with a mild degree of
organo-axial rotation of the intrathoracic component, and mild
adjacent atelectasis in the left lower lobe.
2. Gallbladder sludge versus less likely gallstones.
3. 0.4 cm nonobstructive left kidney lower pole calculus.
4. Mild dextroconvex thoracolumbar scoliosis with rotary component.

## 2023-01-16 ENCOUNTER — Ambulatory Visit (INDEPENDENT_AMBULATORY_CARE_PROVIDER_SITE_OTHER): Payer: Commercial Managed Care - HMO | Admitting: Family Medicine

## 2023-01-16 ENCOUNTER — Encounter: Payer: Self-pay | Admitting: Family Medicine

## 2023-01-16 VITALS — HR 78

## 2023-01-16 DIAGNOSIS — R112 Nausea with vomiting, unspecified: Secondary | ICD-10-CM

## 2023-01-16 DIAGNOSIS — R109 Unspecified abdominal pain: Secondary | ICD-10-CM

## 2023-01-16 DIAGNOSIS — N200 Calculus of kidney: Secondary | ICD-10-CM

## 2023-01-16 DIAGNOSIS — R1032 Left lower quadrant pain: Secondary | ICD-10-CM

## 2023-01-16 DIAGNOSIS — L509 Urticaria, unspecified: Secondary | ICD-10-CM

## 2023-01-16 DIAGNOSIS — R319 Hematuria, unspecified: Secondary | ICD-10-CM

## 2023-01-16 DIAGNOSIS — Z23 Encounter for immunization: Secondary | ICD-10-CM

## 2023-01-16 DIAGNOSIS — R1012 Left upper quadrant pain: Secondary | ICD-10-CM | POA: Diagnosis not present

## 2023-01-16 DIAGNOSIS — L853 Xerosis cutis: Secondary | ICD-10-CM

## 2023-01-16 DIAGNOSIS — R350 Frequency of micturition: Secondary | ICD-10-CM

## 2023-01-16 LAB — LIPASE: Lipase: 15 U/L (ref 11.0–59.0)

## 2023-01-16 LAB — COMPREHENSIVE METABOLIC PANEL
ALT: 10 U/L (ref 0–35)
AST: 13 U/L (ref 0–37)
Albumin: 3.7 g/dL (ref 3.5–5.2)
Alkaline Phosphatase: 84 U/L (ref 39–117)
BUN: 10 mg/dL (ref 6–23)
CO2: 27 mEq/L (ref 19–32)
Calcium: 8.9 mg/dL (ref 8.4–10.5)
Chloride: 106 mEq/L (ref 96–112)
Creatinine, Ser: 0.71 mg/dL (ref 0.40–1.20)
GFR: 100.42 mL/min (ref >60.00)
Glucose, Bld: 87 mg/dL (ref 70–99)
Potassium: 3.7 mEq/L (ref 3.5–5.1)
Sodium: 141 mEq/L (ref 135–145)
Total Bilirubin: 0.4 mg/dL (ref 0.2–1.2)
Total Protein: 7.6 g/dL (ref 6.0–8.3)

## 2023-01-16 LAB — POCT URINALYSIS DIP (MANUAL ENTRY)
Bilirubin, UA: NEGATIVE
Glucose, UA: NEGATIVE mg/dL
Nitrite, UA: NEGATIVE
Protein Ur, POC: NEGATIVE mg/dL
Spec Grav, UA: 1.02 (ref 1.010–1.025)
Urobilinogen, UA: 0.2 E.U./dL
pH, UA: 8 (ref 5.0–8.0)

## 2023-01-16 LAB — CBC
HCT: 37 % (ref 36.0–46.0)
Hemoglobin: 12.3 g/dL (ref 12.0–15.0)
MCHC: 33.1 g/dL (ref 30.0–36.0)
MCV: 90.6 fl (ref 78.0–100.0)
Platelets: 293 10*3/uL (ref 150.0–400.0)
RBC: 4.08 Mil/uL (ref 3.87–5.11)
RDW: 13.8 % (ref 11.5–15.5)
WBC: 6.3 10*3/uL (ref 4.0–10.5)

## 2023-01-16 MED ORDER — SULFAMETHOXAZOLE-TRIMETHOPRIM 800-160 MG PO TABS: 1.0000 | ORAL_TABLET | Freq: Two times a day (BID) | ORAL | 0 refills | Status: DC

## 2023-01-16 NOTE — Progress Notes (Signed)
Subjective:  Patient ID: Terri Wiley, female    DOB: 08-17-1976  Age: 47 y.o. MRN: [id number]  CC:  Chief Complaint  Patient presents with   Flank Pain    Pt notes Lt flank pain starting a few days ago, notes upset stomach, nausea and one bout of vomiting, notes also itching across her body, frequent urination     HPI Terri Wiley presents for   L flank pain Started 4 days ago - gnawing pain in left abdomen of left fllank, shoots around to the back. Worse pain.  Small kidney stone in past - 53m on left, nonobstuctive on CT 04/17/22.  No rash or blisters in area, but some itching all over and possible hives 2 days ago on hands, chest and side. Took bendaryl. No recurrence of hives, but still some skin itching all over.  Nausea yesterday, vomited once last night after eating. Nausea better today. Ate breakfast this morning. Increased thirst.  Frequent urination past week and nocturia.No hematuria, but darker urine. No dysuria. Similar sx;s in past with uti - years ago.  No diarrhea, no blood in stool.  Temp 101 last week, no recent fever.  Tx: alleve, ibuprofen.  Abdominal pain, Prior cologuard negative 02/05/22. No prior colonoscopy.   Feels different than prior left back pain.  Off meds until recent insurance change, hx of hiatal hernia, on protonix.     History Patient Active Problem List   Diagnosis Date Noted   Gastroesophageal reflux disease 07/04/2022   Atypical chest pain 07/04/2022   Regurgitation of food 07/04/2022   Hiatal hernia 07/04/2022   Chronic constipation 07/04/2022   Colon cancer screening 07/04/2022   Calculus of gallbladder without cholecystitis without obstruction 07/04/2022   Dysfunctional gallbladder 07/04/2022   LLQ pain 07/11/2021   Hot flashes 07/11/2021   Papanicolaou smear of cervix with positive high risk human papilloma virus (HPV) test 06/24/2021   Trichimoniasis 06/24/2021   Menorrhagia with irregular cycle 06/19/2021   Fibroids 06/19/2021    Perimenopause 06/19/2021   Routine Papanicolaou smear 06/19/2021   Chronic left hip pain 02/28/2021   Mass of left hip region 02/28/2021   Past Medical History:  Diagnosis Date   GERD (gastroesophageal reflux disease)    Hernia cerebri ([facility]    Papanicolaou smear of cervix with positive high risk human papilloma virus (HPV) test 06/24/2021   06/24/21 repeat pap in 1 year per ASCCP guidelines    Trichimoniasis 06/24/2021   Treated 06/24/21, POC___________   Past Surgical History:  Procedure Laterality Date   HERNIA REPAIR     tubiligation     Allergies  Allergen Reactions   Bee Venom Shortness Of Breath   Penicillins Hives   Prior to Admission medications   Medication Sig Start Date End Date Taking? Authorizing Provider  acetaminophen (TYLENOL) 325 MG tablet Take 650 mg by mouth every 6 (six) hours as needed. pain   Yes [provider]  aspirin 81 MG EC tablet Take 81 mg by mouth daily. Swallow whole.   Yes [provider]  cetirizine (ZYRTEC) 10 MG tablet Take 1 tablet (10 mg total) by mouth daily. 04/13/22  Yes GWendie Agreste MD  clindamycin (CLEOCIN) 300 MG capsule Take 1 capsule (300 mg total) by mouth 3 (three) times daily. 07/23/22  Yes GWendie Agreste MD  FEROSUL 325 (65 Fe) MG tablet Take 325 mg by mouth daily. 12/02/20  Yes [provider]  furosemide (LASIX) 40 MG tablet Take 1 tablet (40 mg  total) by mouth daily as needed for fluid or edema. 08/08/22  Yes Midge Minium, MD  LORazepam (ATIVAN) 0.5 MG tablet Take 0.5 mg by mouth daily as needed for anxiety. 10/08/20  Yes [provider]  meloxicam (MOBIC) 7.5 MG tablet Take 1 tablet (7.5 mg total) by mouth daily. 07/23/22  Yes Wendie Agreste, MD  Norethindrone-Ethinyl Estradiol-Fe Biphas (LO LOESTRIN FE) 1 MG-10 MCG / 10 MCG tablet Take 1 tablet by mouth daily. Take 1 daily by mouth 07/11/21  Yes Derrek Monaco A, NP  pantoprazole (PROTONIX) 40 MG tablet Take 1 tablet (40 mg  total) by mouth 2 (two) times daily. 07/02/22  Yes Mansouraty, Telford Nab., MD  Throat Lozenges (RA VITAMIN C COUGH DROPS MT) Use as directed 2 lozenges in the mouth or throat daily.   Yes [provider]   Social History   Socioeconomic History   Marital status: Divorced    Spouse name: Not on file   Number of children: Not on file   Years of education: Not on file   Highest education level: Not on file  Occupational History   Not on file  Tobacco Use   Smoking status: Never   Smokeless tobacco: Never  Vaping Use   Vaping Use: Never used  Substance and Sexual Activity   Alcohol use: No   Drug use: No   Sexual activity: Yes    Birth control/protection: Surgical    Comment: tubal  Other Topics Concern   Not on file  Social History Narrative   Not on file   Social Determinants of Health   Financial Resource Strain: Medium Risk (06/19/2021)   Overall Financial Resource Strain (CARDIA)    Difficulty of Paying Living Expenses: Somewhat hard  Food Insecurity: No Food Insecurity (06/19/2021)   Hunger Vital Sign    Worried About Running Out of Food in the Last Year: Never true    Ran Out of Food in the Last Year: Never true  Transportation Needs: No Transportation Needs (06/19/2021)   PRAPARE - Transportation    Lack of Transportation (Medical): No    Lack of Transportation (Non-Medical): No  Physical Activity: Insufficiently Active (06/19/2021)   Exercise Vital Sign    Days of Exercise per Week: 2 days    Minutes of Exercise per Session: 30 min  Stress: Stress Concern Present (06/19/2021)   [place]    Feeling of Stress : To some extent  Social Connections: Moderately Isolated (06/19/2021)   Social Connection and Isolation Panel [NHANES]    Frequency of Communication with Friends and Family: Three times a week    Frequency of Social Gatherings with Friends and Family: Once a week    Attends Religious  Services: 1 to 4 times per year    Active Member of [employer] or Organizations: No    Attends Archivist Meetings: Never    Marital Status: Divorced  Human resources officer Violence: Not At Risk (06/19/2021)   Humiliation, Afraid, Rape, and Kick questionnaire    Fear of Current or Ex-Partner: No    Emotionally Abused: No    Physically Abused: No    Sexually Abused: No    Review of Systems   Objective:   Vitals:   01/16/23 1046  Pulse: 78  SpO2: (!) 78%     Physical Exam Vitals reviewed.  Constitutional:      General: She is not in acute distress.    Appearance: Normal appearance.  She is well-developed. She is not ill-appearing, toxic-appearing or diaphoretic.  HENT:     Head: Normocephalic and atraumatic.  Eyes:     Conjunctiva/sclera: Conjunctivae normal.     Pupils: Pupils are equal, round, and reactive to light.  Neck:     Vascular: No carotid bruit.  Cardiovascular:     Rate and Rhythm: Normal rate and regular rhythm.     Heart sounds: Normal heart sounds.  Pulmonary:     Effort: Pulmonary effort is normal.     Breath sounds: Normal breath sounds.  Abdominal:     General: There is no distension.     Palpations: Abdomen is soft. There is no pulsatile mass.     Tenderness: There is abdominal tenderness (Suprapubic, left lower quadrant, left upper quadrant without rebound or guarding.  Slight CVA tenderness on the left, but over area near paraspinal musculature.  No midline bony tenderness.). There is left CVA tenderness. There is no right CVA tenderness or guarding.  Musculoskeletal:     Right lower leg: No edema.     Left lower leg: No edema.  Skin:    General: Skin is warm and dry.     Findings: No rash.     Comments: No rash or urticarial lesions.  Skin intact.  Neurological:     Mental Status: She is alert and oriented to person, place, and time.  Psychiatric:        Mood and Affect: Mood normal.        Behavior: Behavior normal.    58 minutes spent  during visit, including chart review, counseling and assimilation of information, discussion of various concerns above including abdominal pain, flank pain, hives, exam, discussion of plan and coordination of imaging, and chart completion.      Assessment & Plan:  Terri Wiley is a 47 y.o. female . Flank pain - Plan: POCT urinalysis dipstick, CBC, CT ABDOMEN PELVIS W WO CONTRAST, CANCELED: CT Abdomen Pelvis W Contrast, CANCELED: CT ABDOMEN PELVIS W WO CONTRAST Left lower quadrant abdominal pain - Plan: CBC, CT ABDOMEN PELVIS W WO CONTRAST, CANCELED: CT Abdomen Pelvis W Contrast, CANCELED: CT Abdomen Pelvis Wo Contrast, CANCELED: CT ABDOMEN PELVIS W WO CONTRAST Nausea and vomiting, unspecified vomiting type - Plan: Lipase, CBC, Comprehensive metabolic panel, CT ABDOMEN PELVIS W WO CONTRAST, CANCELED: CT ABDOMEN PELVIS W WO CONTRAST LUQ abdominal pain - Plan: Lipase, CBC, Comprehensive metabolic panel, CT ABDOMEN PELVIS W WO CONTRAST, CANCELED: CT Abdomen Pelvis W Contrast, CANCELED: CT ABDOMEN PELVIS W WO CONTRASTUrinary frequency - Plan: Urine Culture, sulfamethoxazole-trimethoprim (BACTRIM DS) 800-160 MG tablet, CANCELED: CT Abdomen Pelvis W Contrast Nephrolithiasis - Plan: CANCELED: CT Abdomen Pelvis W Contrast Hematuria, unspecified type - Plan: CBC, Urine Culture, sulfamethoxazole-trimethoprim (BACTRIM DS) 800-160 MG tablet, CANCELED: CT Abdomen Pelvis W Contrast  -Left-sided and suprapubic abdominal pain with left flank pain, hematuria on urinalysis, urinary frequency.  History of small nephroliths that was nonobstructive on prior imaging, possible acute nephrolithiasis versus UTI with pyelo-, less likely being afebrile but did have some CVA discomfort.  Nausea/vomiting with left upper quadrant discomfort, differential includes pancreatitis but less likely.  Check lipase, CBC, CMP, CT abdomen pelvis today.  Start Septra DS for possible UTI with urine culture obtained.  ER precautions given.   Further treatment depending on CT results.  Hives Dry skin  -No hives noted on exam, symptomatic care and RTC precautions if those recur.  Pruritus likely due to dry skin and use of heat, fireplace at home.  Hydrating lotion discussed, humidifier, other dry skin care reviewed with RTC precautions.  Need for influenza vaccination - Plan: CANCELED: Flu Vaccine QUAD 6+ mos PF IM (Fluarix Quad PF) Need for Tdap vaccination - Plan: CANCELED: Tdap vaccine greater than or equal to 7yo IM  -Given other acute symptoms at home, deferred immunizations today, plans on nurse visit next week.  Meds ordered this encounter  Medications   sulfamethoxazole-trimethoprim (BACTRIM DS) 800-160 MG tablet    Sig: Take 1 tablet by mouth 2 (two) times daily.    Dispense:  14 tablet    Refill:  0   Patient Instructions  Flank and abdominal  pain could be due to either infection in the urine or possible kidney stone.  Less likely pancreas infection but I will check some other lab work including pancreas test.  I have also ordered a CT scan today to look into this pain further, and to see if the previous kidney stone may be the culprit.  There was some possible sign of infection on the urine test today, I will check a urine culture but start Septra 1 pill twice per day for now.  Okay to continue to drink fluids -water is best. Given your current symptoms I think it is reasonable to delay the vaccines for 1 week and those can be given at a nurse visit. Return to the clinic or go to the nearest emergency room if any of your symptoms worsen or new symptoms occur. We will contact you once we receive some blood tests and CT scan results.  Eucerin, Cetaphil or other lotion multiple times per day for possible dry skin, humidifier in the home may also help with dry skin and itching.  If hives return please be seen, but I do not see any of those currently.  Benadryl could be used temporarily if you do have return of hives, but  again be seen if that occurs.         Signed,   Merri Ray, MD [facility] 01/16/23 1:22 PM

## 2023-01-16 NOTE — Patient Instructions (Addendum)
Flank and abdominal  pain could be due to either infection in the urine or possible kidney stone.  Less likely pancreas infection but I will check some other lab work including pancreas test.  I have also ordered a CT scan today to look into this pain further, and to see if the previous kidney stone may be the culprit.  There was some possible sign of infection on the urine test today, I will check a urine culture but start Septra 1 pill twice per day for now.  Okay to continue to drink fluids -water is best. Given your current symptoms I think it is reasonable to delay the vaccines for 1 week and those can be given at a nurse visit. Return to the clinic or go to the nearest emergency room if any of your symptoms worsen or new symptoms occur. We will contact you once we receive some blood tests and CT scan results.  Eucerin, Cetaphil or other lotion multiple times per day for possible dry skin, humidifier in the home may also help with dry skin and itching.  If hives return please be seen, but I do not see any of those currently.  Benadryl could be used temporarily if you do have return of hives, but again be seen if that occurs.

## 2023-01-17 LAB — URINE CULTURE
MICRO NUMBER:: 14448388
SPECIMEN QUALITY:: ADEQUATE

## 2023-01-19 ENCOUNTER — Ambulatory Visit
Admission: RE | Admit: 2023-01-19 | Discharge: 2023-01-19 | Disposition: A | Discharge status: Home / Self Care | Payer: Commercial Managed Care - HMO | Source: Ambulatory Visit | Attending: Family Medicine | Admitting: Family Medicine

## 2023-01-19 DIAGNOSIS — R1032 Left lower quadrant pain: Secondary | ICD-10-CM

## 2023-01-19 DIAGNOSIS — R109 Unspecified abdominal pain: Secondary | ICD-10-CM

## 2023-01-19 DIAGNOSIS — R1012 Left upper quadrant pain: Secondary | ICD-10-CM

## 2023-01-19 DIAGNOSIS — R112 Nausea with vomiting, unspecified: Secondary | ICD-10-CM

## 2023-01-23 ENCOUNTER — Ambulatory Visit: Payer: Commercial Managed Care - HMO

## 2023-01-29 NOTE — Progress Notes (Signed)
This encounter was created in error - please disregard.

## 2023-03-10 ENCOUNTER — Other Ambulatory Visit: Payer: Self-pay | Admitting: Family Medicine

## 2023-04-20 ENCOUNTER — Ambulatory Visit (INDEPENDENT_AMBULATORY_CARE_PROVIDER_SITE_OTHER): Payer: Self-pay | Admitting: Family Medicine

## 2023-04-20 ENCOUNTER — Telehealth: Payer: Self-pay | Admitting: Family Medicine

## 2023-04-20 ENCOUNTER — Encounter: Payer: Self-pay | Admitting: Family Medicine

## 2023-04-20 VITALS — BP 118/86 | HR 65 | Temp 97.8°F | Ht 69.0 in | Wt 287.6 lb

## 2023-04-20 DIAGNOSIS — K449 Diaphragmatic hernia without obstruction or gangrene: Secondary | ICD-10-CM

## 2023-04-20 DIAGNOSIS — R350 Frequency of micturition: Secondary | ICD-10-CM

## 2023-04-20 DIAGNOSIS — K219 Gastro-esophageal reflux disease without esophagitis: Secondary | ICD-10-CM

## 2023-04-20 DIAGNOSIS — R319 Hematuria, unspecified: Secondary | ICD-10-CM

## 2023-04-20 DIAGNOSIS — R112 Nausea with vomiting, unspecified: Secondary | ICD-10-CM

## 2023-04-20 DIAGNOSIS — R1084 Generalized abdominal pain: Secondary | ICD-10-CM

## 2023-04-20 DIAGNOSIS — R109 Unspecified abdominal pain: Secondary | ICD-10-CM

## 2023-04-20 DIAGNOSIS — N200 Calculus of kidney: Secondary | ICD-10-CM

## 2023-04-20 LAB — POCT URINALYSIS DIPSTICK
Bilirubin, UA: NEGATIVE
Glucose, UA: NEGATIVE
Ketones, UA: NEGATIVE
Leukocytes, UA: NEGATIVE
Nitrite, UA: NEGATIVE
Protein, UA: NEGATIVE
Spec Grav, UA: 1.015 (ref 1.010–1.025)
Urobilinogen, UA: 0.2 E.U./dL
pH, UA: 6.5 (ref 5.0–8.0)

## 2023-04-20 LAB — COMPREHENSIVE METABOLIC PANEL
ALT: 12 U/L (ref 0–35)
AST: 14 U/L (ref 0–37)
Albumin: 3.7 g/dL (ref 3.5–5.2)
Alkaline Phosphatase: 89 U/L (ref 39–117)
BUN: 10 mg/dL (ref 6–23)
CO2: 27 mEq/L (ref 19–32)
Calcium: 8.9 mg/dL (ref 8.4–10.5)
Chloride: 105 mEq/L (ref 96–112)
Creatinine, Ser: 0.66 mg/dL (ref 0.40–1.20)
GFR: 103.41 mL/min (ref >60.00)
Glucose, Bld: 84 mg/dL (ref 70–99)
Potassium: 3.6 mEq/L (ref 3.5–5.1)
Sodium: 139 mEq/L (ref 135–145)
Total Bilirubin: 0.4 mg/dL (ref 0.2–1.2)
Total Protein: 7.5 g/dL (ref 6.0–8.3)

## 2023-04-20 LAB — CBC
HCT: 35.3 % — ABNORMAL LOW (ref 36.0–46.0)
Hemoglobin: 11.6 g/dL — ABNORMAL LOW (ref 12.0–15.0)
MCHC: 32.9 g/dL (ref 30.0–36.0)
MCV: 89.2 fl (ref 78.0–100.0)
Platelets: 310 10*3/uL (ref 150.0–400.0)
RBC: 3.96 Mil/uL (ref 3.87–5.11)
RDW: 13.4 % (ref 11.5–15.5)
WBC: 5.2 10*3/uL (ref 4.0–10.5)

## 2023-04-20 LAB — LIPASE: Lipase: 19 U/L (ref 11.0–59.0)

## 2023-04-20 MED ORDER — CETIRIZINE HCL 10 MG PO TABS: 10.0000 mg | ORAL_TABLET | Freq: Every day | ORAL | 0 refills | Status: DC

## 2023-04-20 MED ORDER — TRAMADOL HCL 50 MG PO TABS
50.0000 mg | ORAL_TABLET | Freq: Three times a day (TID) | ORAL | 0 refills | Status: AC | PRN
Start: 2023-04-20 — End: 2023-04-25

## 2023-04-20 MED ORDER — PANTOPRAZOLE SODIUM 40 MG PO TBEC
40.0000 mg | DELAYED_RELEASE_TABLET | Freq: Two times a day (BID) | ORAL | 3 refills | Status: DC
Start: 2023-04-20 — End: 2023-11-23

## 2023-04-20 MED ORDER — SULFAMETHOXAZOLE-TRIMETHOPRIM 800-160 MG PO TABS
1.0000 | ORAL_TABLET | Freq: Two times a day (BID) | ORAL | 0 refills | Status: DC
Start: 2023-04-20 — End: 2023-07-09

## 2023-04-20 NOTE — Progress Notes (Signed)
Subjective:  Patient ID: Terri Wiley, female    DOB: [birth year]  Age: 47 y.o. MRN: [id number]  CC:  Chief Complaint  Patient presents with   Back Pain    Pt states she is having mid back pain that runs down both hips x3 days.     Urinary Frequency    Pt states frequent urination over the last week denies any burning or pain with urination.  States she is having fluid retention notices stomach is swollen     HPI Terri Wiley presents for   Urinary frequency with back pain As above, frequent urination over the past 1 week, some soreness in mid back on both sides, worse on right. Tmax 99.4. vomiting twice - 2 days ago. No current n/v.  Fatigue. Some soreness in back toward both hips. No leg radiation.  No dysuria.  Gross hematuria 2 times in past week. No dysuria,but urgency, frequency - more at night. Trouble sleeping.  Sore/tender around stomach.  Last BM - this am. Not daily. Normal soft stool.  Tx: pyridium yesterday - some relief. Advil for back pain - BID past 2 days. Min relief.    Similar symptoms in January with flank pain, left abdominal pain, nausea and frequent urination.  History of nephrolithiasis.  At that time urine culture with less than 10,000 CFU of single gram-positive organism, normal lipase, CMP at that time.  Hematuria noted at that time with planned repeat urine testing, has not had that rechecked.  CT abdomen/pelvis on 01/19/2023 without acute findings, no change since study in April 2023.  There was a 3 x 4 mm nonobstructing stone in the lower pole of the left kidney but no passing stone or hydroureteronephrosis.  Large hiatal hernia noted without acute finding and numerous small leiomyomas of the uterus.  Lower lumbar facet osteoarthritis.   Takes protonix for GERD/hiatal hernia. Doing ok Zyrtec working ok for allergies.  LMP over a year ago - menopause.  No hx of seizures.    History Patient Active Problem List   Diagnosis Date Noted   Gastroesophageal  reflux disease 07/04/2022   Atypical chest pain 07/04/2022   Regurgitation of food 07/04/2022   Hiatal hernia 07/04/2022   Chronic constipation 07/04/2022   Colon cancer screening 07/04/2022   Calculus of gallbladder without cholecystitis without obstruction 07/04/2022   Dysfunctional gallbladder 07/04/2022   LLQ pain 07/11/2021   Hot flashes 07/11/2021   Papanicolaou smear of cervix with positive high risk human papilloma virus (HPV) test 06/24/2021   Trichimoniasis 06/24/2021   Menorrhagia with irregular cycle 06/19/2021   Fibroids 06/19/2021   Perimenopause 06/19/2021   Routine Papanicolaou smear 06/19/2021   Chronic left hip pain 02/28/2021   Mass of left hip region 02/28/2021   Past Medical History:  Diagnosis Date   GERD (gastroesophageal reflux disease)    Hernia cerebri    Papanicolaou smear of cervix with positive high risk human papilloma virus (HPV) test 06/24/2021   06/24/21 repeat pap in 1 year per [facility] guidelines    Trichimoniasis 06/24/2021   Treated 06/24/21, POC___________   Past Surgical History:  Procedure Laterality Date   HERNIA REPAIR     tubiligation     Allergies  Allergen Reactions   Bee Venom Shortness Of Breath   Penicillins Hives   Prior to Admission medications   Medication Sig Start Date End Date Taking? Authorizing Provider  acetaminophen (TYLENOL) 325 MG tablet Take 650 mg by mouth every 6 (six) hours as needed. pain  Yes [provider]  aspirin 81 MG EC tablet Take 81 mg by mouth daily. Swallow whole.   Yes [provider]  cetirizine (ZYRTEC) 10 MG tablet Take 1 tablet by mouth once daily 03/11/23  Yes Shade Flood, MD  clindamycin (CLEOCIN) 300 MG capsule Take 1 capsule (300 mg total) by mouth 3 (three) times daily. 07/23/22  Yes Shade Flood, MD  FEROSUL 325 (65 Fe) MG tablet Take 325 mg by mouth daily. 12/02/20  Yes [provider]  furosemide (LASIX) 40 MG tablet Take 1 tablet (40 mg total) by mouth  daily as needed for fluid or edema. 08/08/22  Yes Sheliah Hatch, MD  LORazepam (ATIVAN) 0.5 MG tablet Take 0.5 mg by mouth daily as needed for anxiety. 10/08/20  Yes [provider]  meloxicam (MOBIC) 7.5 MG tablet Take 1 tablet (7.5 mg total) by mouth daily. 07/23/22  Yes Shade Flood, MD  Norethindrone-Ethinyl Estradiol-Fe Biphas (LO LOESTRIN FE) 1 MG-10 MCG / 10 MCG tablet Take 1 tablet by mouth daily. Take 1 daily by mouth 07/11/21  Yes Cyril Mourning A, NP  pantoprazole (PROTONIX) 40 MG tablet Take 1 tablet (40 mg total) by mouth 2 (two) times daily. 07/02/22  Yes Mansouraty, Netty Starring., MD  sulfamethoxazole-trimethoprim (BACTRIM DS) 800-160 MG tablet Take 1 tablet by mouth 2 (two) times daily. 01/16/23  Yes Shade Flood, MD  Throat Lozenges (RA VITAMIN C COUGH DROPS MT) Use as directed 2 lozenges in the mouth or throat daily.   Yes [provider]   Social History   Socioeconomic History   Marital status: Divorced    Spouse name: Not on file   Number of children: Not on file   Years of education: Not on file   Highest education level: Not on file  Occupational History   Not on file  Tobacco Use   Smoking status: Never   Smokeless tobacco: Never  Vaping Use   Vaping Use: Never used  Substance and Sexual Activity   Alcohol use: No   Drug use: No   Sexual activity: Yes    Birth control/protection: Surgical    Comment: tubal  Other Topics Concern   Not on file  Social History Narrative   Not on file   Social Determinants of Health   Financial Resource Strain: Medium Risk (06/19/2021)   Overall Financial Resource Strain (CARDIA)    Difficulty of Paying Living Expenses: Somewhat hard  Food Insecurity: No Food Insecurity (06/19/2021)   Hunger Vital Sign    Worried About Running Out of Food in the Last Year: Never true    Ran Out of Food in the Last Year: Never true  Transportation Needs: No Transportation Needs (06/19/2021)   PRAPARE -  Transportation    Lack of Transportation (Medical): No    Lack of Transportation (Non-Medical): No  Physical Activity: Insufficiently Active (06/19/2021)   Exercise Vital Sign    Days of Exercise per Week: 2 days    Minutes of Exercise per Session: 30 min  Stress: Stress Concern Present (06/19/2021)   [employer] of Occupational Health - Occupational Stress Questionnaire    Feeling of Stress : To some extent  Social Connections: Moderately Isolated (06/19/2021)   Social Connection and Isolation Panel [NHANES]    Frequency of Communication with Friends and Family: Three times a week    Frequency of Social Gatherings with Friends and Family: Once a week    Attends Religious Services: 1 to 4  times per year    Active Member of Clubs or Organizations: No    Attends Banker Meetings: Never    Marital Status: Divorced  Catering manager Violence: Not At Risk (06/19/2021)   Humiliation, Afraid, Rape, and Kick questionnaire    Fear of Current or Ex-Partner: No    Emotionally Abused: No    Physically Abused: No    Sexually Abused: No    Review of Systems Per HPI.   Objective:   Vitals:   04/20/23 1148  BP: 118/86  Pulse: 65  Temp: 97.8 F (36.6 C)  TempSrc: Temporal  SpO2: 99%  Weight: 287 lb 9.6 oz (130.5 kg)  Height: 5\' 9"  (1.753 m)     Physical Exam Vitals reviewed.  Constitutional:      Appearance: Normal appearance. She is well-developed. She is not ill-appearing, toxic-appearing or diaphoretic.  HENT:     Head: Normocephalic and atraumatic.  Eyes:     Conjunctiva/sclera: Conjunctivae normal.     Pupils: Pupils are equal, round, and reactive to light.  Neck:     Vascular: No carotid bruit.  Cardiovascular:     Rate and Rhythm: Normal rate and regular rhythm.     Heart sounds: Normal heart sounds.  Pulmonary:     Effort: Pulmonary effort is normal.     Breath sounds: Normal breath sounds.  Abdominal:     General: There is no distension.      Palpations: Abdomen is soft. There is no pulsatile mass.     Tenderness: There is abdominal tenderness (Diffuse but primarily upper/epigastric greater than lower.  Slight guarding.). There is right CVA tenderness (Bilateral CVA tenderness, but also tender along the paraspinal muscles of the thoracic to upper lumbar spine.  Negative seated straight leg raise.  No rash.  No ecchymosis.) and left CVA tenderness.  Musculoskeletal:     Right lower leg: No edema.     Left lower leg: No edema.     Comments: As above, paraspinal tenderness bilaterally from CVA/mid back to upper aspect of LS spine paraspinals.  No focal bony tenderness midline.  Negative seated straight leg raise.  Skin:    General: Skin is warm and dry.  Neurological:     Mental Status: She is alert and oriented to person, place, and time.  Psychiatric:        Mood and Affect: Mood normal.        Behavior: Behavior normal.     Results for orders placed or performed in visit on 04/20/23  POCT urinalysis dipstick  Result Value Ref Range   Color, UA     Clarity, UA     Glucose, UA Negative Negative   Bilirubin, UA negative    Ketones, UA negative    Spec Grav, UA 1.015 1.010 - 1.025   Blood, UA 1+    pH, UA 6.5 5.0 - 8.0   Protein, UA Negative Negative   Urobilinogen, UA 0.2 0.2 or 1.0 E.U./dL   Nitrite, UA negative    Leukocytes, UA Negative Negative   Appearance     Odor       Assessment & Plan:  Virgin Zellers is a 47 y.o. female . Frequent urination - Plan: POCT urinalysis dipstick, Urine Culture, sulfamethoxazole-trimethoprim (BACTRIM DS) 800-160 MG tablet  Hiatal hernia - Plan: pantoprazole (PROTONIX) 40 MG tablet  Gastroesophageal reflux disease, unspecified whether esophagitis present - Plan: pantoprazole (PROTONIX) 40 MG tablet  Hematuria, unspecified type - Plan: sulfamethoxazole-trimethoprim (BACTRIM DS) 800-160  MG tablet  Generalized abdominal pain - Plan: Comprehensive metabolic panel, CBC, Lipase, CT  RENAL STONE STUDY, traMADol (ULTRAM) 50 MG tablet  Nausea and vomiting, unspecified vomiting type  Nephrolithiasis - Plan: CT RENAL STONE STUDY, traMADol (ULTRAM) 50 MG tablet  Flank pain - Plan: CT RENAL STONE STUDY, traMADol (ULTRAM) 50 MG tablet  1 week of urinary frequency, urgency, few episodes hematuria, few episodes of vomiting and bilateral flank pain.  History of nephrolithiasis.  Although less likely, could be acute nephrolithiasis with possible secondary infection versus component of muscular/paraspinal pain.  Reports low-grade temps at home without true fever and afebrile in office.  -Check lipase, CMP, CBC to evaluate other causes of abdominal pain  -CT urogram/renal stone study to evaluate for acute nephrolithiasis or obstructive nephrolithiasis.  Discussed potential risks of repeat CT but now 1 week into symptoms with worsening pain.  -Hematuria without other apparent signs of infection urine culture but with progressive symptoms we will cover with Septra for now for possible pyelonephritis although less likely, check urine culture.  -Short-term tramadol if needed for pain.  Potential side effects discussed.  -ER precautions given.     Meds ordered this encounter  Medications   cetirizine (ZYRTEC) 10 MG tablet    Sig: Take 1 tablet (10 mg total) by mouth daily.    Dispense:  30 tablet    Refill:  0   pantoprazole (PROTONIX) 40 MG tablet    Sig: Take 1 tablet (40 mg total) by mouth 2 (two) times daily.    Dispense:  60 tablet    Refill:  3   sulfamethoxazole-trimethoprim (BACTRIM DS) 800-160 MG tablet    Sig: Take 1 tablet by mouth 2 (two) times daily.    Dispense:  14 tablet    Refill:  0   traMADol (ULTRAM) 50 MG tablet    Sig: Take 1 tablet (50 mg total) by mouth every 8 (eight) hours as needed for up to 5 days.    Dispense:  15 tablet    Refill:  0   Patient Instructions  Start antibiotic for possible urinary tract infection but urine test in office overall  looked okay except for blood in the urine.  I am checking other tests for pancreas as well as kidney and liver.  Will also schedule a CT to see if you have a kidney stone that is passing.  Okay to use Pyridium over-the-counter for now for the urinary symptoms along with the antibiotic that was prescribed today.  Tramadol as needed for pain.  If you have any worsening symptoms, be seen in the emergency room.  Hang in there.   Abdominal Pain, Adult Pain in the abdomen (abdominal pain) can be caused by many things. Often, abdominal pain is not serious and it gets better with no treatment or by being treated at home. However, sometimes abdominal pain is serious. Your health care provider will ask questions about your medical history and do a physical exam to try to determine the cause of your abdominal pain. Follow these instructions at home: Medicines Take over-the-counter and prescription medicines only as told by your health care provider. Do not take a laxative unless told by your health care provider. General instructions  Watch your condition for any changes. Drink enough fluid to keep your urine pale yellow. Keep all follow-up visits as told by your health care provider. This is important. Contact a health care provider if: Your abdominal pain changes or gets worse. You are not  hungry or you lose weight without trying. You are constipated or have diarrhea for more than 2-3 days. You have pain when you urinate or have a bowel movement. Your abdominal pain wakes you up at night. Your pain gets worse with meals, after eating, or with certain foods. You are vomiting and cannot keep anything down. You have a fever. You have blood in your urine. Get help right away if: Your pain does not go away as soon as your health care provider told you to expect. You cannot stop vomiting. Your pain is only in areas of the abdomen, such as the right side or the left lower portion of the abdomen. Pain on  the right side could be caused by appendicitis. You have bloody or black stools, or stools that look like tar. You have severe pain, cramping, or bloating in your abdomen. You have signs of dehydration, such as: Dark urine, very little urine, or no urine. Cracked lips. Dry mouth. Sunken eyes. Sleepiness. Weakness. You have trouble breathing or chest pain. Summary Often, abdominal pain is not serious and it gets better with no treatment or by being treated at home. However, sometimes abdominal pain is serious. Watch your condition for any changes. Take over-the-counter and prescription medicines only as told by your health care provider. Contact a health care provider if your abdominal pain changes or gets worse. Get help right away if you have severe pain, cramping, or bloating in your abdomen. This information is not intended to replace advice given to you by your health care provider. Make sure you discuss any questions you have with your health care provider. Document Revised: 02/03/2020 Document Reviewed: 04/25/2019 Elsevier Patient Education  2023 Elsevier Inc.  Flank Pain, Adult Flank pain is pain that is located on the side of the body between the upper abdomen and the spine. This area is called the flank. The pain may occur over a short period of time (acute), or it may be long-term or recurring (chronic). It may be mild or severe. Flank pain can be caused by many things, including: Muscle soreness or injury. Kidney infection, kidney stones, or kidney disease. Stress. A disease of the spine (vertebral disk disease). A lung infection (pneumonia). Fluid around the lungs (pulmonary edema). A skin rash caused by the chickenpox virus (shingles). Tumors that affect the back of the abdomen. Gallbladder disease. Follow these instructions at home:  Drink enough fluid to keep your urine pale yellow. Rest as told by your health care provider. Take over-the-counter and prescription  medicines only as told by your health care provider. Keep a journal to track what has caused your flank pain and what has made it feel better. Keep all follow-up visits. This is important. Contact a health care provider if: Your pain is not controlled with medicine. You have new symptoms. Your pain gets worse. Your symptoms last longer than 2-3 days. You have trouble urinating or you are urinating very frequently. Get help right away if: You have trouble breathing or you are short of breath. Your abdomen hurts or it is swollen or red. You have nausea or vomiting. You feel faint, or you faint. You have blood in your urine. You have flank pain and a fever. These symptoms may represent a serious problem that is an emergency. Do not wait to see if the symptoms will go away. Get medical help right away. Call your local emergency services (911 in the [place] Do not drive yourself to the hospital. Summary Flank pain  is pain that is located on the side of the body between the upper abdomen and the spine. The pain may occur over a short period of time (acute), or it may be long-term or recurring (chronic). It may be mild or severe. Flank pain can be caused by many things. Contact your health care provider if your symptoms get worse or last longer than 2-3 days. This information is not intended to replace advice given to you by your health care provider. Make sure you discuss any questions you have with your health care provider. Document Revised: 02/25/2021 Document Reviewed: 02/25/2021 Elsevier Patient Education  2023 Elsevier Inc.       Signed,   Meredith Staggers, MD [facility], [facility] 04/20/23 12:37 PM

## 2023-04-20 NOTE — Patient Instructions (Signed)
Start antibiotic for possible urinary tract infection but urine test in office overall looked okay except for blood in the urine.  I am checking other tests for pancreas as well as kidney and liver.  Will also schedule a CT to see if you have a kidney stone that is passing.  Okay to use Pyridium over-the-counter for now for the urinary symptoms along with the antibiotic that was prescribed today.  Tramadol as needed for pain.  If you have any worsening symptoms, be seen in the emergency room.  Hang in there.   Abdominal Pain, Adult Pain in the abdomen (abdominal pain) can be caused by many things. Often, abdominal pain is not serious and it gets better with no treatment or by being treated at home. However, sometimes abdominal pain is serious. Your health care provider will ask questions about your medical history and do a physical exam to try to determine the cause of your abdominal pain. Follow these instructions at home: Medicines Take over-the-counter and prescription medicines only as told by your health care provider. Do not take a laxative unless told by your health care provider. General instructions  Watch your condition for any changes. Drink enough fluid to keep your urine pale yellow. Keep all follow-up visits as told by your health care provider. This is important. Contact a health care provider if: Your abdominal pain changes or gets worse. You are not hungry or you lose weight without trying. You are constipated or have diarrhea for more than 2-3 days. You have pain when you urinate or have a bowel movement. Your abdominal pain wakes you up at night. Your pain gets worse with meals, after eating, or with certain foods. You are vomiting and cannot keep anything down. You have a fever. You have blood in your urine. Get help right away if: Your pain does not go away as soon as your health care provider told you to expect. You cannot stop vomiting. Your pain is only in areas of  the abdomen, such as the right side or the left lower portion of the abdomen. Pain on the right side could be caused by appendicitis. You have bloody or black stools, or stools that look like tar. You have severe pain, cramping, or bloating in your abdomen. You have signs of dehydration, such as: Dark urine, very little urine, or no urine. Cracked lips. Dry mouth. Sunken eyes. Sleepiness. Weakness. You have trouble breathing or chest pain. Summary Often, abdominal pain is not serious and it gets better with no treatment or by being treated at home. However, sometimes abdominal pain is serious. Watch your condition for any changes. Take over-the-counter and prescription medicines only as told by your health care provider. Contact a health care provider if your abdominal pain changes or gets worse. Get help right away if you have severe pain, cramping, or bloating in your abdomen. This information is not intended to replace advice given to you by your health care provider. Make sure you discuss any questions you have with your health care provider. Document Revised: 02/03/2020 Document Reviewed: 04/25/2019 Elsevier Patient Education  2023 Elsevier Inc.  Flank Pain, Adult Flank pain is pain that is located on the side of the body between the upper abdomen and the spine. This area is called the flank. The pain may occur over a short period of time (acute), or it may be long-term or recurring (chronic). It may be mild or severe. Flank pain can be caused by many things, including: Muscle  soreness or injury. Kidney infection, kidney stones, or kidney disease. Stress. A disease of the spine (vertebral disk disease). A lung infection (pneumonia). Fluid around the lungs (pulmonary edema). A skin rash caused by the chickenpox virus (shingles). Tumors that affect the back of the abdomen. Gallbladder disease. Follow these instructions at home:  Drink enough fluid to keep your urine pale  yellow. Rest as told by your health care provider. Take over-the-counter and prescription medicines only as told by your health care provider. Keep a journal to track what has caused your flank pain and what has made it feel better. Keep all follow-up visits. This is important. Contact a health care provider if: Your pain is not controlled with medicine. You have new symptoms. Your pain gets worse. Your symptoms last longer than 2-3 days. You have trouble urinating or you are urinating very frequently. Get help right away if: You have trouble breathing or you are short of breath. Your abdomen hurts or it is swollen or red. You have nausea or vomiting. You feel faint, or you faint. You have blood in your urine. You have flank pain and a fever. These symptoms may represent a serious problem that is an emergency. Do not wait to see if the symptoms will go away. Get medical help right away. Call your local emergency services (911 in the [place] Do not drive yourself to the hospital. Summary Flank pain is pain that is located on the side of the body between the upper abdomen and the spine. The pain may occur over a short period of time (acute), or it may be long-term or recurring (chronic). It may be mild or severe. Flank pain can be caused by many things. Contact your health care provider if your symptoms get worse or last longer than 2-3 days. This information is not intended to replace advice given to you by your health care provider. Make sure you discuss any questions you have with your health care provider. Document Revised: 02/25/2021 Document Reviewed: 02/25/2021 Elsevier Patient Education  2023 [employer].

## 2023-04-21 ENCOUNTER — Ambulatory Visit
Admission: RE | Admit: 2023-04-21 | Discharge: 2023-04-21 | Disposition: A | Discharge status: Home / Self Care | Payer: Commercial Managed Care - HMO | Source: Ambulatory Visit | Attending: Family Medicine | Admitting: Family Medicine

## 2023-04-21 DIAGNOSIS — R1084 Generalized abdominal pain: Secondary | ICD-10-CM

## 2023-04-21 DIAGNOSIS — N200 Calculus of kidney: Secondary | ICD-10-CM

## 2023-04-21 DIAGNOSIS — R109 Unspecified abdominal pain: Secondary | ICD-10-CM

## 2023-04-21 LAB — URINE CULTURE
MICRO NUMBER:: 14856164
Result:: NO GROWTH
SPECIMEN QUALITY:: ADEQUATE

## 2023-04-23 ENCOUNTER — Encounter: Payer: Self-pay | Admitting: Family Medicine

## 2023-04-24 NOTE — Telephone Encounter (Signed)
Given patients frustration and lack of information from the scans pt is requesting referrals okay to send these or do you have other next steps in mind

## 2023-04-24 NOTE — Telephone Encounter (Signed)
Called patient to discuss options and update.   Feels about the same. Still having abdominal pain. Lower abdomen. No further vomiting.  No recent hematuria.  No measured fever, but feeling low grade temp at times - 99.5, subjective/fever/chills.  Soft stools - no diarrhea or blood in stools.  Pain in low back - moving down to hip and buttock. Pain in back the same.  Has taken tramadol - only took one few nights ago. Some relief, but still frequent urination. No incontinence. Some urgency and frequent urination.  Dysuria component improved. Back and hip pain to left.   No acute findings on CT, urine culture was negative, did not have apparent acute nephrolithiasis on most recent imaging.  She is continuing to have low-grade temps, subjective fever and chills with back pain, hip pain, question discitis or other infection.  She is continuing to have abdominal pain without change from last visit and frequent urination although minimal improvement in dysuria.  Prior negative urine culture, unlikely UTI.  Given her persistent symptoms, I feel it is appropriate she has further evaluation through ER to decide on other imaging, labs or need for  admission.  Understanding of plan expressed.

## 2023-05-23 ENCOUNTER — Other Ambulatory Visit: Payer: Self-pay | Admitting: Family Medicine

## 2023-06-12 ENCOUNTER — Other Ambulatory Visit (HOSPITAL_COMMUNITY): Payer: Self-pay

## 2023-06-12 ENCOUNTER — Telehealth: Payer: Self-pay

## 2023-06-12 NOTE — Telephone Encounter (Signed)
Call [facility] pharmacy and they will have this ready today. Called pt and left vm that the PA was approved and she can pick up today.

## 2023-06-12 NOTE — Telephone Encounter (Signed)
Patient Advocate Encounter  Prior authorization for Pantoprazole Sodium 40MG  dr tablets submitted and APPROVED through [employer].  Test billing returns $1.73 copay for 30 day supply. Quantity 60 per 30.  Key [id number] Effective: 06-12-2023 - 06-11-2024

## 2023-06-22 ENCOUNTER — Telehealth: Payer: Self-pay | Admitting: Gastroenterology

## 2023-06-22 NOTE — Telephone Encounter (Signed)
PT is calling to reschedule EGD but wants to know should she do so being she is having a lot of vomiting and pain now. She is requesting call back to discuss symptoms

## 2023-06-23 ENCOUNTER — Other Ambulatory Visit: Payer: Self-pay | Admitting: Family Medicine

## 2023-06-24 ENCOUNTER — Emergency Department (HOSPITAL_BASED_OUTPATIENT_CLINIC_OR_DEPARTMENT_OTHER): Payer: Commercial Managed Care - HMO

## 2023-06-24 ENCOUNTER — Other Ambulatory Visit: Payer: Self-pay

## 2023-06-24 ENCOUNTER — Emergency Department (HOSPITAL_BASED_OUTPATIENT_CLINIC_OR_DEPARTMENT_OTHER)
Admission: EM | Admit: 2023-06-24 | Discharge: 2023-06-24 | Disposition: A | Discharge status: Home / Self Care | Payer: Commercial Managed Care - HMO | Attending: Emergency Medicine | Admitting: Emergency Medicine

## 2023-06-24 ENCOUNTER — Encounter (HOSPITAL_BASED_OUTPATIENT_CLINIC_OR_DEPARTMENT_OTHER): Payer: Self-pay

## 2023-06-24 ENCOUNTER — Emergency Department (HOSPITAL_BASED_OUTPATIENT_CLINIC_OR_DEPARTMENT_OTHER): Payer: Commercial Managed Care - HMO | Admitting: Radiology

## 2023-06-24 ENCOUNTER — Ambulatory Visit (INDEPENDENT_AMBULATORY_CARE_PROVIDER_SITE_OTHER): Payer: Commercial Managed Care - HMO | Admitting: Family Medicine

## 2023-06-24 VITALS — BP 130/78 | HR 66 | Temp 98.1°F | Ht 69.0 in | Wt 296.0 lb

## 2023-06-24 DIAGNOSIS — R1013 Epigastric pain: Secondary | ICD-10-CM | POA: Insufficient documentation

## 2023-06-24 DIAGNOSIS — M7989 Other specified soft tissue disorders: Secondary | ICD-10-CM | POA: Diagnosis not present

## 2023-06-24 DIAGNOSIS — D649 Anemia, unspecified: Secondary | ICD-10-CM | POA: Insufficient documentation

## 2023-06-24 DIAGNOSIS — R079 Chest pain, unspecified: Secondary | ICD-10-CM

## 2023-06-24 DIAGNOSIS — M25421 Effusion, right elbow: Secondary | ICD-10-CM | POA: Diagnosis not present

## 2023-06-24 DIAGNOSIS — R072 Precordial pain: Secondary | ICD-10-CM | POA: Insufficient documentation

## 2023-06-24 DIAGNOSIS — R0789 Other chest pain: Secondary | ICD-10-CM

## 2023-06-24 DIAGNOSIS — R61 Generalized hyperhidrosis: Secondary | ICD-10-CM | POA: Diagnosis not present

## 2023-06-24 DIAGNOSIS — R1033 Periumbilical pain: Secondary | ICD-10-CM | POA: Diagnosis not present

## 2023-06-24 LAB — CBC WITH DIFFERENTIAL/PLATELET
Abs Immature Granulocytes: 0.01 10*3/uL (ref 0.00–0.07)
Basophils Absolute: 0 10*3/uL (ref 0.0–0.1)
Basophils Relative: 0 %
Eosinophils Absolute: 0.1 10*3/uL (ref 0.0–0.5)
Eosinophils Relative: 2 %
HCT: 33.6 % — ABNORMAL LOW (ref 36.0–46.0)
Hemoglobin: 11.1 g/dL — ABNORMAL LOW (ref 12.0–15.0)
Immature Granulocytes: 0 %
Lymphocytes Relative: 39 %
Lymphs Abs: 2.1 10*3/uL (ref 0.7–4.0)
MCH: 28.8 pg (ref 26.0–34.0)
MCHC: 33 g/dL (ref 30.0–36.0)
MCV: 87 fL (ref 80.0–100.0)
Monocytes Absolute: 0.3 10*3/uL (ref 0.1–1.0)
Monocytes Relative: 6 %
Neutro Abs: 2.8 10*3/uL (ref 1.7–7.7)
Neutrophils Relative %: 53 %
Platelets: 257 10*3/uL (ref 150–400)
RBC: 3.86 MIL/uL — ABNORMAL LOW (ref 3.87–5.11)
RDW: 13.2 % (ref 11.5–15.5)
WBC: 5.4 10*3/uL (ref 4.0–10.5)
nRBC: 0 % (ref 0.0–0.2)

## 2023-06-24 LAB — COMPREHENSIVE METABOLIC PANEL
ALT: 12 U/L (ref 0–44)
AST: 12 U/L — ABNORMAL LOW (ref 15–41)
Albumin: 3.5 g/dL (ref 3.5–5.0)
Alkaline Phosphatase: 93 U/L (ref 38–126)
Anion gap: 9 (ref 5–15)
BUN: 11 mg/dL (ref 6–20)
CO2: 26 mmol/L (ref 22–32)
Calcium: 8.8 mg/dL — ABNORMAL LOW (ref 8.9–10.3)
Chloride: 107 mmol/L (ref 98–111)
Creatinine, Ser: 0.7 mg/dL (ref 0.44–1.00)
GFR, Estimated: >60 mL/min (ref >60)
Glucose, Bld: 95 mg/dL (ref 70–99)
Potassium: 3.3 mmol/L — ABNORMAL LOW (ref 3.5–5.1)
Sodium: 142 mmol/L (ref 135–145)
Total Bilirubin: 0.4 mg/dL (ref 0.3–1.2)
Total Protein: 7.3 g/dL (ref 6.5–8.1)

## 2023-06-24 LAB — URINALYSIS, ROUTINE W REFLEX MICROSCOPIC
Bacteria, UA: NONE SEEN
Bilirubin Urine: NEGATIVE
Glucose, UA: NEGATIVE mg/dL
Ketones, ur: NEGATIVE mg/dL
Leukocytes,Ua: NEGATIVE
Nitrite: NEGATIVE
Protein, ur: NEGATIVE mg/dL
Specific Gravity, Urine: 1.016 (ref 1.005–1.030)
pH: 6 (ref 5.0–8.0)

## 2023-06-24 LAB — TROPONIN I (HIGH SENSITIVITY)
Troponin I (High Sensitivity): 2 ng/L (ref <18)
Troponin I (High Sensitivity): 2 ng/L (ref <18)

## 2023-06-24 LAB — LIPASE, BLOOD: Lipase: 19 U/L (ref 11–51)

## 2023-06-24 MED ORDER — SODIUM CHLORIDE 0.9 % IV BOLUS
500.0000 mL | Freq: Once | INTRAVENOUS | Status: AC
  Administered 2023-06-24: 500 mL via INTRAVENOUS

## 2023-06-24 MED ORDER — IOHEXOL 300 MG/ML  SOLN
100.0000 mL | Freq: Once | INTRAMUSCULAR | Status: AC | PRN
  Administered 2023-06-24: 100 mL via INTRAVENOUS

## 2023-06-24 NOTE — Patient Instructions (Signed)
Please proceed to the emergency room as we discussed after leaving the office today.  If any return of symptoms or new/worsening symptoms pull over and call 911.  Depending on evaluation at the emergency room, they may request follow-up with her primary care provider.  I am not here next week but certainly can see you the following week or with one of my colleagues next week if needed.  Hang in there.

## 2023-06-24 NOTE — ED Triage Notes (Signed)
Patient arrives with complaints of right arm pain, abdominal discomfort/hardness, with chest pressure.   Rates arm pain a 3/10.

## 2023-06-24 NOTE — ED Provider Notes (Signed)
Emergency Department Provider Note   I have reviewed the triage vital signs and the nursing notes.   HISTORY  Chief Complaint Arm Pain, Abdominal Pain, and Chest Pain   HPI Terri Wiley is a 47 y.o. female past history of hiatal hernia, GERD, and family history of ACS presents to the emergency department from her PCP office with multiple complaints including right arm pain/swelling, abdominal discomfort, intermittent squeezing central chest pain.  Symptoms been ongoing for the past 4 days.  She denies any nausea or vomiting.  She will intermittently have squeezing central chest pain without clear provoking factor.  She becomes somewhat diaphoretic.  Symptoms last for several minutes and then resolved.  She has noticed some right arm swelling as well with some mild discomfort.  She has no PICC line or other instrumentation to the arm.  Denies any injury.   Past Medical History:  Diagnosis Date   GERD (gastroesophageal reflux disease)    Hernia cerebri (HCC)    Papanicolaou smear of cervix with positive high risk human papilloma virus (HPV) test 06/24/2021   06/24/21 repeat pap in 1 year per ASCCP guidelines    Trichimoniasis 06/24/2021   Treated 06/24/21, POC___________    Review of Systems  Constitutional: No fever/chills Cardiovascular: Positive chest pain. Respiratory: Denies shortness of breath. Gastrointestinal: Positive abdominal pain with mild nausea.  Musculoskeletal: Negative for back pain. Positive right arm swelling.  Skin: Negative for rash. Neurological: Negative for headaches, focal weakness or numbness.  ____________________________________________   PHYSICAL EXAM:  VITAL SIGNS: ED Triage Vitals  Enc Vitals Group     BP 06/24/23 1615 124/75     Pulse Rate 06/24/23 1615 62     Resp 06/24/23 1615 20     Temp 06/24/23 1615 98.3 F (36.8 C)     Temp Source 06/24/23 1615 Oral     SpO2 06/24/23 1614 96 %     Weight 06/24/23 1614 296 lb 1.2 oz (134.3 kg)      Height 06/24/23 1614 5\' 9"  (1.753 m)   Constitutional: Alert and oriented. Well appearing and in no acute distress. Eyes: Conjunctivae are normal.  Head: Atraumatic. Nose: No congestion/rhinnorhea. Mouth/Throat: Mucous membranes are moist.   Neck: No stridor.  Cardiovascular: Normal rate, regular rhythm. Good peripheral circulation. Grossly normal heart sounds.   Respiratory: Normal respiratory effort.  No retractions. Lungs CTAB. Gastrointestinal: Soft with mild diffuse tenderness. No peritonitis. No distention.  Musculoskeletal: No lower extremity tenderness nor edema. No gross deformities of extremities. Neurologic:  Normal speech and language. No gross focal neurologic deficits are appreciated.  Skin:  Skin is warm, dry and intact. No rash noted.  ____________________________________________   LABS (all labs ordered are listed, but only abnormal results are displayed)  Labs Reviewed  COMPREHENSIVE METABOLIC PANEL - Abnormal; Notable for the following components:      Result Value   Potassium 3.3 (*)    Calcium 8.8 (*)    AST 12 (*)    All other components within normal limits  CBC WITH DIFFERENTIAL/PLATELET - Abnormal; Notable for the following components:   RBC 3.86 (*)    Hemoglobin 11.1 (*)    HCT 33.6 (*)    All other components within normal limits  URINALYSIS, ROUTINE W REFLEX MICROSCOPIC - Abnormal; Notable for the following components:   Hgb urine dipstick TRACE (*)    All other components within normal limits  LIPASE, BLOOD  TROPONIN I (HIGH SENSITIVITY)  TROPONIN I (HIGH SENSITIVITY)   ____________________________________________  EKG   EKG Interpretation Date/Time:  Wednesday June 24 2023 16:17:57 EDT Ventricular Rate:  71 PR Interval:  175 QRS Duration:  100 QT Interval:  505 QTC Calculation: 549 R Axis:   18  Text Interpretation: Sinus rhythm Borderline abnrm T, anterolateral leads Prolonged QT interval Confirmed by Alona Bene [phone]) on  06/24/2023 4:24:44 PM        ____________________________________________  RADIOLOGY  US Venous Img Upper Right (DVT Study)  Result Date: 06/24/2023 CLINICAL DATA:  Chest pain.  Right arm pain and swelling for 4 days EXAM: Right UPPER EXTREMITY VENOUS DOPPLER ULTRASOUND TECHNIQUE: Gray-scale sonography with graded compression, as well as color Doppler and duplex ultrasound were performed to evaluate the upper extremity deep venous system from the level of the subclavian vein and including the jugular, axillary, basilic, radial, ulnar and upper cephalic vein. Spectral Doppler was utilized to evaluate flow at rest and with distal augmentation maneuvers. COMPARISON:  None Available. FINDINGS: Contralateral Subclavian Vein: Respiratory phasicity is normal and symmetric with the symptomatic side. No evidence of thrombus. Normal compressibility. Internal Jugular Vein: No evidence of thrombus. Normal compressibility, respiratory phasicity and response to augmentation. Subclavian Vein: No evidence of thrombus. Normal compressibility, respiratory phasicity and response to augmentation. Axillary Vein: No evidence of thrombus. Normal compressibility, respiratory phasicity and response to augmentation. Cephalic Vein: No evidence of thrombus. Normal compressibility, respiratory phasicity and response to augmentation. Basilic Vein: No evidence of thrombus. Normal compressibility, respiratory phasicity and response to augmentation. Brachial Veins: No evidence of thrombus. Normal compressibility, respiratory phasicity and response to augmentation. Radial Veins: No evidence of thrombus. Normal compressibility, respiratory phasicity and response to augmentation. Ulnar Veins: No evidence of thrombus. Normal compressibility, respiratory phasicity and response to augmentation. Venous Reflux:  None visualized. Other Findings:  None visualized. IMPRESSION: No evidence of right upper extremity DVT Electronically Signed   By:  Karen Kays M.D.   On: 06/24/2023 17:20   DG Chest 2 View  Result Date: 06/24/2023 CLINICAL DATA:  Chest pain EXAM: CHEST - 2 VIEW COMPARISON:  04/17/2022 FINDINGS: The heart size and mediastinal contours are within normal limits. Both lungs are clear. The visualized skeletal structures are unremarkable except for similar degenerative changes and dextroscoliosis of the thoracolumbar spine. Similar large hiatal hernia. Trachea midline. IMPRESSION: No active cardiopulmonary disease. Electronically Signed   By: Judie Petit.  Shick M.D.   On: 06/24/2023 16:52    ____________________________________________   PROCEDURES  Procedure(s) performed:   Procedures  None ____________________________________________   INITIAL IMPRESSION / ASSESSMENT AND PLAN / ED COURSE  Pertinent labs & imaging results that were available during my care of the patient were reviewed by me and considered in my medical decision making (see chart for details).   This patient is Presenting for Evaluation of CP, which does require a range of treatment options, and is a complaint that involves a high risk of morbidity and mortality.  The Differential Diagnoses includes but is not exclusive to acute coronary syndrome, aortic dissection, pulmonary embolism, cardiac tamponade, community-acquired pneumonia, pericarditis, musculoskeletal chest wall pain, etc.   Critical Interventions-    Medications  sodium chloride 0.9 % bolus 500 mL (0 mLs Intravenous Stopped 06/24/23 1745)  iohexol (OMNIPAQUE) 300 MG/ML solution 100 mL (100 mLs Intravenous Contrast Given 06/24/23 1819)    Reassessment after intervention: symptoms improved.   I decided to review pertinent External Data, and in summary patient seen by PCP in office today.   Clinical Laboratory Tests Ordered, included troponin negative. LFTs and  lipase negative. Mild anemia at 11.1. UA without infection.   Radiologic Tests Ordered, included CXR and Venous US. I independently  interpreted the images and agree with radiology interpretation.   Cardiac Monitor Tracing which shows NSR.    Social Determinants of Health Risk patient is a non-smoker.   Medical Decision Making: Summary:  Patient presents to the emergency department with chest discomfort radiating to the jaw at times.  No active chest pain.  Some right arm swelling as well.  Lower suspicion for DVT in the upper extremity without binder instrumentation but will obtain ultrasound.  Abdomen is mildly tender.  She has a history of hiatal hernia which may explain symptoms but will likely need some sort of abdominal imaging.  Plan to reevaluate after labs and troponin to see if chest imaging is required as well. Considered PE but history and vitals are less consistent with this. Considered PE but is PERC negative with low pre-test probability by [facility].   Reevaluation with update and discussion with patient.  Feeling somewhat better after IV fluids.  No DVT on upper extremity ultrasound.  Lab work overall is reassuring.  Troponin normal.  Plan for CT abdomen pelvis and reassess.   Discussed CT finding. Patient stable for d/c. Is already established with Cardiology and GI.   Patient's presentation is most consistent with acute presentation with potential threat to life or bodily function.   Disposition: discharge  ____________________________________________  FINAL CLINICAL IMPRESSION(S) / ED DIAGNOSES  Final diagnoses:  Precordial chest pain  Epigastric pain  Swelling of arm   Note:  This document was prepared using Dragon voice recognition software and may include unintentional dictation errors.  Alona Bene, MD, [facility]    Colsen Modi, Arlyss Repress, MD 06/25/23 [phone]

## 2023-06-24 NOTE — Progress Notes (Addendum)
Subjective:  Patient ID: Terri Wiley, female    DOB: 12-17-1976  Age: 47 y.o. MRN: [id number]  CC:  Chief Complaint  Patient presents with   Hernia    Notes has a hiatal hernia and has been experiencing pain and tightness in her mid abdomen as well as mid chest, OR may be Anxiety sxs pt notes has had increased anxiety since the passing of her son and wanted to overall be checked out    Arm Swelling    Pt note Rt arm is swelling, first noted about 4 days ago pt notes it has swelled further in that time     HPI Dorise Gangi presents for   Chest, upper abdominal pain. History of GERD, hiatal hernia treated with Protonix 40 mg daily when discussed in April.  Other symptoms discussed at that time with normal CMP, lipase, urine culture.  Borderline hemoglobin of 11.6 at that time, down from 12.3 previously.   Has noticed some pain/tightness in her upper abdomen/mid chest recently - past 4 days. Squeezing pain in upper chest. Takes aspirin. Tightness lasts for a few minutes. Slight relief with belching, but still sore.  Some associated jaw pain with chest tightness/pain.  R jaw pain.  Associated sweating, and nausea, and shortness of breath/panting with chest pain/tightness. Not associated to any specific activity and not feeling anxious prior to sx's.  2-3 episodes per day - at rest and activity.  Minimal dry cough. Ongoing, no recent change.  Taking PPI daily. Not associated with food.  No fever.  Appt with cardiology for July 5th.  Son passed in 2022. Denies recent panic/anxiety attacks. Last episode of pain - this morning. Becoming more frequent.   Right arm swelling Initially noticed about 4 days ago. No known injury or prior similar symptoms. Slight soreness, feels heavy, and swollen - mostly upper arm to forearm. No hand swelling. No rash.  No prior similar symptoms.  No injury, use of compression bands or constrictive clothing.  On shoulder injury, min soreness to shoulder. No  axilla rash/swelling.  Feels like getting bigger past few days.  Tx: ibuprofen 2 pills every 4-5 hours. Min change.  No recent prolonged car travel or air travel, no recent new calf pain or swelling in calves.  No melena/hematochezia.  Echo in 2022 - intact EF, no wall motion abnormalities. Dr. Eden Emms. Cards appt 7/5.  No current pain.  No hx of DVT, no recent surgery.   Abd pain: Firm area above umbilicus. Last week. Sore to press on area.,  No N/V/fever. No bowel changes. CT abdomen/pelvis with large hiatal hernia.    History Patient Active Problem List   Diagnosis Date Noted   Gastroesophageal reflux disease 07/04/2022   Atypical chest pain 07/04/2022   Regurgitation of food 07/04/2022   Hiatal hernia 07/04/2022   Chronic constipation 07/04/2022   Colon cancer screening 07/04/2022   Calculus of gallbladder without cholecystitis without obstruction 07/04/2022   Dysfunctional gallbladder 07/04/2022   LLQ pain 07/11/2021   Hot flashes 07/11/2021   Papanicolaou smear of cervix with positive high risk human papilloma virus (HPV) test 06/24/2021   Trichimoniasis 06/24/2021   Menorrhagia with irregular cycle 06/19/2021   Fibroids 06/19/2021   Perimenopause 06/19/2021   Routine Papanicolaou smear 06/19/2021   Chronic left hip pain 02/28/2021   Mass of left hip region 02/28/2021   Past Medical History:  Diagnosis Date   GERD (gastroesophageal reflux disease)    Hernia cerebri (HCC)    Papanicolaou smear  of cervix with positive high risk human papilloma virus (HPV) test 06/24/2021   06/24/21 repeat pap in 1 year per ASCCP guidelines    Trichimoniasis 06/24/2021   Treated 06/24/21, POC___________   Past Surgical History:  Procedure Laterality Date   HERNIA REPAIR     tubiligation     Allergies  Allergen Reactions   Bee Venom Shortness Of Breath   Penicillins Hives   Prior to Admission medications   Medication Sig Start Date End Date Taking? Authorizing Provider   acetaminophen (TYLENOL) 325 MG tablet Take 650 mg by mouth every 6 (six) hours as needed. pain   Yes [provider]  aspirin 81 MG EC tablet Take 81 mg by mouth daily. Swallow whole.   Yes [provider]  cetirizine (ZYRTEC) 10 MG tablet Take 1 tablet by mouth once daily 06/23/23  Yes Shade Flood, MD  clindamycin (CLEOCIN) 300 MG capsule Take 1 capsule (300 mg total) by mouth 3 (three) times daily. 07/23/22  Yes Shade Flood, MD  FEROSUL 325 (65 Fe) MG tablet Take 325 mg by mouth daily. 12/02/20  Yes [provider]  furosemide (LASIX) 40 MG tablet Take 1 tablet (40 mg total) by mouth daily as needed for fluid or edema. 08/08/22  Yes Sheliah Hatch, MD  LORazepam (ATIVAN) 0.5 MG tablet Take 0.5 mg by mouth daily as needed for anxiety. 10/08/20  Yes [provider]  meloxicam (MOBIC) 7.5 MG tablet Take 1 tablet (7.5 mg total) by mouth daily. 07/23/22  Yes Shade Flood, MD  Norethindrone-Ethinyl Estradiol-Fe Biphas (LO LOESTRIN FE) 1 MG-10 MCG / 10 MCG tablet Take 1 tablet by mouth daily. Take 1 daily by mouth 07/11/21  Yes Cyril Mourning A, NP  pantoprazole (PROTONIX) 40 MG tablet Take 1 tablet (40 mg total) by mouth 2 (two) times daily. 04/20/23  Yes Shade Flood, MD  sulfamethoxazole-trimethoprim (BACTRIM DS) 800-160 MG tablet Take 1 tablet by mouth 2 (two) times daily. 04/20/23  Yes Shade Flood, MD  Throat Lozenges (RA VITAMIN C COUGH DROPS MT) Use as directed 2 lozenges in the mouth or throat daily.   Yes [provider]   Social History   Socioeconomic History   Marital status: Divorced    Spouse name: Not on file   Number of children: Not on file   Years of education: Not on file   Highest education level: Bachelor's degree (e.g., BA, AB, BS)  Occupational History   Not on file  Tobacco Use   Smoking status: Never   Smokeless tobacco: Never  Vaping Use   Vaping Use: Never used  Substance and Sexual Activity    Alcohol use: No   Drug use: No   Sexual activity: Yes    Birth control/protection: Surgical    Comment: tubal  Other Topics Concern   Not on file  Social History Narrative   Not on file   Social Determinants of Health   Financial Resource Strain: Low Risk  (06/24/2023)   Overall Financial Resource Strain (CARDIA)    Difficulty of Paying Living Expenses: Not hard at all  Food Insecurity: Unknown (06/24/2023)   Hunger Vital Sign    Worried About Running Out of Food in the Last Year: Not on file    Ran Out of Food in the Last Year: Never true  Transportation Needs: No Transportation Needs (06/24/2023)   PRAPARE - Transportation    Lack of Transportation (Medical): No    Lack of  Transportation (Non-Medical): No  Physical Activity: Unknown (06/24/2023)   Exercise Vital Sign    Days of Exercise per Week: 0 days    Minutes of Exercise per Session: Not on file  Stress: No Stress Concern Present (06/24/2023)   [employer] of Occupational Health - Occupational Stress Questionnaire    Feeling of Stress : Not at all  Social Connections: Moderately Isolated (06/24/2023)   Social Connection and Isolation Panel [NHANES]    Frequency of Communication with Friends and Family: More than three times a week    Frequency of Social Gatherings with Friends and Family: Twice a week    Attends Religious Services: 1 to 4 times per year    Active Member of [employer] or Organizations: No    Attends Banker Meetings: Not on file    Marital Status: Divorced  Intimate Partner Violence: Not At Risk (06/19/2021)   Humiliation, Afraid, Rape, and Kick questionnaire    Fear of Current or Ex-Partner: No    Emotionally Abused: No    Physically Abused: No    Sexually Abused: No    Review of Systems   Objective:   Vitals:   06/24/23 1415  BP: 130/78  Pulse: 66  Temp: 98.1 F (36.7 C)  TempSrc: Temporal  SpO2: 99%  Weight: 296 lb (134.3 kg)  Height: 5\' 9"  (1.753 m)     Physical  Exam Vitals reviewed.  Constitutional:      General: She is not in acute distress.    Appearance: Normal appearance. She is well-developed. She is not ill-appearing, toxic-appearing or diaphoretic.  HENT:     Head: Normocephalic and atraumatic.  Eyes:     Conjunctiva/sclera: Conjunctivae normal.     Pupils: Pupils are equal, round, and reactive to light.  Neck:     Vascular: No carotid bruit.  Cardiovascular:     Rate and Rhythm: Normal rate and regular rhythm.     Heart sounds: Normal heart sounds.  Pulmonary:     Effort: Pulmonary effort is normal.     Breath sounds: Normal breath sounds.  Abdominal:     Palpations: Abdomen is soft. There is no pulsatile mass.     Tenderness: There is abdominal tenderness (With firm area just below umbilicus.  Reproducible tenderness.  Skin intact without erythema.).  Musculoskeletal:     Right lower leg: No edema.     Left lower leg: No edema.     Comments: Right arm, intact range of motion of shoulder and elbow.  Skin intact on arm, no nodular areas or skin lesions.  Soft tissue swelling appreciated with circumference 3 cm greater on the right upper arm compared to left upper arm measured at 15 cm above olecranon.  Possible faint soft tissue swelling difference in forearm of right versus left but no appreciable hand swelling.  Hand veins are flat.  Skin:    General: Skin is warm and dry.  Neurological:     Mental Status: She is alert and oriented to person, place, and time.  Psychiatric:        Mood and Affect: Mood normal.        Behavior: Behavior normal.   EKG, low voltage but no apparent acute changes compared to 04/17/2022.   Assessment & Plan:  Neriyah Cercone is a 47 y.o. female . Chest pain, unspecified type - Plan: EKG 12-Lead  Chest pressure  Swelling of joint of upper arm, right  Periumbilical abdominal pain   4-day history of right  upper arm swelling without known injury, infection or proceeding illness.  During that same  time has had chest pain, episodes of squeezing pain in her upper chest that radiates to her jaw with associated diaphoresis, and nausea.  Concerning for possible unstable angina and reports increased frequency of symptoms.  Without known cause and acute onset of right upper arm swelling, differential includes DVT although less likely with upper extremity.  If this were the case, differential includes less as cause of pain, dyspnea.  Emergency room evaluation recommended today for further testing including likely imaging, other blood work.  Asymptomatic at this time.  Will go by private vehicle with 911 precautions if symptoms recur or worsen and route. Press photographer at [employer] [facility] ER advised pf patient/symptoms.   Firm area of abdomen, abdominal wall pain past 1 week.  Previous CT without apparent umbilical hernia.  No bowel changes, nausea, vomiting.  Uterine leiomyomas noted on January CT pelvis.  Further evaluation through ER as above, including decision on imaging of affected area.  No orders of the defined types were placed in this encounter.  Patient Instructions  Please proceed to the emergency room as we discussed after leaving the office today.  If any return of symptoms or new/worsening symptoms pull over and call 911.  Depending on evaluation at the emergency room, they may request follow-up with her primary care provider.  I am not here next week but certainly can see you the following week or with one of my colleagues next week if needed.  Hang in there.   43 minutes spent during visit, including chart review, counseling and assimilation of information, history of chest pain, arm swelling, abdominal soreness, exam, discussion of plan, and chart completion.  Time exclusive of EKG interpretation.  Signed,   Meredith Staggers, MD [facility], [facility] 06/24/23 3:20 PM

## 2023-06-24 NOTE — Discharge Instructions (Signed)
You are seen in the emergency room today with chest discomfort and abdominal pain.  I suspect this may be coming from your hiatal hernia and would continue your follow-up with the gastroenterology doctor.  Please also continue your follow-up with the cardiologist.  If you develop worsening chest pain despite treatment you should return to the emergency department immediately for reevaluation. Please also coordinate with your PCP regarding your ED evaluation today.

## 2023-06-26 ENCOUNTER — Telehealth: Payer: Self-pay

## 2023-06-26 ENCOUNTER — Other Ambulatory Visit: Payer: Self-pay

## 2023-06-26 MED ORDER — FUROSEMIDE 40 MG PO TABS: 40.0000 mg | ORAL_TABLET | Freq: Every day | ORAL | 1 refills | Status: AC | PRN

## 2023-06-26 NOTE — Telephone Encounter (Signed)
It appears that her ER visit was overall reassuring.  On reviewing her imaging from the ER her chest x-ray did not show any active cardiopulmonary disease, lungs were clear.  No evidence of upper extremity DVT on ultrasound.  Nonobstructive kidney stone on the left, large hiatal hernia with trace fluid at the base of her lungs seen on the CT, but again not seen on x-ray.  Fibroids were seen in the uterus but those have been seen previously.   Agree with eval on Monday for repeat exam and to discuss concerns further.  If she has any new cough, fever, new or worsening chest pain or shortness of breath over the weekend I do recommend she seek care through urgent care or ER.  Let me know if there are further questions.

## 2023-06-26 NOTE — Transitions of Care (Post Inpatient/ED Visit) (Signed)
06/26/2023  Name: Terri Wiley MRN: [id number] DOB: November 06, 1976  Today's TOC FU Call Status: Today's TOC FU Call Status:: Successful TOC FU Call Competed TOC FU Call Complete Date: 06/26/23  Transition Care Management Follow-up Telephone Call Date of Discharge: 06/24/23 Discharge Facility: [facility] ([facility]) Type of Discharge: Emergency Department Reason for ED Visit: Cardiac Conditions Cardiac Conditions Diagnosis: Chest Pain Persisting How have you been since you were released from the hospital?: Same Any questions or concerns?: Yes Patient Questions/Concerns:: Has questions about things she read on her report/labs. Questions the enlarged uterine fibroids, spine curvature in her lower spine and pelvis, fluid retention and noted fluid around lungs. She wants to talk about these things and see if any of them can be addressed because she feels like she's going in circles at this point and just wants to feel better. Patient Questions/Concerns Addressed: Notified Provider of Patient Questions/Concerns  Items Reviewed: Did you receive and understand the discharge instructions provided?: Yes Medications obtained,verified, and reconciled?: Yes (Medications Reviewed) Any new allergies since your discharge?: No Dietary orders reviewed?: NA Do you have support at home?: Yes People in Home: child(ren), adult  Medications Reviewed Today: Medications Reviewed Today     Reviewed by Lenard Simmer, CMA (Certified Medical Assistant) on 06/26/23 at 1342  Med List Status: <None>   Medication Order Taking? Sig Documenting Provider Last Dose Status Informant  acetaminophen (TYLENOL) 325 MG tablet [id number] Yes Take 650 mg by mouth every 6 (six) hours as needed. pain [provider] Taking Active Self, Pharmacy Records  aspirin 81 MG EC tablet [id number] Yes Take 81 mg by mouth daily. Swallow whole. [provider] Taking Active Self, Pharmacy Records  cetirizine (ZYRTEC) 10 MG  tablet [id number] Yes Take 1 tablet by mouth once daily Shade Flood, MD Taking Active   clindamycin (CLEOCIN) 300 MG capsule [id number] Yes Take 1 capsule (300 mg total) by mouth 3 (three) times daily. Shade Flood, MD Taking Active   FEROSUL 325 541 278 8950 Fe) MG tablet [id number] Yes Take 325 mg by mouth daily. [provider] Taking Active Self, Pharmacy Records  furosemide (LASIX) 40 MG tablet [id number] Yes Take 1 tablet (40 mg total) by mouth daily as needed for fluid or edema. Sheliah Hatch, MD Taking Active   LORazepam (ATIVAN) 0.5 MG tablet [id number] Yes Take 0.5 mg by mouth daily as needed for anxiety. [provider] Taking Active Self, Pharmacy Records  meloxicam (MOBIC) 7.5 MG tablet [id number] Yes Take 1 tablet (7.5 mg total) by mouth daily. Shade Flood, MD Taking Active   Norethindrone-Ethinyl Estradiol-Fe Biphas (LO LOESTRIN FE) 1 MG-10 MCG / 10 MCG tablet [id number] Yes Take 1 tablet by mouth daily. Take 1 daily by mouth Adline Potter, NP Taking Active Self, Pharmacy Records  pantoprazole (PROTONIX) 40 MG tablet [id number] Yes Take 1 tablet (40 mg total) by mouth 2 (two) times daily. Shade Flood, MD Taking Active   sulfamethoxazole-trimethoprim (BACTRIM DS) 800-160 MG tablet [id number] Yes Take 1 tablet by mouth 2 (two) times daily. Shade Flood, MD Taking Active   Throat Lozenges (RA VITAMIN C COUGH DROPS MT) [id number] Yes Use as directed 2 lozenges in the mouth or throat daily. [provider] Taking Active Self, Pharmacy Records            Home Care and Equipment/Supplies: Were Home Health Services Ordered?: No Any new equipment or medical supplies ordered?: No  Functional Questionnaire: Do you need assistance with bathing/showering or dressing?: No  Do you need assistance with meal preparation?: No Do you need assistance with eating?: No Do you have difficulty maintaining continence: No Do you need assistance with  getting out of bed/getting out of a chair/moving?: No Do you have difficulty managing or taking your medications?: No  Follow up appointments reviewed: PCP Follow-up appointment confirmed?: Yes Date of PCP follow-up appointment?: 06/29/23 Follow-up Provider: Dr. Beverely Low Specialist [facility] Follow-up appointment confirmed?: NA Do you need transportation to your follow-up appointment?: No Do you understand care options if your condition(s) worsen?: Yes-patient verbalized understanding    SIGNATURE Lenard Simmer

## 2023-06-29 ENCOUNTER — Inpatient Hospital Stay: Payer: Commercial Managed Care - HMO | Admitting: Family Medicine

## 2023-06-29 ENCOUNTER — Telehealth: Payer: Self-pay | Admitting: Family Medicine

## 2023-06-29 NOTE — Telephone Encounter (Signed)
[facility] Night - C Client Site [facility] - Night Contact Type Call Who Is Calling Patient / Member / Family / Caregiver Caller Name Emiliya Kreeger Phone Number [phone] Call Type Message Only Information Provided Reason for Call Returning a Call from the Office Initial Comment Caller states she is returning a call to the office. Disp. Time Disposition Final User 06/28/2023 4:12:55 PM General Information Provided Yes Young Berry Call Closed By: Young Berry Transaction Date/Time: 06/28/2023 4:11:37 PM (ET

## 2023-06-29 NOTE — Telephone Encounter (Signed)
Pt has been scheduled for next Wednesday.

## 2023-07-02 NOTE — Progress Notes (Deleted)
Office Visit    Patient Name: Sarin Verrett Date of Encounter: 07/02/2023  Primary Care Provider:  Shade Flood, MD Primary Cardiologist:  Charlton Haws, MD Primary Electrophysiologist: None   Past Medical History    Past Medical History:  Diagnosis Date   GERD (gastroesophageal reflux disease)    Hernia cerebri (HCC)    Papanicolaou smear of cervix with positive high risk human papilloma virus (HPV) test 06/24/2021   06/24/21 repeat pap in 1 year per ASCCP guidelines    Trichimoniasis 06/24/2021   Treated 06/24/21, POC___________   Past Surgical History:  Procedure Laterality Date   HERNIA REPAIR     tubiligation      Allergies  Allergies  Allergen Reactions   Bee Venom Shortness Of Breath   Penicillins Hives     History of Present Illness    Naika Totzke  is a 47 year old female with a PMH of typical chest pain,  hypertension, obesity, GERD, hiatal hernia, who presents today for 1 year follow-up.  Ms. Archacki was seen initially by Dr. Eden Emms in 01/2021 for complaint of chest pain that she described as going up her left arm with sensation of gas needing to belch.  She underwent a exercise Myoview but was unable to get heart rate adequately elevated which was switched to Lexiscan that showed no significant perfusion defect and was low risk.  She had 2D echo completed that showed normal EF of 55-60% with no RWMA and no significant valvular disease.  She was last seen by Dr. Patient on 06/30/2022 for follow-up.  During visit patient reported pain behind her left knee and swelling and underwent lower extremity Doppler that showed no evidence of DVT.  She was seen in the ED on 06/24/2023 with complaint of right arm pain with abdominal discomfort with chest pressure.  She described pain as intermittent squeezing pain in the central location of the chest.  She had ultrasound of the right upper extremity that showed no evidence of DVT, abdominal CT was also completed that was normal with  no new acute changes of hiatal hernia and troponin were also found to be normal.  Since last being seen in the office patient reports***.  Patient denies chest pain, palpitations, dyspnea, PND, orthopnea, nausea, vomiting, dizziness, syncope, edema, weight gain, or early satiety.   ***Notes: -Recent ED visit for chest pain 06/24/2023 -Patient reported increased anxiety since her son passed out? Sleep study? Home Medications    Current Outpatient Medications  Medication Sig Dispense Refill   acetaminophen (TYLENOL) 325 MG tablet Take 650 mg by mouth every 6 (six) hours as needed. pain     aspirin 81 MG EC tablet Take 81 mg by mouth daily. Swallow whole.     cetirizine (ZYRTEC) 10 MG tablet Take 1 tablet by mouth once daily 30 tablet 0   clindamycin (CLEOCIN) 300 MG capsule Take 1 capsule (300 mg total) by mouth 3 (three) times daily. 21 capsule 0   FEROSUL 325 (65 Fe) MG tablet Take 325 mg by mouth daily.     furosemide (LASIX) 40 MG tablet Take 1 tablet (40 mg total) by mouth daily as needed for fluid or edema. 30 tablet 1   LORazepam (ATIVAN) 0.5 MG tablet Take 0.5 mg by mouth daily as needed for anxiety.     meloxicam (MOBIC) 7.5 MG tablet Take 1 tablet (7.5 mg total) by mouth daily. 30 tablet 0   Norethindrone-Ethinyl Estradiol-Fe Biphas (LO LOESTRIN FE) 1 MG-10 MCG /  10 MCG tablet Take 1 tablet by mouth daily. Take 1 daily by mouth 84 tablet 0   pantoprazole (PROTONIX) 40 MG tablet Take 1 tablet (40 mg total) by mouth 2 (two) times daily. 60 tablet 3   sulfamethoxazole-trimethoprim (BACTRIM DS) 800-160 MG tablet Take 1 tablet by mouth 2 (two) times daily. 14 tablet 0   Throat Lozenges (RA VITAMIN C COUGH DROPS MT) Use as directed 2 lozenges in the mouth or throat daily.     No current facility-administered medications for this visit.     Review of Systems  Please see the history of present illness.    (+)*** (+)***  All other systems reviewed and are otherwise negative except as  noted above.  Physical Exam    Wt Readings from Last 3 Encounters:  06/24/23 296 lb 1.2 oz (134.3 kg)  06/24/23 296 lb (134.3 kg)  04/20/23 287 lb 9.6 oz (130.5 kg)   ZO:XWRUE were no vitals filed for this visit.,There is no height or weight on file to calculate BMI.  Constitutional:      Appearance: Healthy appearance. Not in distress.  Neck:     Vascular: JVD normal.  Pulmonary:     Effort: Pulmonary effort is normal.     Breath sounds: No wheezing. No rales. Diminished in the bases Cardiovascular:     Normal rate. Regular rhythm. Normal S1. Normal S2.      Murmurs: There is no murmur.  Edema:    Peripheral edema absent.  Abdominal:     Palpations: Abdomen is soft non tender. There is no hepatomegaly.  Skin:    General: Skin is warm and dry.  Neurological:     General: No focal deficit present.     Mental Status: Alert and oriented to person, place and time.     Cranial Nerves: Cranial nerves are intact.  EKG/LABS/ Recent Cardiac Studies    ECG personally reviewed by me today - ***   Risk Assessment/Calculations:   {Does this patient have ATRIAL FIBRILLATION?:[phone]}        Lab Results  Component Value Date   WBC 5.4 06/24/2023   HGB 11.1 (L) 06/24/2023   HCT 33.6 (L) 06/24/2023   MCV 87.0 06/24/2023   PLT 257 06/24/2023   Lab Results  Component Value Date   CREATININE 0.70 06/24/2023   BUN 11 06/24/2023   NA 142 06/24/2023   K 3.3 (L) 06/24/2023   CL 107 06/24/2023   CO2 26 06/24/2023   Lab Results  Component Value Date   ALT 12 06/24/2023   AST 12 (L) 06/24/2023   ALKPHOS 93 06/24/2023   BILITOT 0.4 06/24/2023   Lab Results  Component Value Date   CHOL 151 02/27/2022   HDL 48.10 02/27/2022   LDLCALC 94 02/27/2022   TRIG 46.0 02/27/2022   CHOLHDL 3 02/27/2022    Lab Results  Component Value Date   HGBA1C 5.2 05/28/2022     Assessment & Plan    1.  Atypical chest pain: -Patient seen recently in the ED with complaint of chest  pain and today he states***  2.  Essential hypertension: -Patient blood pressure today was*** -Continue***  3.  Obesity: -Patient's BMI is***  4.  Hiatal hernia: -Patient currently treated with Protonix      Disposition: Follow-up with Charlton Haws, MD or APP in *** months {Are you ordering a CV Procedure (e.g. stress test, cath, DCCV, TEE, etc)?   Press F2        :  [id number]}   Medication Adjustments/Labs and Tests Ordered: Current medicines are reviewed at length with the patient today.  Concerns regarding medicines are outlined above.   Signed, Napoleon Form, Leodis Rains, NP 07/02/2023, 10:45 AM [facility]

## 2023-07-03 ENCOUNTER — Ambulatory Visit: Payer: Commercial Managed Care - HMO | Admitting: Nurse Practitioner

## 2023-07-03 DIAGNOSIS — I10 Essential (primary) hypertension: Secondary | ICD-10-CM

## 2023-07-03 DIAGNOSIS — K449 Diaphragmatic hernia without obstruction or gangrene: Secondary | ICD-10-CM

## 2023-07-03 DIAGNOSIS — R0789 Other chest pain: Secondary | ICD-10-CM

## 2023-07-03 DIAGNOSIS — E669 Obesity, unspecified: Secondary | ICD-10-CM

## 2023-07-07 ENCOUNTER — Ambulatory Visit: Payer: Commercial Managed Care - HMO | Attending: Nurse Practitioner | Admitting: Cardiovascular Disease

## 2023-07-07 ENCOUNTER — Other Ambulatory Visit: Payer: Self-pay

## 2023-07-07 ENCOUNTER — Other Ambulatory Visit (HOSPITAL_COMMUNITY)
Admission: RE | Admit: 2023-07-07 | Discharge: 2023-07-07 | Disposition: A | Discharge status: Home / Self Care | Payer: Commercial Managed Care - HMO | Source: Ambulatory Visit | Attending: Cardiovascular Disease | Admitting: Cardiovascular Disease

## 2023-07-07 ENCOUNTER — Ambulatory Visit: Payer: Commercial Managed Care - HMO | Admitting: Cardiovascular Disease

## 2023-07-07 ENCOUNTER — Encounter: Payer: Self-pay | Admitting: Cardiovascular Disease

## 2023-07-07 VITALS — BP 148/96 | HR 75 | Ht 69.5 in | Wt 295.6 lb

## 2023-07-07 DIAGNOSIS — I10 Essential (primary) hypertension: Secondary | ICD-10-CM | POA: Insufficient documentation

## 2023-07-07 DIAGNOSIS — R221 Localized swelling, mass and lump, neck: Secondary | ICD-10-CM | POA: Insufficient documentation

## 2023-07-07 DIAGNOSIS — R609 Edema, unspecified: Secondary | ICD-10-CM | POA: Diagnosis present

## 2023-07-07 DIAGNOSIS — K449 Diaphragmatic hernia without obstruction or gangrene: Secondary | ICD-10-CM | POA: Diagnosis not present

## 2023-07-07 DIAGNOSIS — R079 Chest pain, unspecified: Secondary | ICD-10-CM | POA: Diagnosis not present

## 2023-07-07 LAB — BASIC METABOLIC PANEL
Anion gap: 7 (ref 5–15)
BUN: 11 mg/dL (ref 6–20)
CO2: 28 mmol/L (ref 22–32)
Calcium: 8.5 mg/dL — ABNORMAL LOW (ref 8.9–10.3)
Chloride: 105 mmol/L (ref 98–111)
Creatinine, Ser: 0.78 mg/dL (ref 0.44–1.00)
GFR, Estimated: >60 mL/min (ref >60)
Glucose, Bld: 104 mg/dL — ABNORMAL HIGH (ref 70–99)
Potassium: 3.6 mmol/L (ref 3.5–5.1)
Sodium: 140 mmol/L (ref 135–145)

## 2023-07-07 LAB — CORTISOL: Cortisol, Plasma: 2.7 ug/dL

## 2023-07-07 LAB — TSH: TSH: 1.239 u[IU]/mL (ref 0.350–4.500)

## 2023-07-07 MED ORDER — METOPROLOL TARTRATE 100 MG PO TABS: ORAL_TABLET | ORAL | 0 refills | Status: DC

## 2023-07-07 NOTE — Progress Notes (Signed)
Cardiology Office Note    Date:  07/07/2023   ID:  [name], DOB 1976/04/29, MRN [id number]   PCP:  Shade Flood, MD   [facility]  Cardiologist:  Charlton Haws, MD    History of Present Illness:  Terri Wiley is a 47 y.o. female with history of atypical chest pain seen by initially on 02/2021 2D echo normal LVEF 55 to 60% Lexiscan Myoview low risk study EF 58%. Seen again by PA 11/2021 had atypical pain after her 30 yo son died in 09/24/21  Seen in ED 04/17/22 for atypical pain again  and r/o  ? Related to anxiety with more reflux having run out of protonix and GI cocktail improved   Works as a Office manager at [employer] and walks a lot. Walks on a track twice a week-4-5 laps. Sometimes stops because she gets tired, not because of chest pain. . Sleeps propped up because she gets short of breath otherwise. Drinks a lot of coffee, sweet tea, gatorade, and V8 juices. Eats a lot of canned foods.   2023 has had pain behind left knee with some swelling that her lasix helps with Aching pain not claudication Bilateral venous duplex 07/05/22 with no bakers cyst or DVT   Seen in ED 06/24/23 multiple complaints including right arm pain/swelling, abdominal discomfort, intermittent squeezing central chest pain.  Symptoms been ongoing for the past 4 days.  She denies any nausea or vomiting.  She will intermittently have squeezing central chest pain without clear provoking factor. Troponin negative CT abdomen with large hiatal hernia. Non obstructing left side renal stone [place] with no right UE DVT ECG 06/26/23 SR non specific ST changes nothing acute   Suspect symptoms from hiatal hernia, GERD, reflux On protonix. Shared decision making favor more definitive evaluation of CAD with cardiac CTA. She should f/u with GI and ? General surgery to discuss Rx large hiatal hernia  She has been on lasix as well complains of bilateral UE swelling No recent infection Needs updated  mammogram    Past Medical History:  Diagnosis Date   GERD (gastroesophageal reflux disease)    Hernia cerebri (HCC)    Papanicolaou smear of cervix with positive high risk human papilloma virus (HPV) test 06/24/2021   06/24/21 repeat pap in 1 year per ASCCP guidelines    Trichimoniasis 06/24/2021   Treated 06/24/21, POC___________    Past Surgical History:  Procedure Laterality Date   HERNIA REPAIR     tubiligation      Current Medications: Current Meds  Medication Sig   acetaminophen (TYLENOL) 325 MG tablet Take 650 mg by mouth every 6 (six) hours as needed. pain   aspirin 81 MG EC tablet Take 81 mg by mouth daily. Swallow whole.   cetirizine (ZYRTEC) 10 MG tablet Take 1 tablet by mouth once daily   clindamycin (CLEOCIN) 300 MG capsule Take 1 capsule (300 mg total) by mouth 3 (three) times daily.   FEROSUL 325 (65 Fe) MG tablet Take 325 mg by mouth daily.   furosemide (LASIX) 40 MG tablet Take 1 tablet (40 mg total) by mouth daily as needed for fluid or edema.   LORazepam (ATIVAN) 0.5 MG tablet Take 0.5 mg by mouth daily as needed for anxiety.   meloxicam (MOBIC) 7.5 MG tablet Take 1 tablet (7.5 mg total) by mouth daily.   Norethindrone-Ethinyl Estradiol-Fe Biphas (LO LOESTRIN FE) 1 MG-10 MCG / 10 MCG tablet Take 1 tablet  by mouth daily. Take 1 daily by mouth   pantoprazole (PROTONIX) 40 MG tablet Take 1 tablet (40 mg total) by mouth 2 (two) times daily.   sulfamethoxazole-trimethoprim (BACTRIM DS) 800-160 MG tablet Take 1 tablet by mouth 2 (two) times daily.   Throat Lozenges (RA VITAMIN C COUGH DROPS MT) Use as directed 2 lozenges in the mouth or throat daily.     Allergies:   Bee venom and Penicillins   Social History   Socioeconomic History   Marital status: Divorced    Spouse name: Not on file   Number of children: Not on file   Years of education: Not on file   Highest education level: Bachelor's degree (e.g., BA, AB, BS)  Occupational History   Not on file  Tobacco  Use   Smoking status: Never   Smokeless tobacco: Never  Vaping Use   Vaping Use: Never used  Substance and Sexual Activity   Alcohol use: No   Drug use: No   Sexual activity: Yes    Birth control/protection: Surgical    Comment: tubal  Other Topics Concern   Not on file  Social History Narrative   Not on file   Social Determinants of Health   Financial Resource Strain: Low Risk  (06/24/2023)   Overall Financial Resource Strain (CARDIA)    Difficulty of Paying Living Expenses: Not hard at all  Food Insecurity: Unknown (06/24/2023)   Hunger Vital Sign    Worried About Running Out of Food in the Last Year: Not on file    Ran Out of Food in the Last Year: Never true  Transportation Needs: No Transportation Needs (06/24/2023)   PRAPARE - Administrator, Civil Service (Medical): No    Lack of Transportation (Non-Medical): No  Physical Activity: Unknown (06/24/2023)   Exercise Vital Sign    Days of Exercise per Week: 0 days    Minutes of Exercise per Session: Not on file  Stress: No Stress Concern Present (06/24/2023)   [employer] of Occupational Health - Occupational Stress Questionnaire    Feeling of Stress : Not at all  Social Connections: Moderately Isolated (06/24/2023)   Social Connection and Isolation Panel [NHANES]    Frequency of Communication with Friends and Family: More than three times a week    Frequency of Social Gatherings with Friends and Family: Twice a week    Attends Religious Services: 1 to 4 times per year    Active Member of [employer] or Organizations: No    Attends Engineer, structural: Not on file    Marital Status: Divorced     Family History:  The patient's  family history includes Aneurysm in her father; Diabetes in her mother; Hypertension in her father and mother; Liver disease in her mother.   ROS:   Please see the history of present illness.    ROS All other systems reviewed and are negative.   PHYSICAL EXAM:   VS:  BP  (!) 148/96   Pulse 75   Ht 5' 9.5" (1.765 m)   Wt 295 lb 9.6 oz (134.1 kg)   SpO2 98%   BMI 43.03 kg/m   Affect appropriate Healthy:  appears stated age HEENT: normal no obvious adenopathy  Neck supple with no adenopathy ? Cushingoid like hump on back  JVP normal no bruits no thyromegaly Lungs clear with no wheezing and good diaphragmatic motion Heart:  S1/S2 no murmur, no rub, gallop or click PMI normal Abdomen: benighn, BS  positve, no tenderness, no AAA no bruit.  No HSM or HJR Distal pulses intact with no bruits Mild edema both UEls  Neuro non-focal Skin warm and dry No muscular weakness   Wt Readings from Last 3 Encounters:  07/07/23 295 lb 9.6 oz (134.1 kg)  06/24/23 296 lb 1.2 oz (134.3 kg)  06/24/23 296 lb (134.3 kg)      Studies/Labs Reviewed:   EKG:   04/18/22 SR low voltage non specific ST changes  Recent Labs: 06/24/2023: ALT 12; BUN 11; Creatinine, Ser 0.70; Hemoglobin 11.1; Platelets 257; Potassium 3.3; Sodium 142   Lipid Panel    Component Value Date/Time   CHOL 151 02/27/2022 1025   TRIG 46.0 02/27/2022 1025   HDL 48.10 02/27/2022 1025   CHOLHDL 3 02/27/2022 1025   VLDL 9.2 02/27/2022 1025   LDLCALC 94 02/27/2022 1025    Additional studies/ records that were reviewed today include:  2D echo 3/18/2022IMPRESSIONS     1. Left ventricular ejection fraction, by estimation, is 55 to 60%. The  left ventricle has normal function. The left ventricle has no regional  wall motion abnormalities. Left ventricular diastolic parameters are  indeterminate.   2. Right ventricular systolic function is normal. The right ventricular  size is normal. There is normal pulmonary artery systolic pressure. The  estimated right ventricular systolic pressure is 19.5 mmHg.   3. A small pericardial effusion is present. The pericardial effusion is  posterior to the left ventricle.   4. The mitral valve is grossly normal. Trivial mitral valve  regurgitation.   5. The  aortic valve is tricuspid. Aortic valve regurgitation is not  visualized.   6. The inferior vena cava is normal in size with greater than 50%  respiratory variability, suggesting right atrial pressure of 3 mmHg.    Lexiscan Myoview 03/15/2021 Treadmill attempted but patient unable to achieve adequate heart rate response and there was substantial lead motion artifact making interpretation impossible. Study therefore switched to Lexiscan infusion. No diagnostic ST segment changes were noted. There were occasional to frequent PVCs noted in recovery including couplets. These are upright in the inferior leads suggesting possible outflow tract origin. No sustained arrhythmias. No significant myocardial perfusion defects to indicate scar or ischemia. This is a low risk study. Nuclear stress EF: 58%.      PLAN:  In order of problems listed above:  Atypical chest pain with low risk Lexiscan Myoview 02/2021 normal LVEF on echo. Protonix filled last visit  ? GERD/Reflux, large hiatal hernia causing symptoms improved back on protonix  given recent ER visit will order cardiac CTA for further evaluation of CAD   HTN- driven by stress and high salt diet improved continue diuretic  Suspected sleep apnea-order sleep study per primary   Obestiy-mediterranean diet and exercise discussed  Hiatal Hernia:  large on CT per primary/GI consider general surgery consult Protonix  Edema:  UE;s ? Lymphedema No history of thrombus, central iv, breast cancer. F/U primary for mammogram Will order both soft tissue and venous US of UEls Check random cortisol, TSH  Repeat BMET as K was 3.3 in ER and taking lasix   Cardiac CTA Lopressor 100 mg 2 hours before study Soft tissue/Venous US bilateral UEls BMET/TSH random cortisol  F/U 6 months if cardiac CTA low risk     Signed, Charlton Haws, MD  07/07/2023 2:03 PM    [facility] [address] [place], [place], [place]  [address] Phone: [phone];  Fax: [phone]

## 2023-07-07 NOTE — Patient Instructions (Addendum)
Medication Instructions:  Your physician recommends that you continue on your current medications as directed. Please refer to the Current Medication list given to you today. Take Lopressor 100 mg 2 hours before CT  Labwork: BMET,TSH,Cortisol  Testing/Procedures: Your physician has requested that you have cardiac CT. Cardiac computed tomography (CT) is a painless test that uses an x-ray machine to take clear, detailed pictures of your heart. For further information please visit [email or web address] Please follow instruction sheet as given.   Schedule US soft tissue neck  Schedule upper extremity venous dopplers   Follow-Up: 6 months  Any Other Special Instructions Will Be Listed Below (If Applicable).  If you need a refill on your cardiac medications before your next appointment, please call your pharmacy.

## 2023-07-08 ENCOUNTER — Inpatient Hospital Stay: Payer: Commercial Managed Care - HMO | Admitting: Family Medicine

## 2023-07-09 ENCOUNTER — Ambulatory Visit (INDEPENDENT_AMBULATORY_CARE_PROVIDER_SITE_OTHER): Payer: Commercial Managed Care - HMO | Admitting: Family Medicine

## 2023-07-09 ENCOUNTER — Encounter: Payer: Self-pay | Admitting: Family Medicine

## 2023-07-09 VITALS — BP 122/82 | HR 66 | Temp 97.8°F | Ht 69.0 in | Wt 297.4 lb

## 2023-07-09 DIAGNOSIS — M545 Low back pain, unspecified: Secondary | ICD-10-CM | POA: Insufficient documentation

## 2023-07-09 DIAGNOSIS — K449 Diaphragmatic hernia without obstruction or gangrene: Secondary | ICD-10-CM

## 2023-07-09 DIAGNOSIS — M21619 Bunion of unspecified foot: Secondary | ICD-10-CM | POA: Insufficient documentation

## 2023-07-09 DIAGNOSIS — M5126 Other intervertebral disc displacement, lumbar region: Secondary | ICD-10-CM | POA: Insufficient documentation

## 2023-07-09 DIAGNOSIS — R1084 Generalized abdominal pain: Secondary | ICD-10-CM

## 2023-07-09 DIAGNOSIS — Z1231 Encounter for screening mammogram for malignant neoplasm of breast: Secondary | ICD-10-CM

## 2023-07-09 DIAGNOSIS — E538 Deficiency of other specified B group vitamins: Secondary | ICD-10-CM | POA: Insufficient documentation

## 2023-07-09 DIAGNOSIS — Z13828 Encounter for screening for other musculoskeletal disorder: Secondary | ICD-10-CM | POA: Insufficient documentation

## 2023-07-09 DIAGNOSIS — R011 Cardiac murmur, unspecified: Secondary | ICD-10-CM | POA: Insufficient documentation

## 2023-07-09 NOTE — Patient Instructions (Signed)
-  It was a pleasure to care for you today.  -Placed a referral for a mammogram.  -Placed a referral back to GI for generalized abd pain. -Placed a referral to general surgery for removal of hiatal hernia.  -If you do not hear back about these referrals in the next two weeks by phone call or through MyChart messages, please call the office back.  -Follow up with Dr. Neva Seat in 1 month.

## 2023-07-09 NOTE — Progress Notes (Signed)
Established Patient Office Visit   Subjective:  Patient ID: Terri Wiley, female    DOB: 1976-10-04  Age: 47 y.o. MRN: [id number]  Chief Complaint  Patient presents with   Follow-up    Pt is here today after Er visit- Dr.Greene sent this Pt reports Terri Wiley has hernia in upper chest. Pt reports Terri Wiley need a referral for Gastro-Mammogram. Pt reports Terri Wiley was seen at [facility] for biopsy in past and would like to go back there.     HPI Patient is a 47 year old patient who is requesting a few referrals.   Dr. Trinna Post is her primary care provider. At her last office visit with him on 06/24/2023, Terri Wiley was complaining chest, upper abd pain, right arm swelling, abd pain. Terri Wiley was recommended to go to the emergency department since Terri Wiley was having squeezing chest pain, while having right upper arm swelling, and the pain radiated to her jaw with associated diaphoresis and nausea. Terri Wiley went to [facility] Emergency Department at [facility] on the same day. Terri Wiley was diagnosed with precordial chest pain, epigastric pain, and arm swelling. Found no DVT to right upper extremity via ultrasound. Lab work overall is reassuring, mild anemia. Negative troponion. Negative chest x-ray CT abd pelvis with contrast showed a known large hiatal hernia, no bowl obstruction, normal appendix, non obstructing left sided renal stone, and uterine fibroids. Terri Wiley was recommended to follow up with GI and cardiology.   On 07/07/2023, patient seen [facility] at [facility] with Dr. Charlton Haws for follow up from emergency department and for her chest pain, neck swelling, edema, HTN, and hiatal hernia. He has ordered a CTA for further evaluation of CAD. As for HTN, he thinks it is driven by stress and high salt diet-continue diuretic. Suspect sleep apnea-recommends a sleep study. Hiatal hernia-he recommends to consider general surgery consult for removal and continue Protonix. Edema-recommend a mammogram, he is ordering both  soft tissue and venous US of UEIs and additional labs. Follow up in 6 months.   Terri Wiley is returning today for a referral to general surgery for possible removal of hernia which could be the reason for all the pain, mammogram as cardiology as suggested, and GI for abd pain. Terri Wiley was previously referred to GI and had an appointment, but had to cancel to insurance reasons.  ROS See HPI above     Objective:   BP 122/82   Pulse 66   Temp 97.8 F (36.6 C)   Ht 5\' 9"  (1.753 m)   Wt 297 lb 6 oz (134.9 kg)   SpO2 99%   BMI 43.91 kg/m    Physical Exam Vitals reviewed.  Constitutional:      General: Terri Wiley is not in acute distress.    Appearance: Normal appearance. Terri Wiley is obese. Terri Wiley is not ill-appearing, toxic-appearing or diaphoretic.  Eyes:     General:        Right eye: No discharge.        Left eye: No discharge.     Conjunctiva/sclera: Conjunctivae normal.  Cardiovascular:     Rate and Rhythm: Normal rate and regular rhythm.     Heart sounds: Normal heart sounds. No murmur heard.    No friction rub. No gallop.  Pulmonary:     Effort: Pulmonary effort is normal. No respiratory distress.     Breath sounds: Normal breath sounds.  Abdominal:     General: Bowel sounds are normal. There is no distension.  Palpations: Abdomen is soft.     Tenderness: There is abdominal tenderness (Epigastric and left lower side). There is no guarding.  Musculoskeletal:        General: Tenderness (Left mid to lower back) present. Normal range of motion.  Skin:    General: Skin is warm and dry.  Neurological:     General: No focal deficit present.     Mental Status: Terri Wiley is alert and oriented to person, place, and time. Mental status is at baseline.  Psychiatric:        Mood and Affect: Mood normal.        Behavior: Behavior normal.        Thought Content: Thought content normal.        Judgment: Judgment normal.      Assessment & Plan:  Encounter for screening mammogram for malignant neoplasm of  breast -     3D Screening Mammogram, Left and Right; Future  Hiatal hernia -     Ambulatory referral to General Surgery  Generalized abdominal pain -     Ambulatory referral to Gastroenterology  -Placed a referral for a mammogram.  -Placed a referral back to GI for generalized abd pain. -Placed a referral to general surgery for removal of hiatal hernia.  -Advised if Terri Wiley does not hear back about these referrals in the next two weeks by phone call or through MyChart messages, please call the office back.  -Follow up with Dr. Neva Seat in 1 month for an update in all that Terri Wiley has going on.  -After visit-asked Tonya, CMA to call patient to see if Terri Wiley wanted to do a sleep study also since cardiology recommended.  Return in about 1 month (around 08/09/2023) for follow-up: Dr. Neva Seat .   Zandra Abts, NP

## 2023-07-10 ENCOUNTER — Encounter: Payer: Self-pay | Admitting: Gastroenterology

## 2023-07-10 ENCOUNTER — Telehealth (HOSPITAL_COMMUNITY): Payer: Self-pay | Admitting: Emergency Medicine

## 2023-07-10 ENCOUNTER — Encounter (HOSPITAL_COMMUNITY): Payer: Self-pay

## 2023-07-10 ENCOUNTER — Other Ambulatory Visit: Payer: Self-pay

## 2023-07-10 MED ORDER — SUCRALFATE 1 GM/10ML PO SUSP: 1.0000 g | Freq: Two times a day (BID) | ORAL | 1 refills | Status: DC

## 2023-07-10 NOTE — Telephone Encounter (Signed)
Reaching out to patient to offer assistance regarding upcoming cardiac imaging study; pt verbalizes understanding of appt date/time, parking situation and where to check in, pre-test NPO status and medications ordered, and verified current allergies; name and call back number provided for further questions should they arise Nawaal Alling RN Navigator Cardiac Imaging [facility] [phone] office [phone] cell 

## 2023-07-10 NOTE — Telephone Encounter (Signed)
Patient should not have rescheduled her procedure earlier this year. Only recommendation to consider right now is Carafate twice daily. Schedule her for my next available EGD slot. Thanks. GM

## 2023-07-13 ENCOUNTER — Ambulatory Visit (HOSPITAL_COMMUNITY)
Admission: RE | Admit: 2023-07-13 | Discharge: 2023-07-13 | Disposition: A | Discharge status: Home / Self Care | Payer: Commercial Managed Care - HMO | Source: Ambulatory Visit | Attending: Cardiovascular Disease | Admitting: Cardiovascular Disease

## 2023-07-13 DIAGNOSIS — R072 Precordial pain: Secondary | ICD-10-CM | POA: Diagnosis not present

## 2023-07-13 DIAGNOSIS — R079 Chest pain, unspecified: Secondary | ICD-10-CM | POA: Insufficient documentation

## 2023-07-13 MED ORDER — NITROGLYCERIN 0.4 MG SL SUBL
0.8000 mg | SUBLINGUAL_TABLET | Freq: Once | SUBLINGUAL | Status: AC
  Administered 2023-07-13: 0.8 mg via SUBLINGUAL

## 2023-07-13 MED ORDER — IOHEXOL 350 MG/ML SOLN
100.0000 mL | Freq: Once | INTRAVENOUS | Status: AC | PRN
  Administered 2023-07-13: 100 mL via INTRAVENOUS

## 2023-07-13 MED ORDER — NITROGLYCERIN 0.4 MG SL SUBL
SUBLINGUAL_TABLET | SUBLINGUAL | Status: AC
  Filled 2023-07-13: qty 2

## 2023-07-16 ENCOUNTER — Ambulatory Visit: Payer: Commercial Managed Care - HMO | Attending: Cardiovascular Disease

## 2023-07-16 DIAGNOSIS — R609 Edema, unspecified: Secondary | ICD-10-CM

## 2023-07-16 DIAGNOSIS — R221 Localized swelling, mass and lump, neck: Secondary | ICD-10-CM | POA: Diagnosis not present

## 2023-07-20 ENCOUNTER — Encounter (HOSPITAL_COMMUNITY): Payer: Self-pay

## 2023-07-20 ENCOUNTER — Ambulatory Visit (HOSPITAL_COMMUNITY)
Admission: RE | Admit: 2023-07-20 | Discharge: 2023-07-20 | Disposition: A | Discharge status: Home / Self Care | Payer: Commercial Managed Care - HMO | Source: Ambulatory Visit | Attending: Family Medicine | Admitting: Family Medicine

## 2023-07-20 ENCOUNTER — Ambulatory Visit: Payer: Commercial Managed Care - HMO | Admitting: Physician Assistant

## 2023-07-20 DIAGNOSIS — R221 Localized swelling, mass and lump, neck: Secondary | ICD-10-CM | POA: Insufficient documentation

## 2023-07-20 DIAGNOSIS — Z1231 Encounter for screening mammogram for malignant neoplasm of breast: Secondary | ICD-10-CM | POA: Diagnosis present

## 2023-07-21 ENCOUNTER — Ambulatory Visit (HOSPITAL_COMMUNITY)
Admission: RE | Admit: 2023-07-21 | Discharge: 2023-07-21 | Disposition: A | Discharge status: Home / Self Care | Payer: Commercial Managed Care - HMO | Source: Ambulatory Visit | Attending: Cardiovascular Disease | Admitting: Cardiovascular Disease

## 2023-07-21 DIAGNOSIS — R221 Localized swelling, mass and lump, neck: Secondary | ICD-10-CM | POA: Diagnosis not present

## 2023-07-23 ENCOUNTER — Ambulatory Visit (AMBULATORY_SURGERY_CENTER): Payer: Commercial Managed Care - HMO

## 2023-07-23 VITALS — Ht 69.0 in | Wt 263.0 lb

## 2023-07-23 DIAGNOSIS — K219 Gastro-esophageal reflux disease without esophagitis: Secondary | ICD-10-CM

## 2023-07-23 DIAGNOSIS — K449 Diaphragmatic hernia without obstruction or gangrene: Secondary | ICD-10-CM

## 2023-07-23 DIAGNOSIS — R1110 Vomiting, unspecified: Secondary | ICD-10-CM

## 2023-07-23 DIAGNOSIS — R1013 Epigastric pain: Secondary | ICD-10-CM

## 2023-07-23 NOTE — Progress Notes (Signed)
No egg or soy allergy known to patient  No issues known to pt with past sedation with any surgeries or procedures Patient denies ever being told they had issues or difficulty with intubation  No FH of Malignant Hyperthermia Pt is not on diet pills Pt is not on  home 02  Pt is not on blood thinners  Pt denies issues with constipation  No A fib or A flutter Have any cardiac testing pending-- no  LOA: independent    Patient's chart reviewed by Cathlyn Parsons CNRA prior to previsit and patient appropriate for the [facility].  Previsit completed and red dot placed by patient's name on their procedure day (on provider's schedule).     Prep instructions sent via mychart

## 2023-07-26 ENCOUNTER — Other Ambulatory Visit: Payer: Self-pay | Admitting: Family Medicine

## 2023-08-04 ENCOUNTER — Telehealth: Payer: Commercial Managed Care - HMO | Admitting: Gastroenterology

## 2023-08-04 NOTE — Telephone Encounter (Signed)
I called [employer] regarding egd denial letter to see if I can provide additional details. Per nurse reviewer since the determination came from the physician director the only other way to have ins reconsider is to do an appeal through [employer] directly. It needs to be done by a nurse, provider, np or pa. Please call [employer] at [phone] to appeal decision. I sent them everything we had on this Terri Wiley.  The reason for denial is the following:  Why: We reviewed information from DR. GABRIEL MANSOURATY, your health plan and any policies and guidelines needed to reach this decision. We found the service requested is not medically necessary in your case. This decision was based on the following: Your doctor told us that you have heartburn. We cannot approve this request because: It must be needed for one of the following reasons. -Failed to improve or symptoms returned after an eight week trial of provider directed treatment with a PPI (proton pump inhibitor) taken once daily or four week trial taken twice daily. A PPI is a drug (medication) that helps to reduce stomach acid. -Difficulty swallowing (dysphagia). -Chest pain when swallowing. -You have lost more than five percent of your weight in the past six to 12 months without trying. -Suspected bleeding in the upper GI (gastrointestinal) tract (the upper portion of the path food travels through your body that includes the esophagus, stomach and duodenum). This is based on blood tests, exam, and/or history. -A problem in which your blood lacks enough healthy red blood cells containing iron (iron deficiency anemia) that is suspected to have started in the UGI tract. -Vomiting for at least seven days. -Throwing up blood (hematemesis). -Finding of an abnormal mass, narrowing, or ulcer on UGI imaging.

## 2023-08-04 NOTE — Telephone Encounter (Signed)
Call placed to the insurance company for appeal and was transferred to multiple lines.  I was then transferred and had to leave a message.

## 2023-08-04 NOTE — Telephone Encounter (Signed)
Es I called the number and was told they do not have any information on this pt and have no idea what I need from them.  She gave me a fax number [phone] to send an appeal form with ref # [id number]. I do not have an appeal form and really have no idea what I need to do at this point.  Sorry I tried.

## 2023-08-04 NOTE — Telephone Encounter (Signed)
Mansouraty pt, sending note to Hilma Favors, his RN.

## 2023-08-05 ENCOUNTER — Encounter: Payer: Self-pay | Admitting: Gastroenterology

## 2023-08-05 NOTE — Telephone Encounter (Addendum)
Es I called the numbers you gave me and spoke to several people and have been transferred to several other people with no help.  I was given an episode # [id number] and a case # [id number] as well as another phone number [phone] however this went to the [employer] commission.  I don't know who else to call

## 2023-08-07 ENCOUNTER — Telehealth: Payer: Self-pay | Admitting: Gastroenterology

## 2023-08-07 ENCOUNTER — Encounter: Payer: Commercial Managed Care - HMO | Admitting: Gastroenterology

## 2023-08-07 NOTE — Telephone Encounter (Signed)
In future, she cannot reschedule an EGD without being seen in clinic due to this cancellation/no-show.

## 2023-08-07 NOTE — Telephone Encounter (Signed)
Hi Dr Meridee Score,   I called and left a voice message for patient regarding her appointment, she did not reply and did not arrive. I will no show patient.   Thank you

## 2023-08-10 ENCOUNTER — Ambulatory Visit: Payer: Commercial Managed Care - HMO | Admitting: Family Medicine

## 2023-08-12 ENCOUNTER — Ambulatory Visit (INDEPENDENT_AMBULATORY_CARE_PROVIDER_SITE_OTHER): Payer: Commercial Managed Care - HMO

## 2023-08-12 DIAGNOSIS — Z111 Encounter for screening for respiratory tuberculosis: Secondary | ICD-10-CM | POA: Diagnosis not present

## 2023-08-12 NOTE — Progress Notes (Unsigned)
PPD placed Lt arm, placed 8:25am 08/12/23

## 2023-08-14 ENCOUNTER — Ambulatory Visit: Payer: Commercial Managed Care - HMO

## 2023-08-14 NOTE — Progress Notes (Signed)
Patient presented for reading of the PPD placement which was negative, provided a print out of the PPD as well as negative letter

## 2023-10-07 ENCOUNTER — Ambulatory Visit: Payer: Commercial Managed Care - HMO | Admitting: Gastroenterology

## 2023-10-14 ENCOUNTER — Other Ambulatory Visit: Payer: Self-pay | Admitting: Family Medicine

## 2023-10-15 MED ORDER — CETIRIZINE HCL 10 MG PO TABS: 10.0000 mg | ORAL_TABLET | Freq: Every day | ORAL | 0 refills | Status: DC

## 2023-11-02 ENCOUNTER — Encounter: Payer: Self-pay | Admitting: Family Medicine

## 2023-11-02 ENCOUNTER — Ambulatory Visit: Payer: Managed Care, Other (non HMO) | Admitting: Family Medicine

## 2023-11-02 VITALS — BP 135/83 | HR 68 | Temp 97.1°F | Ht 69.0 in | Wt 302.2 lb

## 2023-11-02 DIAGNOSIS — Z23 Encounter for immunization: Secondary | ICD-10-CM | POA: Diagnosis not present

## 2023-11-02 DIAGNOSIS — R1032 Left lower quadrant pain: Secondary | ICD-10-CM | POA: Diagnosis not present

## 2023-11-02 DIAGNOSIS — R3129 Other microscopic hematuria: Secondary | ICD-10-CM

## 2023-11-02 DIAGNOSIS — R350 Frequency of micturition: Secondary | ICD-10-CM

## 2023-11-02 LAB — POCT URINALYSIS DIPSTICK
Bilirubin, UA: NEGATIVE
Blood, UA: POSITIVE
Glucose, UA: NEGATIVE
Ketones, UA: NEGATIVE
Leukocytes, UA: NEGATIVE
Nitrite, UA: NEGATIVE
Protein, UA: NEGATIVE
Spec Grav, UA: 1.015 (ref 1.010–1.025)
Urobilinogen, UA: 0.2 U/dL
pH, UA: 5 (ref 5.0–8.0)

## 2023-11-02 LAB — POCT URINE PREGNANCY: Preg Test, Ur: NEGATIVE

## 2023-11-02 MED ORDER — CEPHALEXIN 500 MG PO CAPS: 500.0000 mg | ORAL_CAPSULE | Freq: Two times a day (BID) | ORAL | 0 refills | Status: AC

## 2023-11-02 MED ORDER — TAMSULOSIN HCL 0.4 MG PO CAPS: 0.4000 mg | ORAL_CAPSULE | Freq: Every day | ORAL | 0 refills | Status: DC

## 2023-11-02 NOTE — Patient Instructions (Signed)
It was very nice to see you today!  Get urine strainer from [facility]   PLEASE NOTE:  If you had any lab tests please let us know if you have not heard back within a few days. You may see your results on MyChart before we have a chance to review them but we will give you a call once they are reviewed by [place]. If we ordered any referrals today, please let us know if you have not heard from their office within the next week.   Please try these tips to maintain a healthy lifestyle:  Eat most of your calories during the day when you are active. Eliminate processed foods including packaged sweets (pies, cakes, cookies), reduce intake of potatoes, white bread, white pasta, and white rice. Look for whole grain options, oat flour or almond flour.  Each meal should contain half fruits/vegetables, one quarter protein, and one quarter carbs (no bigger than a computer mouse).  Cut down on sweet beverages. This includes juice, soda, and sweet tea. Also watch fruit intake, though this is a healthier sweet option, it still contains natural sugar! Limit to 3 servings daily.  Drink at least 1 glass of water with each meal and aim for at least 8 glasses per day  Exercise at least 150 minutes every week.

## 2023-11-02 NOTE — Progress Notes (Signed)
Subjective:     Patient ID: Terri Wiley, female    DOB: [birth year], 47 y.o.   MRN: [id number]  Chief Complaint  Patient presents with   Back Pain    Pt states that since Friday evening she has had a lower back pain/stomach pain to left side.    Urinary Frequency    Pt states that she has frequent urination since Friday, has not tried anything over the counter, no pain.    HPI Back Pain - States that since Friday evening she's experienced lower left back and abdominal pain that gets worse with urination. Pain rad from front to back.  Not severe  Frequent Urination - Accompanied urinary frequency since Friday having the same onset as other symptoms. Has urinated 6-7 times a night. States she does not feel pain burning or discomfort while urinating, but feels pain in her lower side/back. Has complained of left side abdominal/back pain in the past. Denies vaginal discharge. Has noticed darker urine. Has 4 mm left renal stone in July 2024 on CT. No menses in 2 yrs.  There are no preventive care reminders to display for this patient. Past Medical History:  Diagnosis Date   GERD (gastroesophageal reflux disease)    Hernia cerebri (HCC)    Papanicolaou smear of cervix with positive high risk human papilloma virus (HPV) test 06/24/2021   06/24/21 repeat pap in 1 year per ASCCP guidelines    Trichimoniasis 06/24/2021   Treated 06/24/21, POC___________    Past Surgical History:  Procedure Laterality Date   BREAST BIOPSY Left 06/2022   Fibroadenoma   HERNIA REPAIR     tubiligation       Current Outpatient Medications:    acetaminophen (TYLENOL) 325 MG tablet, Take 650 mg by mouth every 6 (six) hours as needed. pain, Disp: , Rfl:    cephALEXin (KEFLEX) 500 MG capsule, Take 1 capsule (500 mg total) by mouth 2 (two) times daily for 7 days. Take for 7 days, Disp: 14 capsule, Rfl: 0   cetirizine (ZYRTEC) 10 MG tablet, Take 1 tablet (10 mg total) by mouth daily., Disp: 30 tablet, Rfl: 0    FEROSUL 325 (65 Fe) MG tablet, Take 325 mg by mouth daily., Disp: , Rfl:    furosemide (LASIX) 40 MG tablet, Take 1 tablet (40 mg total) by mouth daily as needed for fluid or edema., Disp: 30 tablet, Rfl: 1   LORazepam (ATIVAN) 0.5 MG tablet, Take 0.5 mg by mouth daily as needed for anxiety., Disp: , Rfl:    meloxicam (MOBIC) 7.5 MG tablet, Take 1 tablet (7.5 mg total) by mouth daily., Disp: 30 tablet, Rfl: 0   metoprolol tartrate (LOPRESSOR) 100 MG tablet, Take 100 mg (1 tablet) 2 hours before CT, Disp: 1 tablet, Rfl: 0   naproxen sodium (ALEVE) 220 MG tablet, Take 220 mg by mouth., Disp: , Rfl:    Norethindrone-Ethinyl Estradiol-Fe Biphas (LO LOESTRIN FE) 1 MG-10 MCG / 10 MCG tablet, Take 1 tablet by mouth daily. Take 1 daily by mouth, Disp: 84 tablet, Rfl: 0   pantoprazole (PROTONIX) 40 MG tablet, Take 1 tablet (40 mg total) by mouth 2 (two) times daily., Disp: 60 tablet, Rfl: 3   sucralfate (CARAFATE) 1 GM/10ML suspension, Take 10 mLs (1 g total) by mouth 2 (two) times daily., Disp: 420 mL, Rfl: 1   tamsulosin (FLOMAX) 0.4 MG CAPS capsule, Take 1 capsule (0.4 mg total) by mouth daily., Disp: 30 capsule, Rfl: 0   Throat Lozenges (RA  VITAMIN C COUGH DROPS MT), Use as directed 2 lozenges in the mouth or throat daily., Disp: , Rfl:    aspirin 81 MG EC tablet, Take 81 mg by mouth daily. Swallow whole. (Patient not taking: Reported on 11/02/2023), Disp: , Rfl:   Allergies  Allergen Reactions   Bee Venom Shortness Of Breath   Penicillins Hives   ROS neg/noncontributory except as noted HPI/below      Objective:     BP 135/83   Pulse 68   Temp (!) 97.1 F (36.2 C)   Ht 5\' 9"  (1.753 m)   Wt (!) 302 lb 3.2 oz (137.1 kg)   SpO2 96%   BMI 44.63 kg/m  Wt Readings from Last 3 Encounters:  11/02/23 (!) 302 lb 3.2 oz (137.1 kg)  07/23/23 263 lb (119.3 kg)  07/09/23 297 lb 6 oz (134.9 kg)    Physical Exam   Gen: WDWN NAD HEENT: NCAT, conjunctiva not injected, sclera nonicteric NECK:   supple, no thyromegaly, no nodes, no carotid bruits CARDIAC: RRR, S1S2+, no murmur. DP 2+B LUNGS: CTAB. No wheezes ABDOMEN:  BS+, soft, No HSM, no masses +tender left lower and middle quadrant, supra pubic.  No CVAT EXT:  no edema MSK: no gross abnormalities.  NEURO: A&O x3.  CN II-XII intact.  PSYCH: normal mood. Good eye contact Skin: no rash  Results for orders placed or performed in visit on 11/02/23  Pregnancy, urine  Result Value Ref Range   Negative    POCT Urinalysis Dipstick  Result Value Ref Range   Color, UA Yellow    Clarity, UA clear    Glucose, UA Negative Negative   Bilirubin, UA Negative    Ketones, UA Negative    Spec Grav, UA 1.015 1.010 - 1.025   Blood, UA Positive (U)    pH, UA 5.0 5.0 - 8.0   Protein, UA Negative Negative   Urobilinogen, UA 0.2 0.2 or 1.0 E.U./dL   Nitrite, UA Negative    Leukocytes, UA Negative Negative   Appearance     Odor        Assessment & Plan:  LLQ pain -     Urine Culture  Frequent urination -     POCT urinalysis dipstick -     Pregnancy, urine -     Urine Culture -     POCT urine pregnancy  Microscopic hematuria -     Urine Culture  Need for influenza vaccination -     Flu vaccine trivalent PF, 6mos and older(Flulaval,Afluria,Fluarix,Fluzone)  Left lower quadrant abdominal pain -     CT ABDOMEN PELVIS WO CONTRAST; Future  Other orders -     Tamsulosin HCl; Take 1 capsule (0.4 mg total) by mouth daily.  Dispense: 30 capsule; Refill: 0 -     Cephalexin; Take 1 capsule (500 mg total) by mouth 2 (two) times daily for 7 days. Take for 7 days  Dispense: 14 capsule; Refill: 0  1  LLQ pain w/micro hematuria-has known 4mm stone on L  not sure if stone moving or incidental  ?pyelo(no wbc in urine), ?UTI, ?shingles, diverticulitis, mass, other.  Will check CT abd for stone/blockage  will do keflex 500bid(awaid cx), tamsulosin 0.4mg   worse, ER.    Return if symptoms worsen or fail to improve.  Germaine Pomfret Rice,acting as a  scribe for Angelena Sole, MD.,have documented all relevant documentation on the behalf of Angelena Sole, MD,as directed by  Angelena Sole, MD while  in the presence of Angelena Sole, MD.  I, Angelena Sole, MD, have reviewed all documentation for this visit. The documentation on 11/02/23 for the exam, diagnosis, procedures, and orders are all accurate and complete.   Angelena Sole, MD

## 2023-11-03 ENCOUNTER — Ambulatory Visit (HOSPITAL_COMMUNITY): Payer: Managed Care, Other (non HMO)

## 2023-11-03 LAB — URINE CULTURE
MICRO NUMBER:: 15682019
Result:: NO GROWTH
SPECIMEN QUALITY:: ADEQUATE

## 2023-11-04 ENCOUNTER — Ambulatory Visit: Payer: Commercial Managed Care - HMO | Admitting: Gastroenterology

## 2023-11-04 NOTE — Progress Notes (Signed)
No urine infection.  I see she canceled the CT scan.  How is she doing?  Can stop antibiotics

## 2023-11-06 ENCOUNTER — Encounter: Payer: Self-pay | Admitting: *Deleted

## 2023-11-06 NOTE — Telephone Encounter (Signed)
Patient contacted and given lab results and recommendations. Patient stated understanding and did not have any questions or concerns at this time.  ? ?

## 2023-11-06 NOTE — Telephone Encounter (Signed)
Patient returned call. Requests to be called. 

## 2023-11-10 ENCOUNTER — Other Ambulatory Visit: Payer: Self-pay | Admitting: Family Medicine

## 2023-11-11 ENCOUNTER — Telehealth: Payer: Self-pay | Admitting: Family Medicine

## 2023-11-11 ENCOUNTER — Other Ambulatory Visit: Payer: Self-pay

## 2023-11-11 MED ORDER — CETIRIZINE HCL 10 MG PO TABS: 10.0000 mg | ORAL_TABLET | Freq: Every day | ORAL | 0 refills | Status: DC

## 2023-11-11 NOTE — Telephone Encounter (Signed)
Pt has been notified.

## 2023-11-11 NOTE — Telephone Encounter (Signed)
Encourage patient to contact the pharmacy for refills or they can request refills through [facility]  (Please schedule appointment if patient has not been seen in over a year)    WHAT PHARMACY WOULD THEY LIKE THIS SENT TO: [id number] - [place], [place] - [address]   MEDICATION NAME & DOSE: cetirizine (ZYRTEC) 10 MG tablet   NOTES/COMMENTS FROM PATIENT:      Front office please notify patient: It takes 48-72 hours to process rx refill requests Ask patient to call pharmacy to ensure rx is ready before heading there.

## 2023-11-11 NOTE — Telephone Encounter (Signed)
Refill has been sent to pharmacy on file.  

## 2023-11-22 ENCOUNTER — Other Ambulatory Visit: Payer: Self-pay | Admitting: Family Medicine

## 2023-11-22 DIAGNOSIS — K219 Gastro-esophageal reflux disease without esophagitis: Secondary | ICD-10-CM

## 2023-11-22 DIAGNOSIS — K449 Diaphragmatic hernia without obstruction or gangrene: Secondary | ICD-10-CM

## 2023-12-24 NOTE — Telephone Encounter (Signed)
error 

## 2023-12-28 ENCOUNTER — Other Ambulatory Visit: Payer: Self-pay | Admitting: Family Medicine

## 2023-12-28 DIAGNOSIS — K219 Gastro-esophageal reflux disease without esophagitis: Secondary | ICD-10-CM

## 2023-12-28 DIAGNOSIS — K449 Diaphragmatic hernia without obstruction or gangrene: Secondary | ICD-10-CM

## 2023-12-29 ENCOUNTER — Other Ambulatory Visit: Payer: Self-pay | Admitting: Family Medicine

## 2024-01-25 ENCOUNTER — Other Ambulatory Visit: Payer: Self-pay | Admitting: Family Medicine

## 2024-01-25 DIAGNOSIS — K449 Diaphragmatic hernia without obstruction or gangrene: Secondary | ICD-10-CM

## 2024-01-25 DIAGNOSIS — K219 Gastro-esophageal reflux disease without esophagitis: Secondary | ICD-10-CM

## 2024-01-26 ENCOUNTER — Other Ambulatory Visit: Payer: Self-pay | Admitting: Family Medicine

## 2024-01-26 DIAGNOSIS — K449 Diaphragmatic hernia without obstruction or gangrene: Secondary | ICD-10-CM

## 2024-01-26 DIAGNOSIS — K219 Gastro-esophageal reflux disease without esophagitis: Secondary | ICD-10-CM

## 2024-01-27 ENCOUNTER — Ambulatory Visit: Payer: Commercial Managed Care - HMO | Admitting: Gastroenterology

## 2024-01-27 MED ORDER — CETIRIZINE HCL 10 MG PO TABS: 10.0000 mg | ORAL_TABLET | Freq: Every day | ORAL | 0 refills | Status: DC

## 2024-02-23 ENCOUNTER — Ambulatory Visit (INDEPENDENT_AMBULATORY_CARE_PROVIDER_SITE_OTHER): Payer: BC Managed Care – PPO | Admitting: Gastroenterology

## 2024-02-23 ENCOUNTER — Encounter: Payer: Self-pay | Admitting: Gastroenterology

## 2024-02-23 VITALS — BP 106/74 | HR 92 | Ht 67.0 in | Wt 305.5 lb

## 2024-02-23 DIAGNOSIS — Z789 Other specified health status: Secondary | ICD-10-CM | POA: Insufficient documentation

## 2024-02-23 DIAGNOSIS — K219 Gastro-esophageal reflux disease without esophagitis: Secondary | ICD-10-CM

## 2024-02-23 DIAGNOSIS — Z6841 Body Mass Index (BMI) 40.0 and over, adult: Secondary | ICD-10-CM

## 2024-02-23 DIAGNOSIS — R0789 Other chest pain: Secondary | ICD-10-CM | POA: Diagnosis not present

## 2024-02-23 DIAGNOSIS — K449 Diaphragmatic hernia without obstruction or gangrene: Secondary | ICD-10-CM | POA: Diagnosis not present

## 2024-02-23 MED ORDER — DEXLANSOPRAZOLE 30 MG PO CPDR: 30.0000 mg | DELAYED_RELEASE_CAPSULE | Freq: Two times a day (BID) | ORAL | 2 refills | Status: DC

## 2024-02-23 NOTE — Progress Notes (Signed)
 GASTROENTEROLOGY OUTPATIENT CLINIC VISIT   Primary Care Provider Shade Flood, MD [address] [place] [address] [phone]  Patient Profile: Terri Wiley is a 48 y.o. female with a pmh significant for arthritis, fibromyalgia, hypertension, morbid obesity, nephrolithiasis, GERD, hiatal hernia, chronic constipation.  The patient presents to the [facility] for an evaluation and management of problem(s) noted below:  Problem List 1. Gastroesophageal reflux disease, unspecified whether esophagitis present   2. Hiatal hernia   3. Morbid obesity (HCC)   4. Non-cardiac chest pain   5. Advised about management of weight    Discussed the use of AI scribe software for clinical note transcription with the patient, who gave verbal consent to proceed.  History of Present Illness Please see prior GI notes for full details of HPI.  Interval History This patient returns for follow-up.  We have not seen her in the last few years.  Due to changes in insurance, she was not able to proceed with EGD or follow-up that had been recommended in the past.  More recently, she has been seen by her PCP as well as her cardiologist.  She had also seen in 2024 a bariatric surgeon to discuss role of hiatal hernia repair, but they also discussed consideration of bariatric surgery for longer lasting relief attempt and for overall health benefits.  She is hesitant about bariatric surgery.  She is interested however in the hiatal hernia being repaired.  She is experiencing episodes of difficulty breathing and non-cardiac chest pain.  She has times of pyrosis as well as regurgitation.  She tries to eat small portions.  She takes her PPI twice daily (but has been on Protonix for years at this point).  At night, she gets worsened symptoms as well that can wake her up.  She has not undergone an EGD recently.  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   She gets up early for work and often skips breakfast, opting for a cup of coffee or water instead. Her lunch typically consists of a sandwich, and she eats small portions for dinner to avoid symptoms.  She will take her PM dosing of PPI before or sometimes after dinner.  She denies any dysphagia symptoms.  She does not have diabetes.  She has never been on a GLP analogue.   GI Review of Systems Positive as above Negative for dysphagia, odynophagia, melena, hematochezia  Review of Systems General: Denies fevers/chills/weight loss unintentionally Cardiovascular: Denies current chest pain/palpitations Pulmonary: Denies shortness of breath Gastroenterological: See HPI Genitourinary: Denies darkened urine Hematological: Denies easy bruising/bleeding Dermatological: Denies jaundice Psychological: Mood is stable   Medications Current Outpatient Medications  Medication Sig Dispense Refill   acetaminophen (TYLENOL) 325 MG tablet Take 650 mg by mouth as needed. pain     aspirin 81 MG EC tablet Take 81 mg by mouth daily. Swallow whole.     cetirizine (ZYRTEC) 10 MG tablet Take 1 tablet (10 mg total) by mouth daily. 30 tablet 0   Dexlansoprazole 30 MG capsule DR Take 1 capsule (30 mg total) by mouth 2 (two) times daily. 60 capsule 2   FEROSUL 325 (65 Fe) MG tablet Take 325 mg by mouth daily.     furosemide (LASIX) 40 MG tablet Take 1 tablet (40 mg total) by mouth daily as needed for fluid or edema. 30 tablet 1   meloxicam (MOBIC) 7.5 MG tablet Take 1 tablet (7.5 mg  total) by mouth daily. (Patient taking differently: Take 7.5 mg by mouth as needed.) 30 tablet 0   naproxen sodium (ALEVE) 220 MG tablet Take 220 mg by mouth as needed.     Throat Lozenges (RA VITAMIN C COUGH DROPS MT) Use as directed 2 lozenges in the mouth or throat daily.     No current facility-administered medications for this visit.    Allergies Allergies  Allergen Reactions   Bee Venom Shortness Of Breath   Penicillins  Hives   Hydrocodone Hives    Histories Past Medical History:  Diagnosis Date   GERD (gastroesophageal reflux disease)    Hernia cerebri (HCC)    Papanicolaou smear of cervix with positive high risk human papilloma virus (HPV) test 06/24/2021   06/24/21 repeat pap in 1 year per ASCCP guidelines    Trichimoniasis 06/24/2021   Treated 06/24/21, POC___________   Past Surgical History:  Procedure Laterality Date   BREAST BIOPSY Left 06/2022   Fibroadenoma   HERNIA REPAIR     tubiligation     Social History   Socioeconomic History   Marital status: Divorced    Spouse name: Not on file   Number of children: Not on file   Years of education: Not on file   Highest education level: Bachelor's degree (e.g., BA, AB, BS)  Occupational History   Not on file  Tobacco Use   Smoking status: Never   Smokeless tobacco: Never  Vaping Use   Vaping status: Never Used  Substance and Sexual Activity   Alcohol use: No   Drug use: No   Sexual activity: Yes    Birth control/protection: Surgical    Comment: tubal  Other Topics Concern   Not on file  Social History Narrative   Not on file   Social Drivers of Health   Financial Resource Strain: Low Risk  (06/24/2023)   Overall Financial Resource Strain (CARDIA)    Difficulty of Paying Living Expenses: Not hard at all  Food Insecurity: Unknown (06/24/2023)   Hunger Vital Sign    Worried About Running Out of Food in the Last Year: Not on file    Ran Out of Food in the Last Year: Never true  Transportation Needs: No Transportation Needs (06/24/2023)   PRAPARE - Administrator, Civil Service (Medical): No    Lack of Transportation (Non-Medical): No  Physical Activity: Unknown (06/24/2023)   Exercise Vital Sign    Days of Exercise per Week: 0 days    Minutes of Exercise per Session: Not on file  Stress: No Stress Concern Present (06/24/2023)   [employer] of Occupational Health - Occupational Stress Questionnaire    Feeling of  Stress : Not at all  Social Connections: Moderately Isolated (06/24/2023)   Social Connection and Isolation Panel [NHANES]    Frequency of Communication with Friends and Family: More than three times a week    Frequency of Social Gatherings with Friends and Family: Twice a week    Attends Religious Services: 1 to 4 times per year    Active Member of [employer] or Organizations: No    Attends Banker Meetings: Not on file    Marital Status: Divorced  Intimate Partner Violence: Unknown (04/03/2022)   Received from [employer], [facility]   HITS    Physically Hurt: Not on file    Insult or Talk Down To: Not on file    Threaten Physical Harm: Not on file    Scream or  Curse: Not on file   Family History  Problem Relation Age of Onset   Liver disease Mother    Hypertension Mother    Diabetes Mother    Aneurysm Father    Hypertension Father    Colon cancer Neg Hx    Stomach cancer Neg Hx    Esophageal cancer Neg Hx    Inflammatory bowel disease Neg Hx    Pancreatic cancer Neg Hx    Rectal cancer Neg Hx    I have reviewed her medical, social, and family history in detail and updated the electronic medical record as necessary.    PHYSICAL EXAMINATION  BP 106/74 (BP Location: Left Arm, Patient Position: Sitting, Cuff Size: Large)   Pulse 92   Ht 5\' 7"  (1.702 m) Comment: height measured without shoes  Wt (!) 305 lb 8 oz (138.6 kg)   BMI 47.85 kg/m  Wt Readings from Last 3 Encounters:  02/23/24 (!) 305 lb 8 oz (138.6 kg)  11/02/23 (!) 302 lb 3.2 oz (137.1 kg)  07/23/23 263 lb (119.3 kg)  GEN: NAD, appears stated age, doesn't appear chronically ill PSYCH: Cooperative, without pressured speech EYE: Conjunctivae pink, sclerae anicteric ENT: MMM CV: Nontachycardic RESP: No audible wheezing GI: NABS, soft, protuberant abdomen, rounded, NT/ND, without rebound or guarding MSK/EXT: Trace bilateral pedal edema SKIN: No jaundice NEURO:  Alert & Oriented x 3, no focal  deficits   REVIEW OF DATA  I reviewed the following data at the time of this encounter:  GI Procedures and Studies  No relevant studies to review  Laboratory Studies  Reviewed those in epic and care everywhere  Imaging Studies  June 2024 CTAP with contrast IMPRESSION: No bowel obstruction, free air or free fluid. Scattered colonic stool. Normal appendix. Nonobstructing left-sided renal stone. Large hiatal hernia.  Trace pleural fluid. Uterine fibroids    ASSESSMENT  Ms. Gilkerson is a 48 y.o. female with a pmh significant for arthritis, fibromyalgia, hypertension, morbid obesity, nephrolithiasis, GERD, hiatal hernia, chronic constipation.  The patient is seen today for evaluation and management of:  1. Gastroesophageal reflux disease, unspecified whether esophagitis present   2. Hiatal hernia   3. Morbid obesity (HCC)   4. Non-cardiac chest pain   5. Advised about management of weight    The patient is hemodynamically stable.  Clinically, I think the majority of her symptomatology is likely a result of her hiatal hernia and underlying GERD.  As she has had increasing weight over the course of the last few years, I do not think that this is helpful either.  She had a discussion with bariatric surgery to consider hiatal hernia repair and after thoughtful evaluation, was felt that she may be better suited for longer-term improvement by having hiatal hernia repaired as well as a Roux-en-Y gastric bypass.  She is not clearly ready to consider gastric bypass intervention and would like the hiatal hernia to just be repaired itself.  I discussed that at times hiatal hernias can be repaired solely but in many patients if there are other indications that may decrease the risk for other health issues in the future the consideration of bariatric surgery in addition to hiatal hernia repair is considered and may be a reason why the surgeons ended up discussing that with her.  With this being said, we  are going to move forward with continued PPI usage and initiation of a different PPI.  She may also consider Pepcid use in the future if needed.  Will go  to move forward with an upper endoscopy to better define her hiatal hernia.  Will going to set her up for evaluation by healthy weight clinic through [facility].  We did discuss that GLP analogs that can be helpful for individuals may exacerbate her particular symptomatology as a result of her large hiatal hernia and her GERD symptoms but that may be something to consider.  GERD lifestyle modifications were discussed.  We will see how she does.  If we have to set her up for a follow-up with surgery or with another surgery group we can certainly do that if needed.  The risks and benefits of endoscopic evaluation were discussed with the patient; these include but are not limited to the risk of perforation, infection, bleeding, missed lesions, lack of diagnosis, severe illness requiring hospitalization, as well as anesthesia and sedation related illnesses.  The patient and/or family is agreeable to proceed.  All patient questions were answered to the best of my ability, and the patient agrees to the aforementioned plan of action with follow-up as indicated.   PLAN  Switch to Dexilant 30 mg twice daily May use Pepcid 20 mg at bedtime and another dose during day if needed Diagnostic endoscopy to be scheduled with biopsies GERD Lifestyle modifications rediscussed -Eat meals >3 hours before bedtime -Do not lay down or lie flat immediately after eating for at least 2-hours -Do not overeat (decrease portion size and/or eat more frequent smaller meals) -Eat slowly -Wear Loose-fitting clothes -Raise Head of Bed (Head & Chest > Feet with bed blocks) -Avoid foods that trigger the symptoms (Onions, Chocolate, Caffeine, Spicy, and Fatty) -Work on Losing Weight will be key -Avoid tobacco Use -Avoid Alcohol -Increase Exercise Activity -Maintain Heartburn  Diary/Log Colon cancer screening surveillance will be due in 2026 with Cologuard versus colonoscopy Referral to healthy weight management   Orders Placed This Encounter  Procedures   Ambulatory referral to Gastroenterology   Amb Ref to Medical Weight Management    New Prescriptions   DEXLANSOPRAZOLE 30 MG CAPSULE DR    Take 1 capsule (30 mg total) by mouth 2 (two) times daily.   Modified Medications   No medications on file    Planned Follow Up No follow-ups on file.   Total Time in Face-to-Face and in Coordination of Care for patient including independent/personal interpretation/review of prior testing, medical history, examination, medication adjustment, communicating results with the patient directly, and documentation within the EHR is 25 minutes.   Corliss Parish, MD [facility] Office # [phone]

## 2024-02-23 NOTE — Patient Instructions (Addendum)
 Stop Protonix.  Please purchase the following medications over the counter and take as directed: Pepcid at bedtime as needed   We have sent the following medications to your pharmacy for you to pick up at your convenience: Dexilant   You have been referred to [facility]. If you have not heard from their office within 1-2 weeks, please contact them at :  Address: [address] [place], [place], [place] [address]  Phone: [phone]  You have been scheduled for an endoscopy. Please follow written instructions given to you at your visit today.  If you use inhalers (even only as needed), please bring them with you on the day of your procedure.  If you take any of the following medications, they will need to be adjusted prior to your procedure:   DO NOT TAKE 7 DAYS PRIOR TO TEST- Trulicity (dulaglutide) Ozempic, Wegovy (semaglutide) Mounjaro (tirzepatide) Bydureon Bcise (exanatide extended release)  DO NOT TAKE 1 DAY PRIOR TO YOUR TEST Rybelsus (semaglutide) Adlyxin (lixisenatide) Victoza (liraglutide) Byetta (exanatide) ___________________________________________________________________________    Due to recent changes in healthcare laws, you may see the results of your imaging and laboratory studies on MyChart before your provider has had a chance to review them.  We understand that in some cases there may be results that are confusing or concerning to you. Not all laboratory results come back in the same time frame and the provider may be waiting for multiple results in order to interpret others.  Please give [place] 48 hours in order for your provider to thoroughly review all the results before contacting the office for clarification of your results.   GERD Lifestyle modifications -Eat meals >3 hours before bedtime -Do not lay down or lie flat immediately after eating for at least 2-hours -Do not overeat (decrease portion size and/or eat more frequent  smaller meals) -Eat slowly -Wear Loose-fitting clothes -Raise Head of Bed (Head & Chest > Feet with bed blocks) -Avoid foods that trigger the symptoms (Onions, Chocolate, Caffeine, Spicy, and Fatty) -Work on Losing [employer] -Stop Tobacco Use -Avoid Alcohol -Increase Exercise Activity -Maintain Heartburn Diary/Log   Thank you for choosing me and [facility].  Dr. Meridee Score

## 2024-03-04 ENCOUNTER — Encounter: Payer: Self-pay | Admitting: Family Medicine

## 2024-03-04 ENCOUNTER — Ambulatory Visit: Admitting: Family Medicine

## 2024-03-04 ENCOUNTER — Ambulatory Visit: Payer: Self-pay | Admitting: Family Medicine

## 2024-03-04 VITALS — BP 120/86 | HR 59 | Temp 98.1°F | Wt 311.6 lb

## 2024-03-04 DIAGNOSIS — J020 Streptococcal pharyngitis: Secondary | ICD-10-CM | POA: Diagnosis not present

## 2024-03-04 DIAGNOSIS — J029 Acute pharyngitis, unspecified: Secondary | ICD-10-CM

## 2024-03-04 LAB — POCT RAPID STREP A (OFFICE): Rapid Strep A Screen: POSITIVE — AB

## 2024-03-04 LAB — POC COVID19 BINAXNOW: SARS Coronavirus 2 Ag: NEGATIVE

## 2024-03-04 MED ORDER — AZITHROMYCIN 250 MG PO TABS: ORAL_TABLET | ORAL | 0 refills | Status: AC

## 2024-03-04 NOTE — Telephone Encounter (Signed)
 FYI about patient care, seeing another provider today

## 2024-03-04 NOTE — Telephone Encounter (Signed)
 Noted, thanks.  Appreciate Dr. Caryl Never seeing her.

## 2024-03-04 NOTE — Progress Notes (Signed)
 Established Patient Office Visit  Subjective   Patient ID: Terri Wiley, female    DOB: 18-Dec-1976  Age: 48 y.o. MRN: [id number]  Chief Complaint  Patient presents with   Sore Throat    Patient complains of sore throat, x2 days,    Cough    Patient complains of productive cough, x2 days, Tried Mucinex    Ear Pain    Patient complains of ear pain, x2 days,Tried Ibuprofen     HPI   Terri Wiley is seen today as a work- in with sore throat, left earache, mild cough past couple days.  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  Denies any nausea, vomiting, or diarrhea.  Main symptom is sore throat.  Cough has been relatively mild.  No significant nasal congestion.  Does have reported history of allergy to penicillin with hives.  Past Medical History:  Diagnosis Date   GERD (gastroesophageal reflux disease)    Hernia cerebri (HCC)    Papanicolaou smear of cervix with positive high risk human papilloma virus (HPV) test 06/24/2021   06/24/21 repeat pap in 1 year per ASCCP guidelines    Trichimoniasis 06/24/2021   Treated 06/24/21, POC___________   Past Surgical History:  Procedure Laterality Date   BREAST BIOPSY Left 06/2022   Fibroadenoma   HERNIA REPAIR     tubiligation      reports that she has never smoked. She has never used smokeless tobacco. She reports that she does not drink alcohol and does not use drugs. family history includes Aneurysm in her father; Diabetes in her mother; Hypertension in her father and mother; Liver disease in her mother. Allergies  Allergen Reactions   Bee Venom Shortness Of Breath   Penicillins Hives   Hydrocodone Hives    Review of Systems  Constitutional:  Negative for chills.  HENT:  Positive for ear pain and sore throat.   Neurological:  Positive for headaches.      Objective:     BP 120/86 (BP Location: Left Arm, Patient Position: Sitting, Cuff Size: Large)   Pulse (!) 59   Temp 98.1 F (36.7 C) (Oral)   Wt  (!) 311 lb 9.6 oz (141.3 kg)   SpO2 97%   BMI 48.80 kg/m  BP Readings from Last 3 Encounters:  03/04/24 120/86  02/23/24 106/74  11/02/23 135/83   Wt Readings from Last 3 Encounters:  03/04/24 (!) 311 lb 9.6 oz (141.3 kg)  02/23/24 (!) 305 lb 8 oz (138.6 kg)  11/02/23 (!) 302 lb 3.2 oz (137.1 kg)      Physical Exam Vitals reviewed.  Constitutional:      General: She is not in acute distress.    Appearance: She is not ill-appearing.  HENT:     Right Ear: Tympanic membrane normal.     Left Ear: Tympanic membrane normal.     Mouth/Throat:     Comments: Mild posterior pharynx erythema.  No visible exudate. Cardiovascular:     Rate and Rhythm: Normal rate and regular rhythm.  Pulmonary:     Effort: Pulmonary effort is normal.     Breath sounds: Normal breath sounds. No wheezing or rales.  Musculoskeletal:     Cervical back: Neck supple.  Neurological:     Mental Status: She is alert.      Results for orders placed or performed in visit on 03/04/24  POC Rapid Strep A  Result Value Ref Range   Rapid Strep A Screen Positive (  A) Negative  POC COVID-19  Result Value Ref Range   SARS Coronavirus 2 Ag Negative Negative      The 10-year ASCVD risk score (Arnett DK, et al., 2019) is: 2.3%    Assessment & Plan:   Problem List Items Addressed This Visit   None Visit Diagnoses       Sore throat    -  Primary   Relevant Orders   POC Rapid Strep A (Completed)   POC COVID-19 (Completed)     Rapid strep is positive.  Patient has penicillin allergy.  Start Zithromax 250 mg 2 tablets today and then 1 daily for 4 more days.  Continue over-the-counter analgesics as needed.  Follow-up for any persistent or worsening symptoms.  No follow-ups on file.    Evelena Peat, MD

## 2024-03-04 NOTE — Telephone Encounter (Signed)
 Copied from CRM [id number]. Topic: Clinical - Red Word Triage >> Mar 04, 2024  8:28 AM Gurney Maxin H wrote: [facility] that prompted transfer to Nurse Triage: Sore throat ear pain on left side and left side of neck swollen  Chief Complaint: sore throat Symptoms: 6/10 sore throat, scratchy, low grade temp Frequency: yesterday Pertinent Negatives: Patient denies SOB, n/v Disposition: [] ED /[] Urgent Care (no appt availability in office) / [x] Appointment(In office/virtual)/ []  [facility]/ [] Home Care/ [] Refused Recommended Disposition /[] [facility]/ []  Follow-up with PCP Additional Notes: pt states she is having left ear pain 8/10 and left neck swelling - states possible lymph node swelling. C/o loss of appetite and nasal Drainage that runs in back of throat Reason for Disposition  Earache also present  Answer Assessment - Initial Assessment Questions 1. ONSET: "When did the throat start hurting?" (Hours or days ago)      yesterday 2. SEVERITY: "How bad is the sore throat?" (Scale 1-10; mild, moderate or severe)   - MILD (1-3):  Doesn't interfere with eating or normal activities.   - MODERATE (4-7): Interferes with eating some solids and normal activities.   - SEVERE (8-10):  Excruciating pain, interferes with most normal activities.   - SEVERE WITH DYSPHAGIA (10): Can't swallow liquids, drooling.     6/10 3. STREP EXPOSURE: "Has there been any exposure to strep within the past week?" If Yes, ask: "What type of contact occurred?"      N/a 4.  VIRAL SYMPTOMS: "Are there any symptoms of a cold, such as a runny nose, cough, hoarse voice or red eyes?"      Runny nose, cough-productive clear, 5. FEVER: "Do you have a fever?" If Yes, ask: "What is your temperature, how was it measured, and when did it start?"     Low grade 6. PUS ON THE TONSILS: "Is there pus on the tonsils in the back of your throat?"     no 7. OTHER SYMPTOMS: "Do you have any other symptoms?" (e.g., difficulty  breathing, headache, rash)     Scratchy, nasal drainage, low grade fever, left ear pain 8/10, left neck swollen 8. PREGNANCY: "Is there any chance you are pregnant?" "When was your last menstrual period?"     N/a  Protocols used: Sore Throat-A-AH

## 2024-03-08 ENCOUNTER — Encounter: Payer: Self-pay | Admitting: Gastroenterology

## 2024-03-08 ENCOUNTER — Other Ambulatory Visit: Payer: Self-pay | Admitting: Family Medicine

## 2024-03-08 MED ORDER — CETIRIZINE HCL 10 MG PO TABS: 10.0000 mg | ORAL_TABLET | Freq: Every day | ORAL | 0 refills | Status: DC

## 2024-03-09 NOTE — Telephone Encounter (Signed)
 Dr Meridee Score ok to switch back to pantoprazole 40 mg?

## 2024-03-09 NOTE — Telephone Encounter (Signed)
 Terri Wiley, Why do not we try to see if we can have the patient do generic rabeprazole 20 mg twice daily. I looked on good Rx and it looks like for a 30-day supply she would be paying less than $30 at [facility] or [facility] or [facility] or [facility]. If she is amenable to that lets try to do that rather than going back to the Protonix. Let me know if she decides to go down that route and if it does work out. Thanks. GM

## 2024-03-14 ENCOUNTER — Encounter: Payer: Self-pay | Admitting: Family Medicine

## 2024-03-14 ENCOUNTER — Ambulatory Visit (INDEPENDENT_AMBULATORY_CARE_PROVIDER_SITE_OTHER): Admitting: Family Medicine

## 2024-03-14 VITALS — BP 128/80 | HR 74 | Temp 98.4°F | Ht 67.0 in | Wt 306.8 lb

## 2024-03-14 DIAGNOSIS — R600 Localized edema: Secondary | ICD-10-CM

## 2024-03-14 DIAGNOSIS — R052 Subacute cough: Secondary | ICD-10-CM | POA: Diagnosis not present

## 2024-03-14 DIAGNOSIS — R918 Other nonspecific abnormal finding of lung field: Secondary | ICD-10-CM | POA: Diagnosis not present

## 2024-03-14 DIAGNOSIS — R062 Wheezing: Secondary | ICD-10-CM

## 2024-03-14 DIAGNOSIS — R0600 Dyspnea, unspecified: Secondary | ICD-10-CM

## 2024-03-14 DIAGNOSIS — K219 Gastro-esophageal reflux disease without esophagitis: Secondary | ICD-10-CM | POA: Diagnosis not present

## 2024-03-14 MED ORDER — BENZONATATE 100 MG PO CAPS: 100.0000 mg | ORAL_CAPSULE | Freq: Three times a day (TID) | ORAL | 0 refills | Status: DC | PRN

## 2024-03-14 MED ORDER — LEVOFLOXACIN 750 MG PO TABS: 750.0000 mg | ORAL_TABLET | Freq: Every day | ORAL | 0 refills | Status: DC

## 2024-03-14 MED ORDER — ALBUTEROL SULFATE HFA 108 (90 BASE) MCG/ACT IN AERS: 2.0000 | INHALATION_SPRAY | Freq: Four times a day (QID) | RESPIRATORY_TRACT | 0 refills | Status: DC | PRN

## 2024-03-14 MED ORDER — RABEPRAZOLE SODIUM 20 MG PO TBEC: 20.0000 mg | DELAYED_RELEASE_TABLET | Freq: Two times a day (BID) | ORAL | 1 refills | Status: DC

## 2024-03-14 NOTE — Patient Instructions (Addendum)
 For GERD, continue Pepcid. I ordered the Aciphex (rabeprazole) to [facility] as recommended by your gastroenterologist.    For cough, please have x-ray performed at the facility below.  I am checking some blood work today including a heart failure blood test but I do not think that is the cause of the swelling or cough.  Similar cough could be related to the heartburn as above, so hopefully the omeprazole will help.  In addition okay to use Mucinex over-the-counter or the Jerilynn Som that I sent to the pharmacy.  I do hear some congestion on the lower right lung, which could be an early pneumonia.  Since she just finished azithromycin, I wrote for a stronger antibiotic that is not related to the penicillins.  Recheck in 4 days, sooner if any new or worsening symptoms and thank you for coming in today.   Return to the clinic or go to the nearest emergency room if any of your symptoms worsen or new symptoms occur.   [facility] or xray: Walk in 8:30-4:30 during weekdays, no appointment needed [address]  [place], [place] [address]

## 2024-03-14 NOTE — Progress Notes (Unsigned)
 Subjective:  Patient ID: Terri Wiley, female    DOB: 1976/10/09  Age: 48 y.o. MRN: [id number]  CC:  Chief Complaint  Patient presents with   Cough    Notes cough, wheezing, pt was seen by another dr for this last week but doesn't seem to be improving, now getting some back pain pos strep test last week    Medication Refill    Pt requests Protonix refill     HPI Terri Wiley presents for   Cough, wheeze Initially evaluated by Dr. Caryl Never on March 7. positive rapid strep testing at that time.  Treated with azithromycin, Z-Pak.  Penicillin allergy. Took antibiotic.  Sore throat better, but cough has persisted.   She has taken Protonix for GERD.  Needs refill. Saw GI on 2/25 - switched to Dexilant - insurance would not cover. Recommended pepcid 20mg  at bedtime with additional dose in day - she is taking 2 times per day - still some breakthrough heartburn, reflux. Off PPI since 2/25. Dexilant too costly. Note from Dr. Meridee Score on 03/09/24 - recommended generic rabeprazole 20mg  BID pain in upper back at times.  Catching in throat with cough, catching sensation. Posttussive emesis few times.  No recent fever.  Wheezing past week. No recent inhaler - used inhaler in past with cold with respiratory infections. Albuterol has helped in past. No known hx of asthma, no hx of CHF.  Some fluids in legs noted in past. More recently - past few weeks. No chest pains. Some shortness of breath at times.   Separate notation - will be meeting with healthy weight and wellness tomorrow. Visit with bariatric surgeon did not go well. Didn't feel right. Would lie to hold on meeting with other bariatric surgeon at this time. Would like to meet with medical weigh loss specialist at this point.    History Patient Active Problem List   Diagnosis Date Noted   Morbid obesity (HCC) 02/23/2024   Non-cardiac chest pain 02/23/2024   Advised about management of weight 02/23/2024   Bunion of great toe 07/09/2023    Folic acid deficiency (non anemic) 07/09/2023   Heart murmur 07/09/2023   Herniated lumbar intervertebral disc 07/09/2023   Lower back pain 07/09/2023   Morbid (severe) obesity due to excess calories (HCC) 07/09/2023   Scoliosis concern 07/09/2023   Gastroesophageal reflux disease 07/04/2022   Atypical chest pain 07/04/2022   Regurgitation of food 07/04/2022   Hiatal hernia 07/04/2022   Chronic constipation 07/04/2022   Colon cancer screening 07/04/2022   Calculus of gallbladder without cholecystitis without obstruction 07/04/2022   Dysfunctional gallbladder 07/04/2022   LLQ pain 07/11/2021   Hot flashes 07/11/2021   Papanicolaou smear of cervix with positive high risk human papilloma virus (HPV) test 06/24/2021   Trichomoniasis 06/24/2021   Menorrhagia with irregular cycle 06/19/2021   Fibroids 06/19/2021   Perimenopause 06/19/2021   Routine Papanicolaou smear 06/19/2021   Mass of left hip region 02/28/2021   Primary osteoarthritis of knees, bilateral 04/08/2018   Radicular pain of left lower extremity 04/08/2018   Past Medical History:  Diagnosis Date   GERD (gastroesophageal reflux disease)    Hernia cerebri (HCC)    Papanicolaou smear of cervix with positive high risk human papilloma virus (HPV) test 06/24/2021   06/24/21 repeat pap in 1 year per ASCCP guidelines    Trichimoniasis 06/24/2021   Treated 06/24/21, POC___________   Past Surgical History:  Procedure Laterality Date   BREAST BIOPSY Left 06/2022   Fibroadenoma  HERNIA REPAIR     tubiligation     Allergies  Allergen Reactions   Bee Venom Shortness Of Breath   Penicillins Hives   Hydrocodone Hives   Prior to Admission medications   Medication Sig Start Date End Date Taking? Authorizing Provider  acetaminophen (TYLENOL) 325 MG tablet Take 650 mg by mouth as needed. pain   Yes [provider]  aspirin 81 MG EC tablet Take 81 mg by mouth daily. Swallow whole.   Yes [provider]   cetirizine (ZYRTEC) 10 MG tablet Take 1 tablet (10 mg total) by mouth daily. 03/08/24  Yes Shade Flood, MD  Dexlansoprazole 30 MG capsule DR Take 1 capsule (30 mg total) by mouth 2 (two) times daily. 02/23/24  Yes Mansouraty, Netty Starring., MD  FEROSUL 325 (65 Fe) MG tablet Take 325 mg by mouth daily. 12/02/20  Yes [provider]  furosemide (LASIX) 40 MG tablet Take 1 tablet (40 mg total) by mouth daily as needed for fluid or edema. 06/26/23  Yes Shade Flood, MD  meloxicam (MOBIC) 7.5 MG tablet Take 1 tablet (7.5 mg total) by mouth daily. Patient taking differently: Take 7.5 mg by mouth as needed. 07/23/22  Yes Shade Flood, MD  naproxen sodium (ALEVE) 220 MG tablet Take 220 mg by mouth as needed.   Yes [provider]  Throat Lozenges (RA VITAMIN C COUGH DROPS MT) Use as directed 2 lozenges in the mouth or throat daily.   Yes [provider]   Social History   Socioeconomic History   Marital status: Divorced    Spouse name: Not on file   Number of children: Not on file   Years of education: Not on file   Highest education level: Bachelor's degree (e.g., BA, AB, BS)  Occupational History   Not on file  Tobacco Use   Smoking status: Never   Smokeless tobacco: Never  Vaping Use   Vaping status: Never Used  Substance and Sexual Activity   Alcohol use: No   Drug use: No   Sexual activity: Yes    Birth control/protection: Surgical    Comment: tubal  Other Topics Concern   Not on file  Social History Narrative   Not on file   Social Drivers of Health   Financial Resource Strain: Low Risk  (06/24/2023)   Overall Financial Resource Strain (CARDIA)    Difficulty of Paying Living Expenses: Not hard at all  Food Insecurity: Unknown (06/24/2023)   Hunger Vital Sign    Worried About Running Out of Food in the Last Year: Not on file    Ran Out of Food in the Last Year: Never true  Transportation Needs: No Transportation Needs (06/24/2023)   PRAPARE  - Administrator, Civil Service (Medical): No    Lack of Transportation (Non-Medical): No  Physical Activity: Unknown (06/24/2023)   Exercise Vital Sign    Days of Exercise per Week: 0 days    Minutes of Exercise per Session: Not on file  Stress: No Stress Concern Present (06/24/2023)   [employer] of Occupational Health - Occupational Stress Questionnaire    Feeling of Stress : Not at all  Social Connections: Moderately Isolated (06/24/2023)   Social Connection and Isolation Panel [NHANES]    Frequency of Communication with Friends and Family: More than three times a week    Frequency of Social Gatherings with Friends and Family: Twice a week    Attends Religious Services: 1 to  4 times per year    Active Member of Clubs or Organizations: No    Attends Banker Meetings: Not on file    Marital Status: Divorced  Intimate Partner Violence: Unknown (04/03/2022)   Received from [employer], [facility]   HITS    Physically Hurt: Not on file    Insult or Talk Down To: Not on file    Threaten Physical Harm: Not on file    Scream or Curse: Not on file    Review of Systems   Objective:   Vitals:   03/14/24 1427  BP: 128/80  Pulse: 74  Temp: 98.4 F (36.9 C)  TempSrc: Temporal  SpO2: 98%  Weight: (!) 306 lb 12.8 oz (139.2 kg)  Height: 5\' 7"  (1.702 m)     Physical Exam Vitals reviewed.  Constitutional:      Appearance: Normal appearance. She is well-developed.  HENT:     Head: Normocephalic and atraumatic.  Eyes:     Conjunctiva/sclera: Conjunctivae normal.     Pupils: Pupils are equal, round, and reactive to light.  Neck:     Vascular: No carotid bruit.  Cardiovascular:     Rate and Rhythm: Normal rate and regular rhythm.     Heart sounds: Normal heart sounds.  Pulmonary:     Effort: Pulmonary effort is normal.     Breath sounds: Rhonchi (Right lower lobe, no audible wheeze or stridor.  Speaking in full sentences, clearing secretions.   No respiratory distress.) present.  Abdominal:     Palpations: Abdomen is soft. There is no pulsatile mass.     Tenderness: There is no abdominal tenderness.  Musculoskeletal:     Right lower leg: Edema (1-2+ pitting to lower third tibia bilateral.  Skin intact.) present.     Left lower leg: Edema present.  Skin:    General: Skin is warm and dry.  Neurological:     Mental Status: She is alert and oriented to person, place, and time.  Psychiatric:        Mood and Affect: Mood normal.        Behavior: Behavior normal.        Assessment & Plan:  Shalie Schremp is a 48 y.o. female . Subacute cough - Plan: CBC, DG Chest 2 View, levofloxacin (LEVAQUIN) 750 MG tablet, benzonatate (TESSALON) 100 MG capsule  Gastroesophageal reflux disease, unspecified whether esophagitis present - Plan: RABEprazole (ACIPHEX) 20 MG tablet  Lung field abnormal finding on examination - Plan: DG Chest 2 View, levofloxacin (LEVAQUIN) 750 MG tablet  Pedal edema - Plan: Pro b natriuretic peptide  Dyspnea, unspecified type  Wheezing - Plan: albuterol (VENTOLIN HFA) 108 (90 Base) MCG/ACT inhaler   Cough likely multifactorial including respiratory illness, postnasal drip, and untreated reflux.  Upper back discomfort likely muscular from persistent cough.  Rhonchi right lower lobe, possible early community-acquired pneumonia, status post azithromycin as above.  Penicillin allergy.  Started on Levaquin, risks of quinolone antibiotics discussed including but not limited to neuropathy, tendinopathy or rupture.  Understanding expressed.  Symptomatic care with Tessalon Perles, Mucinex, and RTC precautions.  Chest x-ray obtained, no discrete pneumonia but continue plan as above.  In regards to her reflux, planned reviewed from gastroenterology regarding medication coverage and I wrote Aciphex 20 mg twice per day, and provided good Rx card.  There was a phone call following day regarding coverage of this medication, and will  have staff contact patient and advised her to use GoodRx card to  process that prescription.  Further questions regarding reflux, plan can be directed to her gastroenterologist.  Continue Pepcid.  Regarding the dyspnea, pedal edema, BNP obtained, chest x-ray obtained to rule out CHF.  Meds ordered this encounter  Medications   RABEprazole (ACIPHEX) 20 MG tablet    Sig: Take 1 tablet (20 mg total) by mouth 2 (two) times daily.    Dispense:  60 tablet    Refill:  1   levofloxacin (LEVAQUIN) 750 MG tablet    Sig: Take 1 tablet (750 mg total) by mouth daily.    Dispense:  5 tablet    Refill:  0   benzonatate (TESSALON) 100 MG capsule    Sig: Take 1 capsule (100 mg total) by mouth 3 (three) times daily as needed for cough.    Dispense:  20 capsule    Refill:  0   albuterol (VENTOLIN HFA) 108 (90 Base) MCG/ACT inhaler    Sig: Inhale 2 puffs into the lungs every 6 (six) hours as needed for wheezing or shortness of breath.    Dispense:  8 g    Refill:  0   Patient Instructions  For GERD, continue Pepcid. I ordered the Aciphex (rabeprazole) to [facility] as recommended by your gastroenterologist.    For cough, please have x-ray performed at the facility below.  I am checking some blood work today including a heart failure blood test but I do not think that is the cause of the swelling or cough.  Similar cough could be related to the heartburn as above, so hopefully the omeprazole will help.  In addition okay to use Mucinex over-the-counter or the Jerilynn Som that I sent to the pharmacy.  I do hear some congestion on the lower right lung, which could be an early pneumonia.  Since she just finished azithromycin, I wrote for a stronger antibiotic that is not related to the penicillins.  Recheck in 4 days, sooner if any new or worsening symptoms and thank you for coming in today.   Return to the clinic or go to the nearest emergency room if any of your symptoms worsen or new symptoms  occur.   [facility] or xray: Walk in 8:30-4:30 during weekdays, no appointment needed [address]  [place], [place] [address]     Signed,   Meredith Staggers, MD [facility], [facility] 03/15/24 3:17 PM

## 2024-03-15 ENCOUNTER — Encounter: Payer: Self-pay | Admitting: Family Medicine

## 2024-03-15 ENCOUNTER — Ambulatory Visit (INDEPENDENT_AMBULATORY_CARE_PROVIDER_SITE_OTHER)
Admission: RE | Admit: 2024-03-15 | Discharge: 2024-03-15 | Disposition: A | Discharge status: Home / Self Care | Source: Ambulatory Visit | Attending: Family Medicine | Admitting: Family Medicine

## 2024-03-15 ENCOUNTER — Ambulatory Visit (INDEPENDENT_AMBULATORY_CARE_PROVIDER_SITE_OTHER): Admitting: Nurse Practitioner

## 2024-03-15 ENCOUNTER — Other Ambulatory Visit: Payer: Self-pay | Admitting: Family Medicine

## 2024-03-15 ENCOUNTER — Encounter: Payer: Self-pay | Admitting: Nurse Practitioner

## 2024-03-15 ENCOUNTER — Telehealth: Payer: Self-pay

## 2024-03-15 VITALS — BP 130/87 | HR 72 | Temp 98.2°F | Ht 67.0 in | Wt 302.0 lb

## 2024-03-15 DIAGNOSIS — K219 Gastro-esophageal reflux disease without esophagitis: Secondary | ICD-10-CM | POA: Diagnosis not present

## 2024-03-15 DIAGNOSIS — E66813 Obesity, class 3: Secondary | ICD-10-CM | POA: Diagnosis not present

## 2024-03-15 DIAGNOSIS — R0989 Other specified symptoms and signs involving the circulatory and respiratory systems: Secondary | ICD-10-CM | POA: Diagnosis not present

## 2024-03-15 DIAGNOSIS — R052 Subacute cough: Secondary | ICD-10-CM

## 2024-03-15 DIAGNOSIS — R059 Cough, unspecified: Secondary | ICD-10-CM | POA: Diagnosis not present

## 2024-03-15 DIAGNOSIS — R918 Other nonspecific abnormal finding of lung field: Secondary | ICD-10-CM | POA: Diagnosis not present

## 2024-03-15 DIAGNOSIS — K449 Diaphragmatic hernia without obstruction or gangrene: Secondary | ICD-10-CM | POA: Diagnosis not present

## 2024-03-15 DIAGNOSIS — Z6841 Body Mass Index (BMI) 40.0 and over, adult: Secondary | ICD-10-CM

## 2024-03-15 DIAGNOSIS — J9811 Atelectasis: Secondary | ICD-10-CM | POA: Diagnosis not present

## 2024-03-15 LAB — CBC
HCT: 29.9 % — ABNORMAL LOW (ref 36.0–46.0)
Hemoglobin: 9.4 g/dL — ABNORMAL LOW (ref 12.0–15.0)
MCHC: 31.6 g/dL (ref 30.0–36.0)
MCV: 79.5 fl (ref 78.0–100.0)
Platelets: 326 10*3/uL (ref 150.0–400.0)
RBC: 3.76 Mil/uL — ABNORMAL LOW (ref 3.87–5.11)
RDW: 17.9 % — ABNORMAL HIGH (ref 11.5–15.5)
WBC: 6.8 10*3/uL (ref 4.0–10.5)

## 2024-03-15 LAB — PRO B NATRIURETIC PEPTIDE: NT-Pro BNP: 45 pg/mL (ref 0–249)

## 2024-03-15 NOTE — Telephone Encounter (Signed)
 Should we do PA or try other med for patients condition?

## 2024-03-15 NOTE — Telephone Encounter (Signed)
 Called patient to go over lab results, left VM to return call.

## 2024-03-15 NOTE — Telephone Encounter (Signed)
 Lab results have been discussed.   Verbalized understanding? Yes  Are there any questions? No

## 2024-03-15 NOTE — Telephone Encounter (Signed)
 We discussed this yesterday, her gastroenterologist recommended this medication and using the GoodRx card.  Per gastroenterology, that should not be too expensive with GoodRx out-of-pocket.  Please have her try to fill it using GoodRx, not insurance.

## 2024-03-15 NOTE — Progress Notes (Signed)
 Office: [phone]  /  Fax: [phone]   Initial Visit  Terri Wiley was seen in clinic today to evaluate for obesity. She is interested in losing weight to improve overall health and reduce the risk of weight related complications. She presents today to review program treatment options, initial physical assessment, and evaluation.     She was referred by: Specialist  When asked what else they would like to accomplish? She states: Adopt healthier eating patterns, Improve existing medical conditions, Reduce number of medications, Improve quality of life, Improve appearance, and Improve self-confidence  Weight history:  she started gaining weight after starting having her children at the age of 13.  She started gaining excess weight after her son passed away suddenly in 04/07/21 at the age of 66.    When asked how has your weight affected you? She states: Contributed to orthopedic problems or mobility issues, Having fatigue, and Having poor endurance  Some associated conditions:   hiatal hernia, GERD, OA knees, back pain, folic acid deficiency, heart murmur,  Contributing factors: Family history of obesity, Use of obesogenic medications: Steroids and Contraceptives or hormonal therapy, Moderate to high levels of stress, and Reduced physical activity  Weight promoting medications identified: Steroids and Contraceptives or hormonal therapy  Current nutrition plan: None  Current level of physical activity: None  Current or previous pharmacotherapy: None  Response to medication: Never tried medications   Past medical history includes:   Past Medical History:  Diagnosis Date   GERD (gastroesophageal reflux disease)    Hernia cerebri (HCC)    Papanicolaou smear of cervix with positive high risk human papilloma virus (HPV) test 06/24/2021   06/24/21 repeat pap in 1 year per ASCCP guidelines    Trichimoniasis 06/24/2021   Treated 06/24/21, POC___________     Objective:   BP 130/87    Pulse 72   Temp 98.2 F (36.8 C)   Ht 5\' 7"  (1.702 m)   Wt (!) 302 lb (137 kg)   LMP  (LMP Unknown)   SpO2 96%   BMI 47.30 kg/m  She was weighed on the bioimpedance scale: Body mass index is 47.3 kg/m.  Peak Weight:325 lbs , Body Fat%:56%, Visceral Fat Rating:19, Weight trend over the last 12 months: Increasing  General:  Alert, oriented and cooperative. Patient is in no acute distress.  Respiratory: Normal respiratory effort, no problems with respiration noted   Gait: able to ambulate independently  Mental Status: Normal mood and affect. Normal behavior. Normal judgment and thought content.   DIAGNOSTIC DATA REVIEWED:  BMET    Component Value Date/Time   NA 140 07/07/2023 1507   NA 143 06/19/2021 1124   K 3.6 07/07/2023 1507   CL 105 07/07/2023 1507   CO2 28 07/07/2023 1507   GLUCOSE 104 (H) 07/07/2023 1507   BUN 11 07/07/2023 1507   BUN 10 06/19/2021 1124   CREATININE 0.78 07/07/2023 1507   CALCIUM 8.5 (L) 07/07/2023 1507   GFRNONAA >60 07/07/2023 1507   Lab Results  Component Value Date   HGBA1C 5.2 05/28/2022   HGBA1C 5.4 02/27/2022   No results found for: "INSULIN" CBC    Component Value Date/Time   WBC 6.8 03/14/2024 1557   RBC 3.76 (L) 03/14/2024 1557   HGB 9.4 (L) 03/14/2024 1557   HGB 11.0 (L) 06/19/2021 1124   HCT 29.9 (L) 03/14/2024 1557   HCT 34.7 06/19/2021 1124   PLT 326.0 03/14/2024 1557   PLT 299 06/19/2021 1124   MCV 79.5  03/14/2024 1557   MCV 87 06/19/2021 1124   MCH 28.8 06/24/2023 1620   MCHC 31.6 03/14/2024 1557   RDW 17.9 (H) 03/14/2024 1557   RDW 14.2 06/19/2021 1124   Iron/TIBC/Ferritin/ %Sat No results found for: "IRON", "TIBC", "FERRITIN", "IRONPCTSAT" Lipid Panel     Component Value Date/Time   CHOL 151 02/27/2022 1025   TRIG 46.0 02/27/2022 1025   HDL 48.10 02/27/2022 1025   CHOLHDL 3 02/27/2022 1025   VLDL 9.2 02/27/2022 1025   LDLCALC 94 02/27/2022 1025   Hepatic Function Panel     Component Value Date/Time   PROT  7.3 06/24/2023 1620   PROT 7.2 06/19/2021 1124   ALBUMIN 3.5 06/24/2023 1620   ALBUMIN 3.8 06/19/2021 1124   AST 12 (L) 06/24/2023 1620   ALT 12 06/24/2023 1620   ALKPHOS 93 06/24/2023 1620   BILITOT 0.4 06/24/2023 1620   BILITOT 0.3 06/19/2021 1124   BILIDIR <0.1 05/15/2021 1239   IBILI NOT CALCULATED 05/15/2021 1239      Component Value Date/Time   TSH 1.239 07/07/2023 1507   TSH 0.917 06/19/2021 1124     Assessment and Plan:   Gastroesophageal reflux disease, unspecified whether esophagitis present Continue follow-up with GI and PCP.  Take medications as directed.  Class 3 severe obesity due to excess calories without serious comorbidity with body mass index (BMI) of 45.0 to 49.9 in adult [facility])        Obesity Treatment / Action Plan:  Patient will work on garnering support from family and friends to begin weight loss journey. Will work on eliminating or reducing the presence of highly palatable, calorie dense foods in the home. Will complete provided nutritional and psychosocial assessment questionnaire before the next appointment. Will be scheduled for indirect calorimetry to determine resting energy expenditure in a fasting state.  This will allow [place] to create a reduced calorie, high-protein meal plan to promote loss of fat mass while preserving muscle mass. Counseled on the health benefits of losing 5%-15% of total body weight. Was counseled on nutritional approaches to weight loss and benefits of reducing processed foods and consuming plant-based foods and high quality protein as part of nutritional weight management. Was counseled on pharmacotherapy and role as an adjunct in weight management.   Obesity Education Performed Today:  She was weighed on the bioimpedance scale and results were discussed and documented in the synopsis.  We discussed obesity as a disease and the importance of a more detailed evaluation of all the factors contributing to the disease.  We  discussed the importance of long term lifestyle changes which include nutrition, exercise and behavioral modifications as well as the importance of customizing this to her specific health and social needs.  We discussed the benefits of reaching a healthier weight to alleviate the symptoms of existing conditions and reduce the risks of the biomechanical, metabolic and psychological effects of obesity.  Zeidy Tayag appears to be in the action stage of change and states they are ready to start intensive lifestyle modifications and behavioral modifications.  30 minutes was spent today on this visit including the above counseling, pre-visit chart review, and post-visit documentation.  Reviewed by clinician on day of visit: allergies, medications, problem list, medical history, surgical history, family history, social history, and previous encounter notes pertinent to obesity diagnosis.    Theodis Sato Kashay Cavenaugh FNP-C

## 2024-03-15 NOTE — Telephone Encounter (Signed)
 Copied from CRM [id number]. Topic: Clinical - Medication Question >> Mar 15, 2024  9:55 AM Almira Coaster wrote: Reason for CRM: Patient is calling because she was seen yesterday and prescribed RABEprazole (ACIPHEX) 20 MG tablet, while at the pharmacy they informed they patient that insurance denied the prescription and stated that they only cover the medication taken once a day not twice a day. She would like to know what Dr.Greene recommends either upping the dosage to 40mg  while taking it once a day or leaving it at 20 mg taking it once a day. Please advise patient once new prescription has been placed.

## 2024-03-15 NOTE — Telephone Encounter (Signed)
-----   Message from Shade Flood sent at 03/15/2024  3:25 PM EDT ----- Please call patient.  Infection fighting cells looked okay on blood work, heart failure test looked okay, but her hemoglobin level was lower than last reading in June of last year.  Has she had any dark stools, blood in her stool, new lightheadedness or dizziness?  If so, should be seen acutely to recheck that level.  Otherwise follow-up with me later this week as planned and we can repeat CBC at that time.

## 2024-03-16 NOTE — Telephone Encounter (Signed)
 Left vm to call office

## 2024-03-18 ENCOUNTER — Ambulatory Visit: Admitting: Family Medicine

## 2024-03-18 ENCOUNTER — Other Ambulatory Visit (INDEPENDENT_AMBULATORY_CARE_PROVIDER_SITE_OTHER)

## 2024-03-18 ENCOUNTER — Encounter: Payer: Self-pay | Admitting: Family Medicine

## 2024-03-18 VITALS — BP 126/78 | HR 77 | Temp 97.9°F | Ht 67.0 in | Wt 308.8 lb

## 2024-03-18 DIAGNOSIS — R052 Subacute cough: Secondary | ICD-10-CM

## 2024-03-18 DIAGNOSIS — D509 Iron deficiency anemia, unspecified: Secondary | ICD-10-CM

## 2024-03-18 DIAGNOSIS — R5383 Other fatigue: Secondary | ICD-10-CM

## 2024-03-18 DIAGNOSIS — Z8639 Personal history of other endocrine, nutritional and metabolic disease: Secondary | ICD-10-CM

## 2024-03-18 DIAGNOSIS — R42 Dizziness and giddiness: Secondary | ICD-10-CM

## 2024-03-18 LAB — BASIC METABOLIC PANEL
BUN: 14 mg/dL (ref 6–23)
CO2: 29 meq/L (ref 19–32)
Calcium: 9.4 mg/dL (ref 8.4–10.5)
Chloride: 108 meq/L (ref 96–112)
Creatinine, Ser: 0.7 mg/dL (ref 0.40–1.20)
GFR: 101.31 mL/min (ref >60.00)
Glucose, Bld: 92 mg/dL (ref 70–99)
Potassium: 3.9 meq/L (ref 3.5–5.1)
Sodium: 143 meq/L (ref 135–145)

## 2024-03-18 LAB — CBC
HCT: 31.1 % — ABNORMAL LOW (ref 36.0–46.0)
Hemoglobin: 9.7 g/dL — ABNORMAL LOW (ref 12.0–15.0)
MCHC: 31.4 g/dL (ref 30.0–36.0)
MCV: 79 fl (ref 78.0–100.0)
Platelets: 330 10*3/uL (ref 150.0–400.0)
RBC: 3.93 Mil/uL (ref 3.87–5.11)
RDW: 17.7 % — ABNORMAL HIGH (ref 11.5–15.5)
WBC: 6.6 10*3/uL (ref 4.0–10.5)

## 2024-03-18 NOTE — Addendum Note (Signed)
 Addended by: Eldred Manges on: 03/18/2024 04:26 PM   Modules accepted: Orders

## 2024-03-18 NOTE — Patient Instructions (Signed)
 Sorry to hear about the side effects with Levaquin.  I have added that to your allergy list, do not take that again.  Cough is better, lungs sound clear today.  Albuterol only if needed.  I suspect your cough will continue to improve.  Follow-up if any worsening symptoms.  For the lightheadedness and dizziness, I will check your blood work today, anemia test was slightly lower last visit than previous range.  I will also check electrolytes.  Please have labs performed at the [facility] location this morning, and I will have those run stat.  Should have results back later this afternoon.  Follow-up with me in the next week if symptoms are not improving.  Make sure you are drinking plenty of fluids, be seen in the ER or urgent care if any worsening symptoms.  [facility] or xray: Walk in 8:30-4:30 during weekdays, no appointment needed [address]  [place], [place] [address]

## 2024-03-18 NOTE — Progress Notes (Signed)
 Subjective:  Patient ID: Terri Wiley, female    DOB: [birth year]  Age: 48 y.o. MRN: [id number]  CC:  Chief Complaint  Patient presents with   Results    Discuss labs, some low levels, notes curious if this can contribute to light headed feeling at times    Cough    Cough has resolved but still very fatigued, she also states she is having a hard time with Abx making her feel sick     HPI Terri Wiley presents for   Cough: Follow-up from March 17 visit.  Persistent cough at that time after evaluation by other provider in March 7th, treated with Z-Pak initially for strep pharyngitis.  Persistent cough, but she had also been off of her PPI.  Dexilant cost prohibitive, note from her gastroenterologist recommending rabeprazole 20 mg twice daily, out-of-pocket with good Rx if needed.  Also with wheezing, albuterol prescribed.  Tessalon Perles for cough.  She did have some rhonchi right lower lobe, started on Levaquin for possible community-acquired pneumonia.  Chest x-ray did not indicate discrete pneumonia but continued on Levaquin, reflux treatment, albuterol if needed for wheeze.  Follow-up phone call clarifying plan for PPI.  proBNP was normal.  CBC with lower hemoglobin of 9.4 compared to 11 when checked 18 months ago.  Since last visit, has felt increased fatigue, occasional lightheadedness - comes and goes. No syncope, but episodes of near syncope at times - sits down and remain still and improves. Drinking fluids. No melena, hematochezia. No hematemesis, or coffee ground emesis.  Hx of anemia, on iron 325mg  every day.   Had a good visit at [facility].  Hx of hypokalemia in 05/2023 - 3.3.  Cough has improved. Only needing albuterol once per day, last used yesterday afternoon.  Neck and arm rash both sides - itchy, possible hives and vomiting - only took one dose Levaquin. N/v and rash resolved.    History Patient Active Problem List   Diagnosis Date Noted   Morbid  obesity (HCC) 02/23/2024   Non-cardiac chest pain 02/23/2024   Advised about management of weight 02/23/2024   Bunion of great toe 07/09/2023   Folic acid deficiency (non anemic) 07/09/2023   Heart murmur 07/09/2023   Herniated lumbar intervertebral disc 07/09/2023   Lower back pain 07/09/2023   Morbid (severe) obesity due to excess calories (HCC) 07/09/2023   Scoliosis concern 07/09/2023   Gastroesophageal reflux disease 07/04/2022   Atypical chest pain 07/04/2022   Regurgitation of food 07/04/2022   Hiatal hernia 07/04/2022   Chronic constipation 07/04/2022   Colon cancer screening 07/04/2022   Calculus of gallbladder without cholecystitis without obstruction 07/04/2022   Dysfunctional gallbladder 07/04/2022   LLQ pain 07/11/2021   Hot flashes 07/11/2021   Papanicolaou smear of cervix with positive high risk human papilloma virus (HPV) test 06/24/2021   Trichomoniasis 06/24/2021   Menorrhagia with irregular cycle 06/19/2021   Fibroids 06/19/2021   Perimenopause 06/19/2021   Routine Papanicolaou smear 06/19/2021   Mass of left hip region 02/28/2021   Primary osteoarthritis of knees, bilateral 04/08/2018   Radicular pain of left lower extremity 04/08/2018   Past Medical History:  Diagnosis Date   GERD (gastroesophageal reflux disease)    Hernia cerebri (HCC)    Papanicolaou smear of cervix with positive high risk human papilloma virus (HPV) test 06/24/2021   06/24/21 repeat pap in 1 year per ASCCP guidelines    Trichimoniasis 06/24/2021   Treated 06/24/21, POC___________   Past Surgical  History:  Procedure Laterality Date   BREAST BIOPSY Left 06/2022   Fibroadenoma   HERNIA REPAIR     tubiligation     Allergies  Allergen Reactions   Bee Venom Shortness Of Breath   Penicillins Hives   Hydrocodone Hives   Levaquin [Levofloxacin] Nausea And Vomiting and Rash   Prior to Admission medications   Medication Sig Start Date End Date Taking? Authorizing Provider   acetaminophen (TYLENOL) 325 MG tablet Take 650 mg by mouth as needed. pain   Yes [provider]  albuterol (VENTOLIN HFA) 108 (90 Base) MCG/ACT inhaler Inhale 2 puffs into the lungs every 6 (six) hours as needed for wheezing or shortness of breath. 03/14/24  Yes Shade Flood, MD  aspirin 81 MG EC tablet Take 81 mg by mouth daily. Swallow whole.   Yes [provider]  benzonatate (TESSALON) 100 MG capsule Take 1 capsule (100 mg total) by mouth 3 (three) times daily as needed for cough. 03/14/24  Yes Shade Flood, MD  cetirizine (ZYRTEC) 10 MG tablet Take 1 tablet (10 mg total) by mouth daily. 03/08/24  Yes Shade Flood, MD  FEROSUL 325 (65 Fe) MG tablet Take 325 mg by mouth daily. 12/02/20  Yes [provider]  furosemide (LASIX) 40 MG tablet Take 1 tablet (40 mg total) by mouth daily as needed for fluid or edema. 06/26/23  Yes Shade Flood, MD  levofloxacin (LEVAQUIN) 750 MG tablet Take 1 tablet (750 mg total) by mouth daily. 03/14/24  Yes Shade Flood, MD  meloxicam (MOBIC) 7.5 MG tablet Take 1 tablet (7.5 mg total) by mouth daily. Patient taking differently: Take 7.5 mg by mouth as needed. 07/23/22  Yes Shade Flood, MD  naproxen sodium (ALEVE) 220 MG tablet Take 220 mg by mouth as needed.   Yes [provider]  RABEprazole (ACIPHEX) 20 MG tablet Take 1 tablet (20 mg total) by mouth 2 (two) times daily. 03/14/24  Yes Shade Flood, MD  Throat Lozenges (RA VITAMIN C COUGH DROPS MT) Use as directed 2 lozenges in the mouth or throat daily.   Yes [provider]   Social History   Socioeconomic History   Marital status: Divorced    Spouse name: Not on file   Number of children: Not on file   Years of education: Not on file   Highest education level: Bachelor's degree (e.g., BA, AB, BS)  Occupational History   Not on file  Tobacco Use   Smoking status: Never   Smokeless tobacco: Never  Vaping Use   Vaping status: Never  Used  Substance and Sexual Activity   Alcohol use: No   Drug use: No   Sexual activity: Yes    Birth control/protection: Surgical    Comment: tubal  Other Topics Concern   Not on file  Social History Narrative   Not on file   Social Drivers of Health   Financial Resource Strain: Low Risk  (06/24/2023)   Overall Financial Resource Strain (CARDIA)    Difficulty of Paying Living Expenses: Not hard at all  Food Insecurity: Unknown (06/24/2023)   Hunger Vital Sign    Worried About Running Out of Food in the Last Year: Not on file    Ran Out of Food in the Last Year: Never true  Transportation Needs: No Transportation Needs (06/24/2023)   PRAPARE - Administrator, Civil Service (Medical): No    Lack of Transportation (Non-Medical): No  Physical Activity: Unknown (06/24/2023)   Exercise Vital Sign    Days of Exercise per Week: 0 days    Minutes of Exercise per Session: Not on file  Stress: No Stress Concern Present (06/24/2023)   [employer] of Occupational Health - Occupational Stress Questionnaire    Feeling of Stress : Not at all  Social Connections: Moderately Isolated (06/24/2023)   Social Connection and Isolation Panel [NHANES]    Frequency of Communication with Friends and Family: More than three times a week    Frequency of Social Gatherings with Friends and Family: Twice a week    Attends Religious Services: 1 to 4 times per year    Active Member of [employer] or Organizations: No    Attends Engineer, structural: Not on file    Marital Status: Divorced  Intimate Partner Violence: Unknown (04/03/2022)   Received from [employer], [facility]   HITS    Physically Hurt: Not on file    Insult or Talk Down To: Not on file    Threaten Physical Harm: Not on file    Scream or Curse: Not on file    Review of Systems Per HPI  Objective:   Vitals:   03/18/24 0755  BP: 126/78  Pulse: 77  Temp: 97.9 F (36.6 C)  TempSrc: Temporal  SpO2: 97%  Weight:  (!) 308 lb 12.8 oz (140.1 kg)  Height: 5\' 7"  (1.702 m)     Physical Exam Vitals reviewed.  Constitutional:      Appearance: Normal appearance. She is well-developed.  HENT:     Head: Normocephalic and atraumatic.  Eyes:     Conjunctiva/sclera: Conjunctivae normal.     Pupils: Pupils are equal, round, and reactive to light.  Neck:     Vascular: No carotid bruit.  Cardiovascular:     Rate and Rhythm: Normal rate and regular rhythm.     Heart sounds: Normal heart sounds.  Pulmonary:     Effort: Pulmonary effort is normal. No respiratory distress.     Breath sounds: Normal breath sounds. No wheezing, rhonchi or rales.  Abdominal:     General: There is no distension.     Palpations: Abdomen is soft. There is no pulsatile mass.     Tenderness: There is no abdominal tenderness. There is no guarding.  Musculoskeletal:     Right lower leg: No edema.     Left lower leg: No edema.  Skin:    General: Skin is warm and dry.  Neurological:     Mental Status: She is alert and oriented to person, place, and time.  Psychiatric:        Mood and Affect: Mood normal.        Behavior: Behavior normal.        Assessment & Plan:  Isabell Bonafede is a 48 y.o. female . Subacute cough  -Improved, unfortunately had side effects with Levaquin, added to allergy list.  With chest x-ray results and improved exam today, no further antibiotics.  Can also taper off albuterol less wheezing with RTC precautions not continue to improve or worsening symptoms.  Iron deficiency anemia, unspecified iron deficiency anemia type - Plan: CBC, Iron, TIBC and Ferritin Panel Lightheadedness - Plan: CBC, Iron, TIBC and Ferritin Panel Fatigue, unspecified type - Plan: CBC, Basic metabolic panel, Iron, TIBC and Ferritin Panel History of hypokalemia - Plan: Basic metabolic panel  -Lightheadedness, fatigue as above.  Blood pressure, heart rate in office stable.  Does have history of  anemia with slightly lower hemoglobin  on recent testing compared to 8 months ago.  Check stat CBC, BMP today as well as iron panel.  Remote history of hypokalemia but normalized on repeat testing.  ER/urgent care precautions given, maintain hydration, RTC precautions.  No orders of the defined types were placed in this encounter.  Patient Instructions  Sorry to hear about the side effects with Levaquin.  I have added that to your allergy list, do not take that again.  Cough is better, lungs sound clear today.  Albuterol only if needed.  I suspect your cough will continue to improve.  Follow-up if any worsening symptoms.  For the lightheadedness and dizziness, I will check your blood work today, anemia test was slightly lower last visit than previous range.  I will also check electrolytes.  Please have labs performed at the [facility] location this morning, and I will have those run stat.  Should have results back later this afternoon.  Follow-up with me in the next week if symptoms are not improving.  Make sure you are drinking plenty of fluids, be seen in the ER or urgent care if any worsening symptoms.  [facility] or xray: Walk in 8:30-4:30 during weekdays, no appointment needed [address]  [place], [place] [address]     Signed,   Meredith Staggers, MD [facility], [facility] 03/18/24 8:33 AM

## 2024-03-19 LAB — IRON,TIBC AND FERRITIN PANEL
%SAT: 7 % — ABNORMAL LOW (ref 16–45)
Ferritin: 7 ng/mL — ABNORMAL LOW (ref 16–232)
Iron: 27 ug/dL — ABNORMAL LOW (ref 40–190)
TIBC: 368 ug/dL (ref 250–450)

## 2024-03-22 ENCOUNTER — Encounter: Payer: Self-pay | Admitting: Family Medicine

## 2024-04-05 ENCOUNTER — Ambulatory Visit: Payer: BC Managed Care – PPO | Admitting: Gastroenterology

## 2024-04-05 ENCOUNTER — Other Ambulatory Visit: Payer: Self-pay | Admitting: Gastroenterology

## 2024-04-05 ENCOUNTER — Encounter: Payer: Self-pay | Admitting: Gastroenterology

## 2024-04-05 VITALS — BP 128/82 | HR 68 | Temp 97.4°F | Resp 14 | Ht 67.0 in | Wt 305.0 lb

## 2024-04-05 DIAGNOSIS — K259 Gastric ulcer, unspecified as acute or chronic, without hemorrhage or perforation: Secondary | ICD-10-CM

## 2024-04-05 DIAGNOSIS — K2970 Gastritis, unspecified, without bleeding: Secondary | ICD-10-CM

## 2024-04-05 DIAGNOSIS — K2289 Other specified disease of esophagus: Secondary | ICD-10-CM

## 2024-04-05 DIAGNOSIS — K3189 Other diseases of stomach and duodenum: Secondary | ICD-10-CM | POA: Diagnosis not present

## 2024-04-05 DIAGNOSIS — K209 Esophagitis, unspecified without bleeding: Secondary | ICD-10-CM

## 2024-04-05 DIAGNOSIS — K219 Gastro-esophageal reflux disease without esophagitis: Secondary | ICD-10-CM

## 2024-04-05 DIAGNOSIS — K2100 Gastro-esophageal reflux disease with esophagitis, without bleeding: Secondary | ICD-10-CM

## 2024-04-05 DIAGNOSIS — K449 Diaphragmatic hernia without obstruction or gangrene: Secondary | ICD-10-CM | POA: Diagnosis not present

## 2024-04-05 DIAGNOSIS — K319 Disease of stomach and duodenum, unspecified: Secondary | ICD-10-CM | POA: Diagnosis not present

## 2024-04-05 MED ORDER — SODIUM CHLORIDE 0.9 % IV SOLN: 500.0000 mL | Freq: Once | INTRAVENOUS | Status: DC

## 2024-04-05 MED ORDER — RABEPRAZOLE SODIUM 20 MG PO TBEC: 20.0000 mg | DELAYED_RELEASE_TABLET | Freq: Two times a day (BID) | ORAL | 6 refills | Status: DC

## 2024-04-05 NOTE — Progress Notes (Unsigned)
 A/o x 3, VSS, gd SR's, pleased with anesthesia, report to RN

## 2024-04-05 NOTE — Progress Notes (Signed)
 Patient will call GI office and schedule upper endoscopy for 4 months after checking her schedule. B.Vittorio Mohs RN.

## 2024-04-05 NOTE — Op Note (Signed)
 [facility] Patient Name: Terri Wiley Procedure Date: 04/05/2024 3:55 PM MRN: [id number] Endoscopist: Corliss Parish , MD, [id number] Age: 48 Referring MD:  Date of Birth: 09/12/1976 Gender: Female Account #: [id number] Procedure:                Upper GI endoscopy Indications:              Heartburn, Hiatal hernia Medicines:                Monitored Anesthesia Care Procedure:                Pre-Anesthesia Assessment:                           - Prior to the procedure, a History and Physical                            was performed, and patient medications and                            allergies were reviewed. The patient's tolerance of                            previous anesthesia was also reviewed. The risks                            and benefits of the procedure and the sedation                            options and risks were discussed with the patient.                            All questions were answered, and informed consent                            was obtained. Prior Anticoagulants: The patient has                            taken no anticoagulant or antiplatelet agents. ASA                            Grade Assessment: III - A patient with severe                            systemic disease. After reviewing the risks and                            benefits, the patient was deemed in satisfactory                            condition to undergo the procedure.                           After obtaining informed consent, the endoscope was  passed under direct vision. Throughout the                            procedure, the patient's blood pressure, pulse, and                            oxygen saturations were monitored continuously. The                            GIF [id number] #[id number] was introduced through the                            mouth, and advanced to the second part of duodenum.                            The upper GI endoscopy  was accomplished without                            difficulty. The patient tolerated the procedure. Scope In: Scope Out: Findings:                 No gross lesions were noted in the proximal                            esophagus and in the mid esophagus. Biopsies were                            taken with a cold forceps for histology.                           LA Grade A (one or more mucosal breaks less than 5                            mm, not extending between tops of 2 mucosal folds)                            esophagitis with no bleeding was found in the                            distal esophagus.                           The Z-line was irregular and was found 37 cm from                            the incisors.                           A 7 cm hiatal hernia was found (this is sliding and                            paraesophageal mixed picture). The hiatal narrowing  was 42 cm from the incisors. The Z-line was 37 cm                            from the incisors.                           One non-bleeding cratered gastric ulcer with a                            clean ulcer base (Forrest Class III) was found in                            the gastric body. The lesion was 10 mm in largest                            dimension.                           Multiple localized medium erosions with no bleeding                            and no stigmata of recent bleeding were found in                            the cardia and in the gastric body.                           Patchy mildly erythematous mucosa was found in the                            entire examined stomach. Biopsies were taken with a                            cold forceps for histology and Helicobacter pylori                            testing.                           No gross lesions were noted in the duodenal bulb,                            in the first portion of the duodenum and in the                             second portion of the duodenum. Complications:            No immediate complications. Estimated Blood Loss:     Estimated blood loss was minimal. Impression:               - No gross lesions in the proximal esophagus and in                            the mid esophagus. Biopsied.                           -  LA Grade A esophagitis with no bleeding.                           - Z-line irregular, 37 cm from the incisors.                           - 7 cm hiatal hernia.                           - Non-bleeding gastric ulcer with a clean ulcer                            base (Forrest Class III) in the body.                           - Erosive gastropathy with no bleeding and no                            stigmata of recent bleeding in the cardia and body.                           - Erythematous mucosa in the stomach throughout.                            Biopsied.                           - No gross lesions in the duodenal bulb, in the                            first portion of the duodenum and in the second                            portion of the duodenum. Recommendation:           - The patient will be observed post-procedure,                            until all discharge criteria are met.                           - Discharge patient to home.                           - Patient has a contact number available for                            emergencies. The signs and symptoms of potential                            delayed complications were discussed with the                            patient. Return to normal activities tomorrow.  Written discharge instructions were provided to the                            patient.                           - Restart Aciphex 20 mg twice daily (60/6).                           - Observe patient's clinical course.                           - Await pathology results.                           - Can discuss  follow-up with [facility] surgery                            versus another surgical group for consideration of                            hiatal hernia repair/fundoplication versus in                            setting of patient's weight the role of obesity                            surgical therapies.                           - Follow-up with healthy weight and wellness.                           - Repeat upper endoscopy in 4 months to check                            healing of gastric ulcer.                           - If patient continues to have issues, could                            consider use of Voquenza.                           - The findings and recommendations were discussed                            with the patient.                           - The findings and recommendations were discussed                            with the patient's family. Corliss Parish, MD 04/05/2024 4:32:34 PM

## 2024-04-05 NOTE — Patient Instructions (Signed)
 Please read handouts provided. Await pathology results. Begin Aciphex 20 mg twice daily. Follow-up with healthy weight and wellness. Repeat upper endoscopy in 4 months to check healing.   YOU HAD AN ENDOSCOPIC PROCEDURE TODAY AT THE [facility]:   Refer to the procedure report that was given to you for any specific questions about what was found during the examination.  If the procedure report does not answer your questions, please call your gastroenterologist to clarify.  If you requested that your care partner not be given the details of your procedure findings, then the procedure report has been included in a sealed envelope for you to review at your convenience later.  YOU SHOULD EXPECT: Some feelings of bloating in the abdomen. Passage of more gas than usual.  Walking can help get rid of the air that was put into your GI tract during the procedure and reduce the bloating. If you had a lower endoscopy (such as a colonoscopy or flexible sigmoidoscopy) you may notice spotting of blood in your stool or on the toilet paper. If you underwent a bowel prep for your procedure, you may not have a normal bowel movement for a few days.  Please Note:  You might notice some irritation and congestion in your nose or some drainage.  This is from the oxygen used during your procedure.  There is no need for concern and it should clear up in a day or so.  SYMPTOMS TO REPORT IMMEDIATELY:   Following upper endoscopy (EGD)  Vomiting of blood or coffee ground material  New chest pain or pain under the shoulder blades  Painful or persistently difficult swallowing  New shortness of breath  Fever of 100F or higher  Black, tarry-looking stools  For urgent or emergent issues, a gastroenterologist can be reached at any hour by calling [phone]. Do not use MyChart messaging for urgent concerns.    DIET:  We do recommend a small meal at first, but then you may proceed to your regular diet.   Drink plenty of fluids but you should avoid alcoholic beverages for 24 hours.  ACTIVITY:  You should plan to take it easy for the rest of today and you should NOT DRIVE or use heavy machinery until tomorrow (because of the sedation medicines used during the test).    FOLLOW UP: Our staff will call the number listed on your records the next business day following your procedure.  We will call around 7:15- 8:00 am to check on you and address any questions or concerns that you may have regarding the information given to you following your procedure. If we do not reach you, we will leave a message.     If any biopsies were taken you will be contacted by phone or by letter within the next 1-3 weeks.  Please call us at [phone] if you have not heard about the biopsies in 3 weeks.    SIGNATURES/CONFIDENTIALITY: You and/or your care partner have signed paperwork which will be entered into your electronic medical record.  These signatures attest to the fact that that the information above on your After Visit Summary has been reviewed and is understood.  Full responsibility of the confidentiality of this discharge information lies with you and/or your care-partner.

## 2024-04-05 NOTE — Progress Notes (Unsigned)
 Called to room to assist during endoscopic procedure.  Patient ID and intended procedure confirmed with present staff. Received instructions for my participation in the procedure from the performing physician.

## 2024-04-05 NOTE — Progress Notes (Unsigned)
 GASTROENTEROLOGY PROCEDURE H&P NOTE   Primary Care Physician: Shade Flood, MD  HPI: Terri Wiley is a 48 y.o. female who presents for EGD for evaluation of progressive GERD symptoms and known large HH.  Past Medical History:  Diagnosis Date   GERD (gastroesophageal reflux disease)    Hernia cerebri (HCC)    Papanicolaou smear of cervix with positive high risk human papilloma virus (HPV) test 06/24/2021   06/24/21 repeat pap in 1 year per ASCCP guidelines    Trichimoniasis 06/24/2021   Treated 06/24/21, POC___________   Past Surgical History:  Procedure Laterality Date   BREAST BIOPSY Left 06/2022   Fibroadenoma   HERNIA REPAIR     tubiligation     Current Outpatient Medications  Medication Sig Dispense Refill   acetaminophen (TYLENOL) 325 MG tablet Take 650 mg by mouth as needed. pain     albuterol (VENTOLIN HFA) 108 (90 Base) MCG/ACT inhaler Inhale 2 puffs into the lungs every 6 (six) hours as needed for wheezing or shortness of breath. 8 g 0   aspirin 81 MG EC tablet Take 81 mg by mouth daily. Swallow whole.     benzonatate (TESSALON) 100 MG capsule Take 1 capsule (100 mg total) by mouth 3 (three) times daily as needed for cough. 20 capsule 0   cetirizine (ZYRTEC) 10 MG tablet Take 1 tablet (10 mg total) by mouth daily. 30 tablet 0   FEROSUL 325 (65 Fe) MG tablet Take 325 mg by mouth daily.     furosemide (LASIX) 40 MG tablet Take 1 tablet (40 mg total) by mouth daily as needed for fluid or edema. 30 tablet 1   meloxicam (MOBIC) 7.5 MG tablet Take 1 tablet (7.5 mg total) by mouth daily. (Patient taking differently: Take 7.5 mg by mouth as needed.) 30 tablet 0   naproxen sodium (ALEVE) 220 MG tablet Take 220 mg by mouth as needed.     RABEprazole (ACIPHEX) 20 MG tablet Take 1 tablet (20 mg total) by mouth 2 (two) times daily. 60 tablet 1   Throat Lozenges (RA VITAMIN C COUGH DROPS MT) Use as directed 2 lozenges in the mouth or throat daily.     No current  facility-administered medications for this visit.    Current Outpatient Medications:    acetaminophen (TYLENOL) 325 MG tablet, Take 650 mg by mouth as needed. pain, Disp: , Rfl:    albuterol (VENTOLIN HFA) 108 (90 Base) MCG/ACT inhaler, Inhale 2 puffs into the lungs every 6 (six) hours as needed for wheezing or shortness of breath., Disp: 8 g, Rfl: 0   aspirin 81 MG EC tablet, Take 81 mg by mouth daily. Swallow whole., Disp: , Rfl:    benzonatate (TESSALON) 100 MG capsule, Take 1 capsule (100 mg total) by mouth 3 (three) times daily as needed for cough., Disp: 20 capsule, Rfl: 0   cetirizine (ZYRTEC) 10 MG tablet, Take 1 tablet (10 mg total) by mouth daily., Disp: 30 tablet, Rfl: 0   FEROSUL 325 (65 Fe) MG tablet, Take 325 mg by mouth daily., Disp: , Rfl:    furosemide (LASIX) 40 MG tablet, Take 1 tablet (40 mg total) by mouth daily as needed for fluid or edema., Disp: 30 tablet, Rfl: 1   meloxicam (MOBIC) 7.5 MG tablet, Take 1 tablet (7.5 mg total) by mouth daily. (Patient taking differently: Take 7.5 mg by mouth as needed.), Disp: 30 tablet, Rfl: 0   naproxen sodium (ALEVE) 220 MG tablet, Take 220 mg by  mouth as needed., Disp: , Rfl:    RABEprazole (ACIPHEX) 20 MG tablet, Take 1 tablet (20 mg total) by mouth 2 (two) times daily., Disp: 60 tablet, Rfl: 1   Throat Lozenges (RA VITAMIN C COUGH DROPS MT), Use as directed 2 lozenges in the mouth or throat daily., Disp: , Rfl:  Allergies  Allergen Reactions   Bee Venom Shortness Of Breath   Penicillins Hives   Hydrocodone Hives   Levaquin [Levofloxacin] Nausea And Vomiting and Rash   Family History  Problem Relation Age of Onset   Liver disease Mother    Hypertension Mother    Diabetes Mother    Aneurysm Father    Hypertension Father    Colon cancer Neg Hx    Stomach cancer Neg Hx    Esophageal cancer Neg Hx    Inflammatory bowel disease Neg Hx    Pancreatic cancer Neg Hx    Rectal cancer Neg Hx    Social History   Socioeconomic  History   Marital status: Divorced    Spouse name: Not on file   Number of children: Not on file   Years of education: Not on file   Highest education level: Bachelor's degree (e.g., BA, AB, BS)  Occupational History   Not on file  Tobacco Use   Smoking status: Never   Smokeless tobacco: Never  Vaping Use   Vaping status: Never Used  Substance and Sexual Activity   Alcohol use: No   Drug use: No   Sexual activity: Yes    Birth control/protection: Surgical    Comment: tubal  Other Topics Concern   Not on file  Social History Narrative   Not on file   Social Drivers of Health   Financial Resource Strain: Low Risk  (06/24/2023)   Overall Financial Resource Strain (CARDIA)    Difficulty of Paying Living Expenses: Not hard at all  Food Insecurity: Unknown (06/24/2023)   Hunger Vital Sign    Worried About Running Out of Food in the Last Year: Not on file    Ran Out of Food in the Last Year: Never true  Transportation Needs: No Transportation Needs (06/24/2023)   PRAPARE - Administrator, Civil Service (Medical): No    Lack of Transportation (Non-Medical): No  Physical Activity: Unknown (06/24/2023)   Exercise Vital Sign    Days of Exercise per Week: 0 days    Minutes of Exercise per Session: Not on file  Stress: No Stress Concern Present (06/24/2023)   [employer] of Occupational Health - Occupational Stress Questionnaire    Feeling of Stress : Not at all  Social Connections: Moderately Isolated (06/24/2023)   Social Connection and Isolation Panel [NHANES]    Frequency of Communication with Friends and Family: More than three times a week    Frequency of Social Gatherings with Friends and Family: Twice a week    Attends Religious Services: 1 to 4 times per year    Active Member of [employer] or Organizations: No    Attends Engineer, structural: Not on file    Marital Status: Divorced  Intimate Partner Violence: Unknown (04/03/2022)   Received from [employer]   HITS    Physically Hurt: Not on file    Insult or Talk Down To: Not on file    Threaten Physical Harm: Not on file    Scream or Curse: Not on file    Physical Exam: There were no vitals filed for  this visit. There is no height or weight on file to calculate BMI. GEN: NAD EYE: Sclerae anicteric ENT: MMM CV: Non-tachycardic GI: Soft, NT/ND NEURO:  Alert & Oriented x 3  Lab Results: No results for input(s): "WBC", "HGB", "HCT", "PLT" in the last 72 hours. BMET No results for input(s): "NA", "K", "CL", "CO2", "GLUCOSE", "BUN", "CREATININE", "CALCIUM" in the last 72 hours. LFT No results for input(s): "PROT", "ALBUMIN", "AST", "ALT", "ALKPHOS", "BILITOT", "BILIDIR", "IBILI" in the last 72 hours. PT/INR No results for input(s): "LABPROT", "INR" in the last 72 hours.   Impression / Plan: This is a 48 y.o.female who presents for EGD for evaluation of progressive GERD symptoms and known large HH.  The risks and benefits of endoscopic evaluation/treatment were discussed with the patient and/or family; these include but are not limited to the risk of perforation, infection, bleeding, missed lesions, lack of diagnosis, severe illness requiring hospitalization, as well as anesthesia and sedation related illnesses.  The patient's history has been reviewed, patient examined, no change in status, and deemed stable for procedure.  The patient and/or family is agreeable to proceed.    Corliss Parish, MD [facility] Office # [phone]

## 2024-04-06 ENCOUNTER — Telehealth: Payer: Self-pay

## 2024-04-06 NOTE — Telephone Encounter (Signed)
  Follow up Call-     04/05/2024    2:58 PM  Call back number  Post procedure Call Back phone  # [phone]  Permission to leave phone message Yes     Patient questions:  Do you have a fever, pain , or abdominal swelling? No. Pain Score  0 *  Have you tolerated food without any problems? Yes.    Have you been able to return to your normal activities? Yes.    Do you have any questions about your discharge instructions: Diet   No. Medications  No. Follow up visit  No.  Do you have questions or concerns about your Care? No.  Actions: * If pain score is 4 or above: No action needed, pain <4.

## 2024-04-07 ENCOUNTER — Telehealth: Payer: Self-pay

## 2024-04-07 ENCOUNTER — Other Ambulatory Visit (HOSPITAL_COMMUNITY): Payer: Self-pay

## 2024-04-07 NOTE — Telephone Encounter (Signed)
 Pharmacy Patient Advocate Encounter   Received notification from RX Request Messages that prior authorization for RABEprazole Sodium 20MG  dr tablets is required/requested.   Insurance verification completed.   The patient is insured through [employer] .   Per test claim: PA required; PA submitted to above mentioned insurance via CoverMyMeds Key/confirmation #/EOC [id number] Status is pending

## 2024-04-07 NOTE — Telephone Encounter (Signed)
 PA request has been Submitted. New Encounter has been or will be created for follow up. For additional info see Pharmacy Prior Auth telephone encounter from 04/07/2024.

## 2024-04-08 LAB — SURGICAL PATHOLOGY

## 2024-04-08 NOTE — Telephone Encounter (Signed)
 Pharmacy Patient Advocate Encounter  Received notification from [employer] that Prior Authorization for RABEprazole Sodium 20MG  dr tablets has been APPROVED from 04-07-2024 to 10-08-2024   PA #/Case ID/Reference #: [id number]

## 2024-04-12 ENCOUNTER — Encounter: Payer: Self-pay | Admitting: Family Medicine

## 2024-04-12 ENCOUNTER — Ambulatory Visit: Payer: Self-pay

## 2024-04-12 ENCOUNTER — Ambulatory Visit (INDEPENDENT_AMBULATORY_CARE_PROVIDER_SITE_OTHER): Admitting: Family Medicine

## 2024-04-12 VITALS — BP 122/90 | HR 73 | Temp 98.5°F | Ht 67.0 in

## 2024-04-12 DIAGNOSIS — R051 Acute cough: Secondary | ICD-10-CM | POA: Diagnosis not present

## 2024-04-12 DIAGNOSIS — R062 Wheezing: Secondary | ICD-10-CM | POA: Diagnosis not present

## 2024-04-12 DIAGNOSIS — H66006 Acute suppurative otitis media without spontaneous rupture of ear drum, recurrent, bilateral: Secondary | ICD-10-CM | POA: Diagnosis not present

## 2024-04-12 DIAGNOSIS — R052 Subacute cough: Secondary | ICD-10-CM

## 2024-04-12 LAB — POC COVID19 BINAXNOW: SARS Coronavirus 2 Ag: NEGATIVE

## 2024-04-12 MED ORDER — ALBUTEROL SULFATE HFA 108 (90 BASE) MCG/ACT IN AERS: 2.0000 | INHALATION_SPRAY | Freq: Four times a day (QID) | RESPIRATORY_TRACT | 0 refills | Status: DC | PRN

## 2024-04-12 MED ORDER — DOXYCYCLINE HYCLATE 100 MG PO CAPS: 100.0000 mg | ORAL_CAPSULE | Freq: Two times a day (BID) | ORAL | 0 refills | Status: AC

## 2024-04-12 MED ORDER — LEVOCETIRIZINE DIHYDROCHLORIDE 5 MG PO TABS: 5.0000 mg | ORAL_TABLET | Freq: Every evening | ORAL | 1 refills | Status: DC

## 2024-04-12 MED ORDER — BENZONATATE 100 MG PO CAPS: 100.0000 mg | ORAL_CAPSULE | Freq: Three times a day (TID) | ORAL | 0 refills | Status: DC | PRN

## 2024-04-12 NOTE — Progress Notes (Addendum)
 Acute Office Visit  Subjective:     Patient ID: Terri Wiley, female    DOB: 1976/01/11, 48 y.o.   MRN: [id number]  Chief Complaint  Patient presents with   Ear Pain    Productive cough, bilateral ear pain, shortness of breath, mild headache, throat pain since last Thursday. Currently treating with cough drops, salt rinse, and prescribed albuterol inhaler. Hasn't been around anyone they are aware of that is sick    HPI Patient is in today for evaluation of cough, sore throat, ear pain for the last 5 days. She is a Emergency planning/management officer. Has tried albuterol inhaler, Zyrtec, Tessalon Perles with mild temporary relief. Denies known sick contacts. Denies abdominal pain, nausea, vomiting, diarrhea, rash, fever, chills, other symptoms.  Medical hx as outlined below.  ROS Per HPI      Objective:    BP (!) 122/90   Pulse 73   Temp 98.5 F (36.9 C)   Ht 5\' 7"  (1.702 m)   LMP  (LMP Unknown)   SpO2 96%   BMI 47.77 kg/m    Physical Exam Vitals and nursing note reviewed.  Constitutional:      General: She is not in acute distress.    Appearance: She is ill-appearing.     Comments: Appears fatigued  HENT:     Head: Normocephalic and atraumatic.     Right Ear: External ear normal. A middle ear effusion is present. Tympanic membrane is erythematous and bulging.     Left Ear: External ear normal. A middle ear effusion is present. Tympanic membrane is erythematous and bulging.     Nose: Congestion present.     Mouth/Throat:     Mouth: Mucous membranes are moist.     Pharynx: Oropharynx is clear. No oropharyngeal exudate or posterior oropharyngeal erythema.     Comments: Oropharyngeal cobblestoning, hoarse voice  Eyes:     Extraocular Movements: Extraocular movements intact.  Cardiovascular:     Rate and Rhythm: Normal rate and regular rhythm.     Heart sounds: Normal heart sounds.  Pulmonary:     Effort: Pulmonary effort is normal. No respiratory distress.     Breath sounds: No  wheezing, rhonchi or rales.  Musculoskeletal:     Cervical back: Normal range of motion and neck supple.  Lymphadenopathy:     Cervical: No cervical adenopathy.  Skin:    General: Skin is warm and dry.  Neurological:     General: No focal deficit present.     Mental Status: She is alert and oriented to person, place, and time.    Results for orders placed or performed in visit on 04/12/24  POC COVID-19 BinaxNow  Result Value Ref Range   SARS Coronavirus 2 Ag Negative Negative        Assessment & Plan:   Recurrent acute suppurative otitis media without spontaneous rupture of tympanic membrane of both sides -     Doxycycline Hyclate; Take 1 capsule (100 mg total) by mouth 2 (two) times daily for 10 days.  Dispense: 20 capsule; Refill: 0  Acute cough -     Albuterol Sulfate HFA; Inhale 2 puffs into the lungs every 6 (six) hours as needed for wheezing or shortness of breath.  Dispense: 8 g; Refill: 0 -     Levocetirizine Dihydrochloride; Take 1 tablet (5 mg total) by mouth every evening.  Dispense: 90 tablet; Refill: 1 -     POC COVID-19 BinaxNow  Wheezing -     Albuterol  Sulfate HFA; Inhale 2 puffs into the lungs every 6 (six) hours as needed for wheezing or shortness of breath.  Dispense: 8 g; Refill: 0 -     Levocetirizine Dihydrochloride; Take 1 tablet (5 mg total) by mouth every evening.  Dispense: 90 tablet; Refill: 1  Subacute cough -     Benzonatate; Take 1 capsule (100 mg total) by mouth 3 (three) times daily as needed for cough.  Dispense: 20 capsule; Refill: 0     Meds ordered this encounter  Medications   doxycycline (VIBRAMYCIN) 100 MG capsule    Sig: Take 1 capsule (100 mg total) by mouth 2 (two) times daily for 10 days.    Dispense:  20 capsule    Refill:  0   albuterol (VENTOLIN HFA) 108 (90 Base) MCG/ACT inhaler    Sig: Inhale 2 puffs into the lungs every 6 (six) hours as needed for wheezing or shortness of breath.    Dispense:  8 g    Refill:  0    benzonatate (TESSALON) 100 MG capsule    Sig: Take 1 capsule (100 mg total) by mouth 3 (three) times daily as needed for cough.    Dispense:  20 capsule    Refill:  0   levocetirizine (XYZAL) 5 MG tablet    Sig: Take 1 tablet (5 mg total) by mouth every evening.    Dispense:  90 tablet    Refill:  1   COVID negative today Return if symptoms worsen or fail to improve.  Wellington Half, FNP

## 2024-04-12 NOTE — Addendum Note (Signed)
 Addended by: Wellington Half on: 04/12/2024 05:03 PM   Modules accepted: Orders

## 2024-04-12 NOTE — Telephone Encounter (Signed)
 Chief Complaint: cough Symptoms: cough, chest congestion, headache, bilateral ear aches (worse on left), mild SOB Frequency: x 3 days Pertinent Negatives: Patient denies fever, body aches, chills,  Disposition: [] ED /[] Urgent Care (no appt availability in office) / [x] Appointment(In office/virtual)/ []  [facility]/ [] Home Care/ [] Refused Recommended Disposition /[] [facility]/ []  Follow-up with PCP Additional Notes: Patient states she had an EGD done on 04/05/24. Patient speaking in full sentences, no labored breathing noted or audible wheezing. Patient states she has been using her inhaler and benzonatate. 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 Patient requesting to be seen when school gets out this afternoon. No availability with PCP or at [facility]; patient agreeable to be seen at [facility].   Copied from CRM [id number]. Topic: Clinical - Red Word Triage >> Apr 12, 2024  8:21 AM Elizebeth Brooking wrote: [facility] that prompted transfer to Nurse Triage: Patient called in stating she is having problems breathing, and is very congested Reason for Disposition  [1] MILD difficulty breathing (e.g., minimal/no SOB at rest, SOB with walking, pulse <100) AND [2] still present when not coughing  Answer Assessment - Initial Assessment Questions 1. ONSET: "When did the cough begin?"      X 3 days, 04/10/24.  2. SEVERITY: "How bad is the cough today?"      She states coughing is almost to the point of vomiting.  3. SPUTUM: "Describe the color of your sputum" (none, dry cough; clear, white, yellow, green)     Clear- Yellow.  4. HEMOPTYSIS: "Are you coughing up any blood?" If so ask: "How much?" (flecks, streaks, tablespoons, etc.)     Denies.  5. DIFFICULTY BREATHING: "Are you having difficulty breathing?" If Yes, ask: "How bad is it?" (e.g., mild, moderate, severe)    - MILD: No SOB at rest, mild  SOB with walking, speaks normally in sentences, can lie down, no retractions, pulse < 100.    - MODERATE: SOB at rest, SOB with minimal exertion and prefers to sit, cannot lie down flat, speaks in phrases, mild retractions, audible wheezing, pulse 100-120.    - SEVERE: Very SOB at rest, speaks in single words, struggling to breathe, sitting hunched forward, retractions, pulse > 120      Yes. She states when she is coughing, and wheezing sometimes after. Mild.  6. FEVER: "Do you have a fever?" If Yes, ask: "What is your temperature, how was it measured, and when did it start?"     Denies.  7. CARDIAC HISTORY: "Do you have any history of heart disease?" (e.g., heart attack, congestive heart failure)      Denies.  8. LUNG HISTORY: "Do you have any history of lung disease?"  (e.g., pulmonary embolus, asthma, emphysema)     Denies.  9. PE RISK FACTORS: "Do you have a history of blood clots?" (or: recent major surgery, recent prolonged travel, bedridden)     Denies.  10. OTHER SYMPTOMS: "Do you have any other symptoms?" (e.g., runny nose, wheezing, chest pain)       Headaches, bilateral earaches (left worse than right).  11. PREGNANCY: "Is there any chance you are pregnant?" "When was your last menstrual period?"       N/A.  12. TRAVEL: "Have you traveled out of the country in the last month?" (e.g., travel history, exposures)       Denies,  works around children.  Protocols used: Cough - Acute Productive-A-AH

## 2024-04-14 ENCOUNTER — Encounter: Payer: Self-pay | Admitting: Gastroenterology

## 2024-06-06 ENCOUNTER — Encounter (HOSPITAL_COMMUNITY): Payer: Self-pay

## 2024-06-08 ENCOUNTER — Encounter (HOSPITAL_COMMUNITY)

## 2024-06-08 DIAGNOSIS — Z1231 Encounter for screening mammogram for malignant neoplasm of breast: Secondary | ICD-10-CM

## 2024-06-14 ENCOUNTER — Emergency Department (HOSPITAL_COMMUNITY)

## 2024-06-14 ENCOUNTER — Emergency Department (HOSPITAL_COMMUNITY)
Admission: EM | Admit: 2024-06-14 | Discharge: 2024-06-15 | Disposition: A | Discharge status: Home / Self Care | Attending: Emergency Medicine | Admitting: Emergency Medicine

## 2024-06-14 ENCOUNTER — Other Ambulatory Visit: Payer: Self-pay

## 2024-06-14 ENCOUNTER — Encounter (HOSPITAL_COMMUNITY): Payer: Self-pay

## 2024-06-14 DIAGNOSIS — Z7982 Long term (current) use of aspirin: Secondary | ICD-10-CM | POA: Diagnosis not present

## 2024-06-14 DIAGNOSIS — I10 Essential (primary) hypertension: Secondary | ICD-10-CM | POA: Diagnosis not present

## 2024-06-14 DIAGNOSIS — K449 Diaphragmatic hernia without obstruction or gangrene: Secondary | ICD-10-CM | POA: Diagnosis not present

## 2024-06-14 DIAGNOSIS — R0689 Other abnormalities of breathing: Secondary | ICD-10-CM | POA: Diagnosis not present

## 2024-06-14 DIAGNOSIS — D649 Anemia, unspecified: Secondary | ICD-10-CM | POA: Diagnosis not present

## 2024-06-14 DIAGNOSIS — D259 Leiomyoma of uterus, unspecified: Secondary | ICD-10-CM | POA: Diagnosis not present

## 2024-06-14 DIAGNOSIS — R1110 Vomiting, unspecified: Secondary | ICD-10-CM | POA: Diagnosis not present

## 2024-06-14 LAB — CBC WITH DIFFERENTIAL/PLATELET
Abs Immature Granulocytes: 0.02 10*3/uL (ref 0.00–0.07)
Basophils Absolute: 0 10*3/uL (ref 0.0–0.1)
Basophils Relative: 0 %
Eosinophils Absolute: 0.1 10*3/uL (ref 0.0–0.5)
Eosinophils Relative: 2 %
HCT: 26.6 % — ABNORMAL LOW (ref 36.0–46.0)
Hemoglobin: 7.7 g/dL — ABNORMAL LOW (ref 12.0–15.0)
Immature Granulocytes: 0 %
Lymphocytes Relative: 27 %
Lymphs Abs: 1.6 10*3/uL (ref 0.7–4.0)
MCH: 21.6 pg — ABNORMAL LOW (ref 26.0–34.0)
MCHC: 28.9 g/dL — ABNORMAL LOW (ref 30.0–36.0)
MCV: 74.7 fL — ABNORMAL LOW (ref 80.0–100.0)
Monocytes Absolute: 0.3 10*3/uL (ref 0.1–1.0)
Monocytes Relative: 5 %
Neutro Abs: 3.9 10*3/uL (ref 1.7–7.7)
Neutrophils Relative %: 66 %
Platelets: 278 10*3/uL (ref 150–400)
RBC: 3.56 MIL/uL — ABNORMAL LOW (ref 3.87–5.11)
RDW: 17.2 % — ABNORMAL HIGH (ref 11.5–15.5)
WBC: 5.9 10*3/uL (ref 4.0–10.5)
nRBC: 0 % (ref 0.0–0.2)

## 2024-06-14 LAB — URINALYSIS, ROUTINE W REFLEX MICROSCOPIC
Bilirubin Urine: NEGATIVE
Glucose, UA: NEGATIVE mg/dL
Hgb urine dipstick: NEGATIVE
Ketones, ur: NEGATIVE mg/dL
Leukocytes,Ua: NEGATIVE
Nitrite: NEGATIVE
Protein, ur: NEGATIVE mg/dL
Specific Gravity, Urine: 1.013 (ref 1.005–1.030)
pH: 6 (ref 5.0–8.0)

## 2024-06-14 LAB — COMPREHENSIVE METABOLIC PANEL WITH GFR
ALT: 14 U/L (ref 0–44)
AST: 13 U/L — ABNORMAL LOW (ref 15–41)
Albumin: 3.1 g/dL — ABNORMAL LOW (ref 3.5–5.0)
Alkaline Phosphatase: 92 U/L (ref 38–126)
Anion gap: 9 (ref 5–15)
BUN: 8 mg/dL (ref 6–20)
CO2: 24 mmol/L (ref 22–32)
Calcium: 8.6 mg/dL — ABNORMAL LOW (ref 8.9–10.3)
Chloride: 108 mmol/L (ref 98–111)
Creatinine, Ser: 0.72 mg/dL (ref 0.44–1.00)
GFR, Estimated: >60 mL/min (ref >60)
Glucose, Bld: 107 mg/dL — ABNORMAL HIGH (ref 70–99)
Potassium: 3.1 mmol/L — ABNORMAL LOW (ref 3.5–5.1)
Sodium: 141 mmol/L (ref 135–145)
Total Bilirubin: 0.5 mg/dL (ref 0.0–1.2)
Total Protein: 7.3 g/dL (ref 6.5–8.1)

## 2024-06-14 LAB — TROPONIN I (HIGH SENSITIVITY): Troponin I (High Sensitivity): <2 ng/L (ref <18)

## 2024-06-14 LAB — LIPASE, BLOOD: Lipase: 34 U/L (ref 11–51)

## 2024-06-14 MED ORDER — POTASSIUM CHLORIDE CRYS ER 20 MEQ PO TBCR
40.0000 meq | EXTENDED_RELEASE_TABLET | Freq: Once | ORAL | Status: AC
  Administered 2024-06-15: 40 meq via ORAL
  Filled 2024-06-14: qty 2

## 2024-06-14 MED ORDER — IOHEXOL 350 MG/ML SOLN
100.0000 mL | Freq: Once | INTRAVENOUS | Status: AC | PRN
  Administered 2024-06-14: 100 mL via INTRAVENOUS

## 2024-06-14 NOTE — ED Provider Notes (Signed)
 [name] [facility] AT [facility] Provider Note   CSN: [id number] Arrival date & time: 06/14/24  1724     Patient presents with: Emesis   Terri Wiley is a 48 y.o. female with history of hiatal hernia, anemia, peptic ulcer disease presents emerged from today for evaluation of regurgitation symptoms.  Patient reports that her hiatal hernia is causing her to not be able to keep down much food.  She reports that foods feel gets stuck and she will start to cough which causes her to regurgitate.  She reports that she will feel a tickle in her throat.  She is not having any melena or hematochezia.  She has been trying ibuprofen for symptoms.  She denies any abdominal pain, chest pain, shortness of breath, diarrhea, constipation.  She reports that she went on an ice coffee today at [facility] and few days after drinking it she could feel it stuck in her chest and started to feel the tickle in her throat and coughed and regurgitated some of the coffee.  She still been urinating normally however.  She has followed up with general surgery and GI for this.  She did endoscopy a few months ago and scheduled for a new endoscopy in the next few months.  She has already seen surgery for this however they would like her to lose weight before repairing.  Emesis Associated symptoms: cough   Associated symptoms: no abdominal pain, no chills and no fever        Prior to Admission medications   Medication Sig Start Date End Date Taking? Authorizing Provider  acetaminophen  (TYLENOL ) 325 MG tablet Take 650 mg by mouth as needed. pain    [provider]  albuterol  (VENTOLIN  HFA) 108 (90 Base) MCG/ACT inhaler Inhale 2 puffs into the lungs every 6 (six) hours as needed for wheezing or shortness of breath. 04/12/24   Alvia Corean CROME, FNP  aspirin  81 MG EC tablet Take 81 mg by mouth daily. Swallow whole.    [provider]  benzonatate  (TESSALON ) 100 MG capsule Take  1 capsule (100 mg total) by mouth 3 (three) times daily as needed for cough. 04/12/24   Alvia Corean CROME, FNP  FEROSUL 325 (65 Fe) MG tablet Take 325 mg by mouth daily. 12/02/20   [provider]  furosemide  (LASIX ) 40 MG tablet Take 1 tablet (40 mg total) by mouth daily as needed for fluid or edema. 06/26/23   Levora Reyes SAUNDERS, MD  levocetirizine (XYZAL ) 5 MG tablet Take 1 tablet (5 mg total) by mouth every evening. 04/12/24   Alvia Corean CROME, FNP  meloxicam  (MOBIC ) 7.5 MG tablet Take 1 tablet (7.5 mg total) by mouth daily. Patient taking differently: Take 7.5 mg by mouth as needed. 07/23/22   Levora Reyes SAUNDERS, MD  naproxen sodium (ALEVE) 220 MG tablet Take 220 mg by mouth as needed.    [provider]  RABEprazole  (ACIPHEX ) 20 MG tablet TAKE 1 TABLET BY MOUTH TWICE DAILY 04/07/24   Mansouraty, Gabriel Jr., MD  Throat Lozenges (RA VITAMIN C COUGH DROPS MT) Use as directed 2 lozenges in the mouth or throat daily.    [provider]    Allergies: Bee venom, Penicillins, Hydrocodone, and Levaquin  [levofloxacin ]    Review of Systems  Constitutional:  Negative for chills and fever.  Respiratory:  Positive for cough. Negative for shortness of breath.   Cardiovascular:  Negative for chest pain.  Gastrointestinal:  Positive for vomiting. Negative for  abdominal pain and nausea.  Genitourinary:  Negative for dysuria.    Updated Vital Signs BP 123/78   Pulse 66   Temp (!) 97.1 F (36.2 C)   Resp 13   Ht 5' 9 (1.753 m)   Wt 132.9 kg   SpO2 96%   BMI 43.27 kg/m   Physical Exam Constitutional:      General: She is not in acute distress.    Appearance: She is not toxic-appearing.  HENT:     Mouth/Throat:     Mouth: Mucous membranes are moist.   Eyes:     General: No scleral icterus.   Cardiovascular:     Rate and Rhythm: Normal rate.     Pulses: Normal pulses.  Pulmonary:     Effort: Pulmonary effort is normal. No respiratory distress.     Breath  sounds: No stridor.  Abdominal:     Palpations: Abdomen is soft.     Tenderness: There is no abdominal tenderness. There is no guarding or rebound.   Skin:    General: Skin is warm and dry.   Neurological:     Mental Status: She is alert.     (all labs ordered are listed, but only abnormal results are displayed) Labs Reviewed  COMPREHENSIVE METABOLIC PANEL WITH GFR - Abnormal; Notable for the following components:      Result Value   Potassium 3.1 (*)    Glucose, Bld 107 (*)    Calcium 8.6 (*)    Albumin 3.1 (*)    AST 13 (*)    All other components within normal limits  CBC WITH DIFFERENTIAL/PLATELET - Abnormal; Notable for the following components:   RBC 3.56 (*)    Hemoglobin 7.7 (*)    HCT 26.6 (*)    MCV 74.7 (*)    MCH 21.6 (*)    MCHC 28.9 (*)    RDW 17.2 (*)    All other components within normal limits  LIPASE, BLOOD  URINALYSIS, ROUTINE W REFLEX MICROSCOPIC  TROPONIN I (HIGH SENSITIVITY)    EKG: EKG Interpretation Date/Time:  Tuesday June 14 2024 21:17:50 EDT Ventricular Rate:  57 PR Interval:  179 QRS Duration:  96 QT Interval:  494 QTC Calculation: 481 R Axis:   46  Text Interpretation: Sinus rhythm Low voltage, precordial leads Borderline T abnormalities, anterior leads Confirmed by Griselda Norris [phone]) on 06/15/2024 5:01:19 PM  Radiology: CT Angio Chest/Abd/Pel for Dissection W and/or Wo Contrast Result Date: 06/14/2024 CLINICAL DATA:  Emesis, difficulty breathing, hiatal hernia EXAM: CT ANGIOGRAPHY CHEST, ABDOMEN AND PELVIS TECHNIQUE: Non-contrast CT of the chest was initially obtained. Multidetector CT imaging through the chest, abdomen and pelvis was performed using the standard protocol during bolus administration of intravenous contrast. Multiplanar reconstructed images and MIPs were obtained and reviewed to evaluate the vascular anatomy. RADIATION DOSE REDUCTION: This exam was performed according to the departmental dose-optimization program  which includes automated exposure control, adjustment of the mA and/or kV according to patient size and/or use of iterative reconstruction technique. CONTRAST:  OMNIPAQUE  IOHEXOL  350 MG/ML SOLN COMPARISON:  06/14/2024, 07/13/2023 FINDINGS: CTA CHEST FINDINGS Cardiovascular: No evidence of thoracic aortic aneurysm or dissection. The heart is unremarkable without pericardial effusion. Mediastinum/Nodes: No enlarged mediastinal, hilar, or axillary lymph nodes. Thyroid  gland, trachea, and esophagus demonstrate no significant findings. There is a large hiatal hernia, with the majority of the stomach in an intrathoracic location, unchanged since prior exams. Lungs/Pleura: Left lower lobe compressive atelectasis. No airspace disease, effusion, or  pneumothorax. Central airways are patent. Musculoskeletal: No acute or destructive bony abnormalities. Reconstructed images demonstrate no additional findings. Review of the MIP images confirms the above findings. CTA ABDOMEN AND PELVIS FINDINGS VASCULAR Aorta: Normal caliber aorta without aneurysm, dissection, vasculitis or significant stenosis. Celiac: Patent without evidence of aneurysm, dissection, vasculitis or significant stenosis. SMA: Patent without evidence of aneurysm, dissection, vasculitis or significant stenosis. Renals: Both renal arteries are patent without evidence of aneurysm, dissection, vasculitis, fibromuscular dysplasia or significant stenosis. IMA: Patent without evidence of aneurysm, dissection, vasculitis or significant stenosis. Inflow: Patent without evidence of aneurysm, dissection, vasculitis or significant stenosis. Veins: No obvious venous abnormality within the limitations of this arterial phase study. Review of the MIP images confirms the above findings. NON-VASCULAR Hepatobiliary: No focal liver abnormality is seen. No gallstones, gallbladder wall thickening, or biliary dilatation. Pancreas: Unremarkable. No pancreatic ductal dilatation or  surrounding inflammatory changes. Spleen: Normal in size without focal abnormality. Adrenals/Urinary Tract: 5 mm nonobstructing left renal calculus. No right-sided calculi. The kidneys enhance normally. The adrenals and bladder are unremarkable. Stomach/Bowel: No bowel obstruction or ileus. Normal appendix right lower quadrant. No bowel wall thickening or inflammatory change. Lymphatic: No pathologic adenopathy. Reproductive: Heterogeneous enlarged uterus consistent with multiple fibroids. No adnexal masses. Other: No free fluid or free intraperitoneal gas. No abdominal wall hernia. Musculoskeletal: No acute or destructive bony abnormalities. Reconstructed images demonstrate no additional findings. Review of the MIP images confirms the above findings. IMPRESSION: Vascular: 1. No evidence of thoracoabdominal aortic aneurysm or dissection. Nonvascular: 1. Large hiatal hernia, with compressive atelectasis of the left lower lobe. 2. Nonobstructing 5 mm left renal calculus. 3. Fibroid uterus. Electronically Signed   By: Ozell Daring M.D.   On: 06/14/2024 22:08   DG Chest 2 View Result Date: 06/14/2024 CLINICAL DATA:  chest pain, SOB EXAM: CHEST - 2 VIEW COMPARISON:  None available. FINDINGS: Streaky atelectasis in the left lung base. No focal airspace consolidation, pleural effusion, or pneumothorax. No cardiomegaly. Large hiatal hernia. No acute fracture or destructive lesion. Dextrocurvature of the thoracolumbar spine. Multilevel degenerative disc disease of the spine. IMPRESSION: No acute cardiopulmonary abnormality. Electronically Signed   By: Rogelia Myers M.D.   On: 06/14/2024 18:08   Procedures   Medications Ordered in the ED  potassium chloride  SA (KLOR-CON  M) CR tablet 40 mEq (has no administration in time range)  iohexol  (OMNIPAQUE ) 350 MG/ML injection 100 mL (100 mLs Intravenous Contrast Given 06/14/24 2141)                               Medical Decision Making Amount and/or Complexity of Data  Reviewed Labs: ordered. Radiology: ordered.  Risk Prescription drug management.   48 y.o. female presents to the ER for evaluation of vomiting, globus sensation. Differential diagnosis includes but is not limited to ACS/MI, dissection, food bolus, esophageal stricture, hiatal hernia. Vital signs temperature 97.1 otherwise unremarkable. Physical exam as noted above.   Patient with large hiatal hernia however is having some regurgitation of food.  Could consider food bolus.  Will order dissection study to rule out any dissection, food bolus, etc.  I independently reviewed and interpreted the patient's labs.  Troponin less than 2, given symptoms going on for days do not need repeat.  Urinalysis unremarkable.  CBC shows hemoglobin of 7.7, patient known anemia does appear to be lower than baseline.  No leukocytosis.  Potassium 3.1, glucose at 107, calcium at 8.6, albumin of  3.1 with an AST of 13.  Otherwise, no electrolyte or LFT abnormality.  Lipase within normal limits.  CXR No acute cardiopulmonary abnormality.   CT dissection study shows  Vascular: 1. No evidence of thoracoabdominal aortic aneurysm or dissection. Nonvascular: 1. Large hiatal hernia, with compressive atelectasis of the left lower lobe. 2. Nonobstructing 5 mm left renal calculus. 3. Fibroid uterus. Per radiologist's interpretation.    EKG reviewed and interpreted by my attending and read as Sinus rhythm Low voltage, precordial leads Borderline T abnormalities, anterior leads.   I discussed case with my attending, the hiatal hernia with left lower lung compression has been present as early as April 2022 on imaging. She has known anemia and she was alerted of her blood count. There is no emergent finding and she will need follow up with GI outpatient.   On reevaluation, patient sleeping does not appear in any acute distress.  Vital signs stable.  I have replenished her potassium.  The patient's been eating a lot of acidic foods  which is coffee, juices, fruit.  We discussed that this can worsen acid reflux which can then worsen her reflux symptoms.  She has also been taking ibuprofen with reported history of ulcers.  We do long discussion about not taking ibuprofen amaurosis can worsen also her GERD symptoms and with her anemia.  Will add on Carafate  for the patient.  Recommend still to be compliant with the rest of her medications.  I included information on what foods to avoid to help with her symptoms.  We discussed keeping a food journal as well.  Patient had similar complaints at her office visit on February 23, 2024 with the regurgitation and similar presentation to what she is having now.  I discussed this with the patient and family for him in the room.  The family member and patient agree that this is her symptom however she was feeling tired of her symptoms so presented to the emergency department.  The symptoms are likely due to patient's known large hiatal hernia.  I do not believe there is any cardiac etiology given reassuring workup.  She stable for discharge home with outpatient follow-up.  We discussed the results of the labs/imaging. The plan is take new medications, follow up with GI, change dietary habits. We discussed strict return precautions and red flag symptoms. The patient verbalized their understanding and agrees to the plan. The patient is stable and being discharged home in good condition.  I discussed this case with my attending physician who cosigned this note including patient's presenting symptoms, physical exam, and planned diagnostics and interventions. Attending physician stated agreement with plan or made changes to plan which were implemented.   Portions of this report may have been transcribed using voice recognition software. Every effort was made to ensure accuracy; however, inadvertent computerized transcription errors may be present.    Final diagnoses:  Hiatal hernia    ED Discharge  Orders          Ordered    sucralfate  (CARAFATE ) 1 g tablet  3 times daily with meals & bedtime        06/15/24 0000               Bernis Ernst, PA-C 06/17/24 1935    Francesca Elsie CROME, MD 06/20/24 [phone]

## 2024-06-14 NOTE — ED Notes (Signed)
 ED Provider at bedside.

## 2024-06-14 NOTE — ED Notes (Signed)
 Patient transported to CT

## 2024-06-14 NOTE — Discharge Instructions (Addendum)
 You were seen in the ER today for evaluation of your symptoms.  This is likely coming from your hernia.  I am going to place you on a medication called Carafate .  Please take as prescribed.  Make that you follow-up with your GI provider.  We discussed food changes to make, please make sure you adhere to this.  If you have any concerns, new or worsening symptoms, please return to the nearest emerged part for evaluation.  Contact a health care provider if: Your symptoms are not controlled with medicines or lifestyle changes. You are having trouble swallowing. You have coughing or wheezing that will not go away. Your pain is getting worse. Your pain spreads to your arms, neck, jaw, teeth, or back. You feel nauseous or you vomit. Get help right away if: You have shortness of breath. You vomit blood. You have bright red blood in your stools. You have black, tarry stools. These symptoms may be an emergency. Get help right away. Call 911. Do not wait to see if the symptoms will go away. Do not drive yourself to the hospital.

## 2024-06-14 NOTE — ED Triage Notes (Signed)
 Pt arrived via POV c/o emesis, and hiatal hernia repair. Pt also reports it feels like phlegm is getting caught in her throat and sometimes it feels like she cannot breathe. Pt also reports her hernia is so large that it is collapsing her left lower lobe of her lung. Pt endorsing SOB.

## 2024-06-15 DIAGNOSIS — K449 Diaphragmatic hernia without obstruction or gangrene: Secondary | ICD-10-CM | POA: Diagnosis not present

## 2024-06-15 MED ORDER — SUCRALFATE 1 G PO TABS: 1.0000 g | ORAL_TABLET | Freq: Three times a day (TID) | ORAL | 0 refills | Status: DC

## 2024-06-17 ENCOUNTER — Encounter: Payer: Self-pay | Admitting: Gastroenterology

## 2024-06-18 NOTE — Telephone Encounter (Signed)
 Patty, I am sorry to hear about her symptoms, but I understand the discomfort she may be experiencing and that trying to get her hiatal hernia repair does make sense.  The difficulty with her situation is that she likely would have further improvement if combined hiatal hernia repair and bariatric surgery were performed together, which she has shortness about pursuing. Please let patient know that I will discuss the case with Dr. Tanda and with Dr. Stevie. You may place a referral into the system. It is possible, that they will want the patient to be evaluated with another surgical group for another opinion, so once I know patient with the 2 of them we will have more clarity.  Hopefully I can have an answer for her by some point at the end of next week. Thanks. GM

## 2024-06-20 ENCOUNTER — Encounter: Payer: Self-pay | Admitting: Gastroenterology

## 2024-06-20 ENCOUNTER — Ambulatory Visit: Admitting: Family Medicine

## 2024-06-21 NOTE — Telephone Encounter (Signed)
 I spoke with Dr. Stevie and Dr. Tanda. Both feel patient will have high risk of potential recurrence with large hiatal hernia repairs in patients who have elevated BMI's. Dr. Tanda, is happy to meet the patient to discuss things further with her and come up with a plan if she would like to be seen at [facility]. Otherwise if she wants to be seen elsewhere, we can refer her to one of the quaternary centers for further evaluation from a surgical standpoint. Will forward this to my team to reach out to the patient and let her know and see what she would like to do.   Aloha Finner, MD [facility] Office # [phone]

## 2024-06-23 ENCOUNTER — Telehealth: Payer: Self-pay | Admitting: Gastroenterology

## 2024-06-23 ENCOUNTER — Ambulatory Visit: Admitting: Family Medicine

## 2024-06-23 VITALS — BP 124/90 | HR 74 | Temp 98.2°F | Resp 17 | Ht 69.0 in | Wt 302.4 lb

## 2024-06-23 DIAGNOSIS — D509 Iron deficiency anemia, unspecified: Secondary | ICD-10-CM

## 2024-06-23 DIAGNOSIS — R0982 Postnasal drip: Secondary | ICD-10-CM

## 2024-06-23 DIAGNOSIS — K449 Diaphragmatic hernia without obstruction or gangrene: Secondary | ICD-10-CM | POA: Diagnosis not present

## 2024-06-23 DIAGNOSIS — E876 Hypokalemia: Secondary | ICD-10-CM

## 2024-06-23 DIAGNOSIS — R051 Acute cough: Secondary | ICD-10-CM

## 2024-06-23 DIAGNOSIS — K259 Gastric ulcer, unspecified as acute or chronic, without hemorrhage or perforation: Secondary | ICD-10-CM | POA: Diagnosis not present

## 2024-06-23 DIAGNOSIS — E611 Iron deficiency: Secondary | ICD-10-CM | POA: Diagnosis not present

## 2024-06-23 DIAGNOSIS — R062 Wheezing: Secondary | ICD-10-CM

## 2024-06-23 DIAGNOSIS — R059 Cough, unspecified: Secondary | ICD-10-CM

## 2024-06-23 LAB — BASIC METABOLIC PANEL WITH GFR
BUN: 13 mg/dL (ref 6–23)
CO2: 27 meq/L (ref 19–32)
Calcium: 9.1 mg/dL (ref 8.4–10.5)
Chloride: 107 meq/L (ref 96–112)
Creatinine, Ser: 0.71 mg/dL (ref 0.40–1.20)
GFR: 99.41 mL/min (ref >60.00)
Glucose, Bld: 93 mg/dL (ref 70–99)
Potassium: 3.9 meq/L (ref 3.5–5.1)
Sodium: 141 meq/L (ref 135–145)

## 2024-06-23 LAB — CBC
HCT: 27.1 % — ABNORMAL LOW (ref 36.0–46.0)
Hemoglobin: 8.3 g/dL — ABNORMAL LOW (ref 12.0–15.0)
MCHC: 30.8 g/dL (ref 30.0–36.0)
MCV: 69.4 fl — ABNORMAL LOW (ref 78.0–100.0)
Platelets: 321 10*3/uL (ref 150.0–400.0)
RBC: 3.9 Mil/uL (ref 3.87–5.11)
RDW: 18.4 % — ABNORMAL HIGH (ref 11.5–15.5)
WBC: 5.9 10*3/uL (ref 4.0–10.5)

## 2024-06-23 NOTE — Telephone Encounter (Signed)
 Extent of discussion about the patient offline with primary care provider and with surgical colleagues.  Although it would be ideal for a bariatric surgery and hiatal hernia repair to be done to minimize the risk of hiatal hernia failure which could make things even more difficult in future, we may not have that opportunity depending on things. Our surgeons are happy to meet the patient or if the patient wants to be referred to a quaternary center then we can arrange that as well.  My pod C nurses will work on enacting the plan below: 1) reach out to patient and find out if she wants to see Dr. Tanda or if she wants to be seen at [facility] or [facility] for consideration of surgical repair of hiatal hernia -then document and place referral as necessary 2) increase oral iron to twice daily 3) urgent referral to hematology for IV iron infusions in setting of iron deficiency 4) add on iron/TIBC/ferritin/B12/folate to most recent labs or have patient come in to have those labs drawn 5) patient needs colonoscopy for completion of workup of iron deficiency anemia (even though she had a 2023 negative Cologuard).  It is a recommendation, should she decide she does not want that then that is her right as well but to optimize and make sure nothing else is being missed it is a recommendation strongly 6) barium swallow to help our surgeons understand hiatal hernia in motion 7) add Carafate  1 g twice daily (liquid slurry ideal if not tablets and can crush/dissolve) for next 2 months (if tablets then 60/1) 8) repeat CBC in 2 to 4 weeks    Aloha Finner, MD [facility] # [phone]

## 2024-06-23 NOTE — Patient Instructions (Addendum)
 Keep follow up with general surgery as planned next week to discuss the hiatal hernia and options.  Try flonase nasal spray to see if that will help postnasal drip and some of the upper airway congestion, but I will also order a chest x-ray to be performed about regular medication today to make sure that we do not see any congestion or pneumonia.  This would be unlikely.  I am also concerned about the lower hemoglobin at your ER visit.  With the history of a gastric ulcer and some upper abdominal pain, that could be a possible contributor.  I will recheck the blood counts today along with your potassium level and can discuss plan later today including possible discussion with your gastroenterologist.  Continue the Aciphex  and Carafate  for now. If any new or worsening symptoms proceed to the emergency room.  Depending on the labs today we will discuss a follow-up plan.  Hang in there!  [facility] or xray: Walk in 8:30-4:30 during weekdays, no appointment needed [address]  [place], [place] [address]

## 2024-06-23 NOTE — Progress Notes (Signed)
 Subjective:  Patient ID: Terri Wiley, female    DOB: 1976-05-18  Age: 48 y.o. MRN: [id number]  CC:  Chief Complaint  Patient presents with   Hospitalization Follow-up    Pt went to the hospital the other day, having trouble with back and chest pains, trouble getting a deep breath, hiatal hernia involvement suspected, pt was told her hemoglobin is low as well as potassium    HPI Terri Wiley presents for   ED follow-up.  Hiatal hernia ER evaluation noted from June 17.  Some concern with food feeling stuck, causing her to regurgitate.  Noted after drinking ice coffee.  She has a history of a large hiatal hernia.  Troponin less than 2, urinalysis unremarkable, history of known anemia, with hemoglobin of 7.7.  No leukocytosis.  Hypokalemic Potassium 3.1.  Glucose 107, calcium 8.6, albumin 3.1, AST 13.  Lipase normal.  No acute abnormality on chest x-ray.  CT dissection study without sign of aortic aneurysm or dissection.  Large hiatal hernia with compressive atelectasis of the left lower lobe.  Nonobstructing 5 mm left renal calculus and fibroid uterus.  EKG with sinus rhythm, low voltage in precordial leads with borderline T abnormalities anterior leads.  Given these findings plan for GI outpatient follow-up.  Potassium was replenished.  Advised to decrease acidic foods as well as cautioned on use of ibuprofen with history of ulcers.  Carafate  was added.  Notes reviewed from her discussion with gastroenterology, Dr. Wilhelmenia.  She has been evaluated by general surgery previously, had been recommended to undergo hiatal hernia repair along with bariatric surgery.  Option to meet with different surgeon for second opinion, but may have same recommendations for proceeding with bariatric treatment along with hiatal hernia due to potential for recurrence.  Also given option to meet with other surgical group.  Will be meeting with Dr. Tanda on July 2nd.   Since leaving ER. Still feeling similar  symptoms, coughing up some phlegm at times. Clear. Low grade fever? 98-99.  Soreness into mid back. No dyspnea. Congestion feeling in back of throat with some postnasal drip. Taking xyzal , saline nasal spray.   In regards to her anemia, we discussed this a few months ago, advised to increase her iron to twice per day and follow-up for repeat lab visit within 1 week. Stable on repeat testing at that time - taking iron pills 2 per day.    CBC had not been repeated until recent ER visit, with hemoglobin 7.7 on the 17th, lower than her previous readings from 9.7 on 03/18/2024.  Most recent testing was microcytic with MCV 74. Iron testing in March indicated deficiency with iron level 27, 7% sat, ferritin of 7. Endoscopy April 8, reactive gastropathy. Nonbleeding gastric ulcer.  Mild vascular congestion within the esophageal mucosa, reflux esophagitis.  On stool softener for constipation with iron - working ok.  No melena/hematochezia  Dark urine at times. No blood in urine. Frequency at night, no dysuria.  Normal urinalysis without blood, bili on 6/17, no sign of infection.  Episodic lightheadedness  - 2 or 3 episodes, past few weeks. No syncope. Funny feeling in chest, upper chest with cough.  Same pain in back in back since ER visit.  Slight abdominal pain - off and on for past week or two.  Usually on otc potassium supplement.  Has been taking carafate  QID in addition to her Aciphex .   History Patient Active Problem List   Diagnosis Date Noted   Morbid obesity (HCC)  02/23/2024   Non-cardiac chest pain 02/23/2024   Advised about management of weight 02/23/2024   Bunion of great toe 07/09/2023   Folic acid deficiency (non anemic) 07/09/2023   Heart murmur 07/09/2023   Herniated lumbar intervertebral disc 07/09/2023   Lower back pain 07/09/2023   Morbid (severe) obesity due to excess calories (HCC) 07/09/2023   Scoliosis concern 07/09/2023   Gastroesophageal reflux disease 07/04/2022    Atypical chest pain 07/04/2022   Regurgitation of food 07/04/2022   Hiatal hernia 07/04/2022   Chronic constipation 07/04/2022   Colon cancer screening 07/04/2022   Calculus of gallbladder without cholecystitis without obstruction 07/04/2022   Dysfunctional gallbladder 07/04/2022   LLQ pain 07/11/2021   Hot flashes 07/11/2021   Papanicolaou smear of cervix with positive high risk human papilloma virus (HPV) test 06/24/2021   Trichomoniasis 06/24/2021   Menorrhagia with irregular cycle 06/19/2021   Fibroids 06/19/2021   Perimenopause 06/19/2021   Routine Papanicolaou smear 06/19/2021   Mass of left hip region 02/28/2021   Primary osteoarthritis of knees, bilateral 04/08/2018   Radicular pain of left lower extremity 04/08/2018   Past Medical History:  Diagnosis Date   GERD (gastroesophageal reflux disease)    Hernia cerebri (HCC)    Papanicolaou smear of cervix with positive high risk human papilloma virus (HPV) test 06/24/2021   06/24/21 repeat pap in 1 year per ASCCP guidelines    Trichimoniasis 06/24/2021   Treated 06/24/21, POC___________   Past Surgical History:  Procedure Laterality Date   BREAST BIOPSY Left 06/2022   Fibroadenoma   HERNIA REPAIR     tubiligation     Allergies  Allergen Reactions   Bee Venom Shortness Of Breath   Penicillins Hives   Hydrocodone Hives   Levaquin  [Levofloxacin ] Nausea And Vomiting and Rash   Prior to Admission medications   Medication Sig Start Date End Date Taking? Authorizing Provider  acetaminophen  (TYLENOL ) 325 MG tablet Take 650 mg by mouth as needed. pain   Yes [provider]  albuterol  (VENTOLIN  HFA) 108 (90 Base) MCG/ACT inhaler Inhale 2 puffs into the lungs every 6 (six) hours as needed for wheezing or shortness of breath. 04/12/24  Yes Alvia Corean CROME, FNP  aspirin  81 MG EC tablet Take 81 mg by mouth daily. Swallow whole.   Yes [provider]  FEROSUL 325 (65 Fe) MG tablet Take 325 mg by mouth daily.  12/02/20  Yes [provider]  furosemide  (LASIX ) 40 MG tablet Take 1 tablet (40 mg total) by mouth daily as needed for fluid or edema. 06/26/23  Yes Levora Reyes SAUNDERS, MD  levocetirizine (XYZAL ) 5 MG tablet Take 1 tablet (5 mg total) by mouth every evening. 04/12/24  Yes Alvia Corean CROME, FNP  meloxicam  (MOBIC ) 7.5 MG tablet Take 1 tablet (7.5 mg total) by mouth daily. Patient taking differently: Take 7.5 mg by mouth as needed. 07/23/22  Yes Levora Reyes SAUNDERS, MD  naproxen sodium (ALEVE) 220 MG tablet Take 220 mg by mouth as needed.   Yes [provider]  RABEprazole  (ACIPHEX ) 20 MG tablet TAKE 1 TABLET BY MOUTH TWICE DAILY 04/07/24  Yes Mansouraty, Gabriel Jr., MD  sucralfate  (CARAFATE ) 1 g tablet Take 1 tablet (1 g total) by mouth 4 (four) times daily -  with meals and at bedtime. 06/15/24 07/15/24 Yes Bernis Ernst, PA-C  Throat Lozenges (RA VITAMIN C COUGH DROPS MT) Use as directed 2 lozenges in the mouth or throat daily.   Yes [provider]  benzonatate  (  TESSALON ) 100 MG capsule Take 1 capsule (100 mg total) by mouth 3 (three) times daily as needed for cough. Patient not taking: Reported on 06/23/2024 04/12/24   Alvia Corean CROME, FNP   Social History   Socioeconomic History   Marital status: Divorced    Spouse name: Not on file   Number of children: Not on file   Years of education: Not on file   Highest education level: Bachelor's degree (e.g., BA, AB, BS)  Occupational History   Not on file  Tobacco Use   Smoking status: Never    Passive exposure: Never   Smokeless tobacco: Never  Vaping Use   Vaping status: Never Used  Substance and Sexual Activity   Alcohol use: No   Drug use: No   Sexual activity: Yes    Birth control/protection: Surgical    Comment: tubal  Other Topics Concern   Not on file  Social History Narrative   Not on file   Social Drivers of Health   Financial Resource Strain: Low Risk  (06/24/2023)   Overall Financial  Resource Strain (CARDIA)    Difficulty of Paying Living Expenses: Not hard at all  Food Insecurity: Unknown (06/24/2023)   Hunger Vital Sign    Worried About Running Out of Food in the Last Year: Not on file    Ran Out of Food in the Last Year: Never true  Transportation Needs: No Transportation Needs (06/24/2023)   PRAPARE - Administrator, Civil Service (Medical): No    Lack of Transportation (Non-Medical): No  Physical Activity: Unknown (06/24/2023)   Exercise Vital Sign    Days of Exercise per Week: 0 days    Minutes of Exercise per Session: Not on file  Stress: No Stress Concern Present (06/24/2023)   [employer] of Occupational Health - Occupational Stress Questionnaire    Feeling of Stress : Not at all  Social Connections: Moderately Isolated (06/24/2023)   Social Connection and Isolation Panel    Frequency of Communication with Friends and Family: More than three times a week    Frequency of Social Gatherings with Friends and Family: Twice a week    Attends Religious Services: 1 to 4 times per year    Active Member of [employer] or Organizations: No    Attends Engineer, structural: Not on file    Marital Status: Divorced  Intimate Partner Violence: Unknown (04/03/2022)   Received from [facility]   HITS    Physically Hurt: Not on file    Insult or Talk Down To: Not on file    Threaten Physical Harm: Not on file    Scream or Curse: Not on file    Review of Systems Per HPI.   Objective:   Vitals:   06/23/24 0850  BP: (!) 124/90  Pulse: 74  Resp: 17  Temp: 98.2 F (36.8 C)  TempSrc: Temporal  SpO2: 99%  Weight: (!) 302 lb 6.4 oz (137.2 kg)  Height: 5' 9 (1.753 m)     Physical Exam Vitals reviewed.  Constitutional:      Appearance: Normal appearance. She is well-developed. She is obese.  HENT:     Head: Normocephalic and atraumatic.   Eyes:     Conjunctiva/sclera: Conjunctivae normal.     Pupils: Pupils are equal, round, and reactive  to light.   Neck:     Vascular: No carotid bruit.   Cardiovascular:     Rate and Rhythm: Normal rate and regular rhythm.  Heart sounds: Normal heart sounds.  Pulmonary:     Effort: Pulmonary effort is normal.     Breath sounds: Normal breath sounds.  Abdominal:     Palpations: Abdomen is soft. There is no pulsatile mass.     Tenderness: There is abdominal tenderness (Slight discomfort over epigastric abdomen, no rebound or guarding.).   Musculoskeletal:     Right lower leg: No edema.     Left lower leg: No edema.   Skin:    General: Skin is warm and dry.   Neurological:     Mental Status: She is alert and oriented to person, place, and time.   Psychiatric:        Mood and Affect: Mood normal.        Behavior: Behavior normal.     47 minutes spent during visit, including chart review, ER note review, discussion of abnormal labs including hypokalemia and anemia, counseling and assimilation of information, exam, discussion of plan, and chart completion, and coordination of care.    Assessment & Plan:  Mariel Colegrove is a 48 y.o. female . Hiatal hernia - Plan: CBC  Iron deficiency - Plan: CBC  Gastric ulcer, unspecified chronicity, unspecified whether gastric ulcer hemorrhage or perforation present - Plan: CBC  Hypokalemia - Plan: Basic metabolic panel with GFR  Cough, unspecified type  Postnasal drip   Repeat CBC still with anemia but slightly improved from most recent testing.  Prior hypokalemia resolved on repeat BMP.  Potentially could have a component of cough from postnasal drip, started on Flonase, with albuterol  as needed for wheeze, RTC/ER precautions if increased use or need.  Suspect her chest symptoms are still related to the hiatal hernia.,  I did check a chest x-ray to evaluate for secondary contributor.   There have been ongoing discussions with her specialists, gastroenterology and plan for further evaluation with general surgery on treatment options.   Discussions regarding isolated repair of hiatal hernia versus hiatal hernia repair along with bariatric surgery given possible chance of recurrence.  We briefly discussed this in the office but ultimately those discussions are best with surgery.  She is on PPI as well as Carafate  from the ER.  Gastroenterology aware of labs and plan and appreciate their assistance.  In regards to her iron deficiency anemia, plan for urgent referral to hematology for iron infusions.  Plan for colonoscopy for workup of iron deficiency anemia even though she did have a negative Cologuard in 2023.  Plan for barium swallow to further evaluate the hiatal hernia in motion.  Plan for repeat CBC in the next few weeks.  Again I appreciate gastroenterology assistance.  No orders of the defined types were placed in this encounter.  Patient Instructions  Keep follow up with general surgery as planned next week to discuss the hiatal hernia and options.  Try flonase nasal spray to see if that will help postnasal drip and some of the upper airway congestion, but I will also order a chest x-ray to be performed about regular medication today to make sure that we do not see any congestion or pneumonia.  This would be unlikely.  I am also concerned about the lower hemoglobin at your ER visit.  With the history of a gastric ulcer and some upper abdominal pain, that could be a possible contributor.  I will recheck the blood counts today along with your potassium level and can discuss plan later today including possible discussion with your gastroenterologist.  Continue the Aciphex  and  Carafate  for now. If any new or worsening symptoms proceed to the emergency room.  Depending on the labs today we will discuss a follow-up plan.  Hang in there!  [facility] or xray: Walk in 8:30-4:30 during weekdays, no appointment needed [address]  [place], [place] [id number]       Signed,   Reyes Pines, MD [facility], [facility] 06/23/24 9:41 AM

## 2024-06-24 ENCOUNTER — Ambulatory Visit: Payer: Self-pay | Admitting: Family Medicine

## 2024-06-24 ENCOUNTER — Encounter: Payer: Self-pay | Admitting: Family Medicine

## 2024-06-24 ENCOUNTER — Other Ambulatory Visit: Payer: Self-pay

## 2024-06-24 DIAGNOSIS — R051 Acute cough: Secondary | ICD-10-CM

## 2024-06-24 DIAGNOSIS — R062 Wheezing: Secondary | ICD-10-CM

## 2024-06-24 MED ORDER — ALBUTEROL SULFATE HFA 108 (90 BASE) MCG/ACT IN AERS: 2.0000 | INHALATION_SPRAY | Freq: Four times a day (QID) | RESPIRATORY_TRACT | 1 refills | Status: AC | PRN

## 2024-06-24 MED ORDER — FLUTICASONE PROPIONATE 50 MCG/ACT NA SUSP: 1.0000 | Freq: Every day | NASAL | 6 refills | Status: AC

## 2024-06-24 MED ORDER — SUCRALFATE 1 GM/10ML PO SUSP: 1.0000 g | Freq: Two times a day (BID) | ORAL | 1 refills | Status: DC

## 2024-06-24 MED ORDER — SUCRALFATE 1 G PO TABS: 1.0000 g | ORAL_TABLET | Freq: Two times a day (BID) | ORAL | 0 refills | Status: AC

## 2024-06-24 MED ORDER — SUCRALFATE 1 G PO TABS: 1.0000 g | ORAL_TABLET | Freq: Three times a day (TID) | ORAL | 0 refills | Status: DC

## 2024-06-24 NOTE — Telephone Encounter (Signed)
 Patient is asking for a Rx for Flonase okay to send this? Also asking if she should be taking a potassium supplement with the consistent fluctuation. Also once you have the chance patient is curious about lab results

## 2024-06-24 NOTE — Addendum Note (Signed)
 Addended by: ANITRA ODETTA CROME on: 06/24/2024 04:40 PM   Modules accepted: Orders

## 2024-06-24 NOTE — Telephone Encounter (Signed)
 Order for labs, barium swallow and 3-4 week CBC has been entered  Barium swallow order sent to the schedulers  Carafate  has been sent to the pharmacy Pt advised to increase iron to BID Urgent referral made to hematology All information sent to the pt My Chart- she does view her messages also voice mail left  Will await response in regards to surgical referral location as well as if she wishes to proceed with colon.

## 2024-06-25 ENCOUNTER — Encounter: Payer: Self-pay | Admitting: Family Medicine

## 2024-06-27 ENCOUNTER — Ambulatory Visit (INDEPENDENT_AMBULATORY_CARE_PROVIDER_SITE_OTHER)
Admission: RE | Admit: 2024-06-27 | Discharge: 2024-06-27 | Disposition: A | Discharge status: Home / Self Care | Source: Ambulatory Visit | Attending: Family Medicine | Admitting: Family Medicine

## 2024-06-27 DIAGNOSIS — K449 Diaphragmatic hernia without obstruction or gangrene: Secondary | ICD-10-CM

## 2024-06-27 DIAGNOSIS — R059 Cough, unspecified: Secondary | ICD-10-CM | POA: Diagnosis not present

## 2024-06-29 DIAGNOSIS — D508 Other iron deficiency anemias: Secondary | ICD-10-CM | POA: Diagnosis not present

## 2024-06-29 DIAGNOSIS — K449 Diaphragmatic hernia without obstruction or gangrene: Secondary | ICD-10-CM | POA: Diagnosis not present

## 2024-06-29 DIAGNOSIS — K219 Gastro-esophageal reflux disease without esophagitis: Secondary | ICD-10-CM | POA: Diagnosis not present

## 2024-06-30 ENCOUNTER — Telehealth: Payer: Self-pay

## 2024-06-30 ENCOUNTER — Inpatient Hospital Stay: Attending: Hematology | Admitting: Hematology

## 2024-06-30 ENCOUNTER — Inpatient Hospital Stay

## 2024-06-30 ENCOUNTER — Encounter: Payer: Self-pay | Admitting: Hematology

## 2024-06-30 VITALS — BP 124/87 | HR 70 | Temp 97.1°F | Resp 18 | Ht 69.0 in | Wt 303.1 lb

## 2024-06-30 DIAGNOSIS — D509 Iron deficiency anemia, unspecified: Secondary | ICD-10-CM | POA: Insufficient documentation

## 2024-06-30 DIAGNOSIS — Z79899 Other long term (current) drug therapy: Secondary | ICD-10-CM | POA: Insufficient documentation

## 2024-06-30 DIAGNOSIS — D500 Iron deficiency anemia secondary to blood loss (chronic): Secondary | ICD-10-CM | POA: Insufficient documentation

## 2024-06-30 DIAGNOSIS — Z7982 Long term (current) use of aspirin: Secondary | ICD-10-CM | POA: Diagnosis not present

## 2024-06-30 DIAGNOSIS — D649 Anemia, unspecified: Secondary | ICD-10-CM

## 2024-06-30 NOTE — Telephone Encounter (Signed)
 Patient was seen by Dr. Lanny today but left the [facility] without having her labs drawn. Attempted to contact the patient via telephone call.  Unable to reach the patient. LVM for patient to contact the facility.

## 2024-06-30 NOTE — Progress Notes (Addendum)
 [facility]   [phone] [phone]) [phone]   Clinic New Consult Note   Patient Care Team: Levora Reyes SAUNDERS, MD as PCP - General (Family Medicine) Delford Maude BROCKS, MD as PCP - Cardiology (Cardiology) Levora Reyes SAUNDERS, MD as Consulting Physician (Family Medicine) 06/30/2024  CHIEF COMPLAINTS/PURPOSE OF CONSULTATION:  Iron deficient anemia  REFERRING PHYSICIAN: GI Dr. Wilhelmenia   Discussed the use of AI scribe software for clinical note transcription with the patient, who gave verbal consent to proceed.  History of Present Illness Terri Wiley is a 48 year old female with chronic iron deficiency anemia who presents for a new consult. She was referred by Dr. Tanda for evaluation of her iron deficiency anemia.  She has chronic iron deficiency anemia since childhood, with normal hemoglobin levels only during pregnancies. Recently, her hemoglobin dropped to 8.3 g/dL last week and 7.7 g/dL two weeks ago. She takes oral ferrous sulfate but has not received IV iron or blood transfusions.  She has a 7 cm hiatal hernia, esophagitis, a gastric ulcer, and erosive gastritis, diagnosed via endoscopy in April. She experiences reflux and heartburn, sometimes with vomiting, triggered by certain foods. She takes Aleve two to three times a week for arthritis and a daily baby aspirin .  She has not had a menstrual period in over two and a half years and denies heavy menstrual bleeding, noticeable bleeding, blood in stool, or melena. Her family history includes unspecified cancers on both sides. Her middle son died of heart failure due to an enlarged heart in September 2022.     MEDICAL HISTORY:  Past Medical History:  Diagnosis Date   GERD (gastroesophageal reflux disease)    Hernia cerebri (HCC)    Papanicolaou smear of cervix with positive high risk human papilloma virus (HPV) test 06/24/2021   06/24/21 repeat pap in 1 year per ASCCP guidelines    Trichimoniasis  06/24/2021   Treated 06/24/21, POC___________    SURGICAL HISTORY: Past Surgical History:  Procedure Laterality Date   BREAST BIOPSY Left 06/2022   Fibroadenoma   HERNIA REPAIR     tubiligation      SOCIAL HISTORY: Social History   Socioeconomic History   Marital status: Divorced    Spouse name: Not on file   Number of children: 5   Years of education: Not on file   Highest education level: Bachelor's degree (e.g., BA, AB, BS)  Occupational History   Not on file  Tobacco Use   Smoking status: Never    Passive exposure: Never   Smokeless tobacco: Never  Vaping Use   Vaping status: Never Used  Substance and Sexual Activity   Alcohol use: No   Drug use: No   Sexual activity: Yes    Birth control/protection: Surgical    Comment: tubal  Other Topics Concern   Not on file  Social History Narrative   Not on file   Social Drivers of Health   Financial Resource Strain: Low Risk  (06/24/2023)   Overall Financial Resource Strain (CARDIA)    Difficulty of Paying Living Expenses: Not hard at all  Food Insecurity: No Food Insecurity (06/30/2024)   Hunger Vital Sign    Worried About Running Out of Food in the Last Year: Never true    Ran Out of Food in the Last Year: Never true  Transportation Needs: No Transportation Needs (06/30/2024)   PRAPARE - Administrator, Civil Service (Medical): No    Lack of Transportation (Non-Medical): No  Physical Activity: Unknown (06/24/2023)   Exercise Vital Sign    Days of Exercise per Week: 0 days    Minutes of Exercise per Session: Not on file  Stress: No Stress Concern Present (06/24/2023)   [employer] of Occupational Health - Occupational Stress Questionnaire    Feeling of Stress : Not at all  Social Connections: Moderately Isolated (06/24/2023)   Social Connection and Isolation Panel    Frequency of Communication with Friends and Family: More than three times a week    Frequency of Social Gatherings with Friends and  Family: Twice a week    Attends Religious Services: 1 to 4 times per year    Active Member of [employer] or Organizations: No    Attends Engineer, structural: Not on file    Marital Status: Divorced  Intimate Partner Violence: Not At Risk (06/30/2024)   Humiliation, Afraid, Rape, and Kick questionnaire    Fear of Current or Ex-Partner: No    Emotionally Abused: No    Physically Abused: No    Sexually Abused: No    FAMILY HISTORY: Family History  Problem Relation Age of Onset   Liver disease Mother    Hypertension Mother    Diabetes Mother    Aneurysm Father    Hypertension Father    Cancer Maternal Grandfather        KNOWN CANCER   Cancer Paternal Grandmother        unknown type   Cancer Paternal Grandfather        unknown type   Heart failure Son    Colon cancer Neg Hx    Stomach cancer Neg Hx    Esophageal cancer Neg Hx    Inflammatory bowel disease Neg Hx    Pancreatic cancer Neg Hx    Rectal cancer Neg Hx     ALLERGIES:  is allergic to bee venom, penicillins, hydrocodone, and levaquin  [levofloxacin ].  MEDICATIONS:  Current Outpatient Medications  Medication Sig Dispense Refill   acetaminophen  (TYLENOL ) 325 MG tablet Take 650 mg by mouth as needed. pain     albuterol  (VENTOLIN  HFA) 108 (90 Base) MCG/ACT inhaler Inhale 2 puffs into the lungs every 6 (six) hours as needed for wheezing or shortness of breath. 18 g 1   aspirin  81 MG EC tablet Take 81 mg by mouth daily. Swallow whole.     benzonatate  (TESSALON ) 100 MG capsule Take 1 capsule (100 mg total) by mouth 3 (three) times daily as needed for cough. 20 capsule 0   FEROSUL 325 (65 Fe) MG tablet Take 325 mg by mouth daily.     fluticasone  (FLONASE ) 50 MCG/ACT nasal spray Place 1-2 sprays into both nostrils daily. 16 g 6   furosemide  (LASIX ) 40 MG tablet Take 1 tablet (40 mg total) by mouth daily as needed for fluid or edema. 30 tablet 1   levocetirizine (XYZAL ) 5 MG tablet Take 1 tablet (5 mg total) by mouth every  evening. 90 tablet 1   meloxicam  (MOBIC ) 7.5 MG tablet Take 1 tablet (7.5 mg total) by mouth daily. (Patient taking differently: Take 7.5 mg by mouth as needed.) 30 tablet 0   naproxen sodium (ALEVE) 220 MG tablet Take 220 mg by mouth as needed.     RABEprazole  (ACIPHEX ) 20 MG tablet TAKE 1 TABLET BY MOUTH TWICE DAILY 60 tablet 6   sucralfate  (CARAFATE ) 1 g tablet Take 1 tablet (1 g total) by mouth 2 (two) times daily. Crush and mix with 10 ml water  to make slurry 60 tablet 0   Throat Lozenges (RA VITAMIN C COUGH DROPS MT) Use as directed 2 lozenges in the mouth or throat daily.     No current facility-administered medications for this visit.    REVIEW OF SYSTEMS:   Constitutional: Denies fevers, chills or abnormal night sweats Eyes: Denies blurriness of vision, double vision or watery eyes Ears, nose, mouth, throat, and face: Denies mucositis or sore throat Respiratory: Denies cough, dyspnea or wheezes Cardiovascular: Denies palpitation, chest discomfort or lower extremity swelling Gastrointestinal:  Denies nausea, heartburn or change in bowel habits Skin: Denies abnormal skin rashes Lymphatics: Denies new lymphadenopathy or easy bruising Neurological:Denies numbness, tingling or new weaknesses Behavioral/Psych: Mood is stable, no new changes  All other systems were reviewed with the patient and are negative.  PHYSICAL EXAMINATION: ECOG PERFORMANCE STATUS: 0 - Asymptomatic  Vitals:   06/30/24 1356 06/30/24 1400  BP: (!) 171/99 124/87  Pulse: 85 70  Resp: 18   Temp: (!) 97.1 F (36.2 C)   SpO2: 99% 100%   Filed Weights   06/30/24 1356  Weight: (!) 303 lb 1.6 oz (137.5 kg)    GENERAL:alert, no distress and comfortable SKIN: skin color, texture, turgor are normal, no rashes or significant lesions EYES: normal, conjunctiva are pink and non-injected, sclera clear OROPHARYNX:no exudate, no erythema and lips, buccal mucosa, and tongue normal  NECK: supple, thyroid  normal size,  non-tender, without nodularity LYMPH:  no palpable lymphadenopathy in the cervical, axillary or inguinal LUNGS: clear to auscultation and percussion with normal breathing effort HEART: regular rate & rhythm and no murmurs and no lower extremity edema ABDOMEN:abdomen soft, non-tender and normal bowel sounds, no organomegaly  Musculoskeletal:no cyanosis of digits and no clubbing  PSYCH: alert & oriented x 3 with fluent speech NEURO: no focal motor/sensory deficits  Physical Exam   LABORATORY DATA:  I have reviewed the data as listed    Latest Ref Rng & Units 06/23/2024    9:46 AM 06/14/2024    6:06 PM 03/18/2024    4:31 PM  CBC  WBC 4.0 - 10.5 K/uL 5.9  5.9  6.6   Hemoglobin 12.0 - 15.0 g/dL 8.3 Repeated and verified X2.  7.7  9.7   Hematocrit 36.0 - 46.0 % 27.1  26.6  31.1   Platelets 150.0 - 400.0 K/uL 321.0  278  330.0       Latest Ref Rng & Units 06/23/2024    9:46 AM 06/14/2024    6:06 PM 03/18/2024    4:31 PM  CMP  Glucose 70 - 99 mg/dL 93  892  92   BUN 6 - 23 mg/dL 13  8  14    Creatinine 0.40 - 1.20 mg/dL 9.28  9.27  9.29   Sodium 135 - 145 mEq/L 141  141  143   Potassium 3.5 - 5.1 mEq/L 3.9  3.1  3.9   Chloride 96 - 112 mEq/L 107  108  108   CO2 19 - 32 mEq/L 27  24  29    Calcium 8.4 - 10.5 mg/dL 9.1  8.6  9.4   Total Protein 6.5 - 8.1 g/dL  7.3    Total Bilirubin 0.0 - 1.2 mg/dL  0.5    Alkaline Phos 38 - 126 U/L  92    AST 15 - 41 U/L  13    ALT 0 - 44 U/L  14        RADIOGRAPHIC STUDIES: I have personally reviewed the radiological images  as listed and agreed with the findings in the report. DG Chest 2 View Result Date: 06/27/2024 CLINICAL DATA:  Cough, hiatal hernia EXAM: CHEST - 2 VIEW COMPARISON:  Chest x-ray performed June 14, 2024 FINDINGS: Heart mediastinum are not significantly changed. Similar appearance of hiatal hernia. No effusion or pneumothorax. IMPRESSION: 1. Similar appearance of hiatal hernia. Electronically Signed   By: Maude Naegeli M.D.   On:  06/27/2024 11:11   CT Angio Chest/Abd/Pel for Dissection W and/or Wo Contrast Result Date: 06/14/2024 CLINICAL DATA:  Emesis, difficulty breathing, hiatal hernia EXAM: CT ANGIOGRAPHY CHEST, ABDOMEN AND PELVIS TECHNIQUE: Non-contrast CT of the chest was initially obtained. Multidetector CT imaging through the chest, abdomen and pelvis was performed using the standard protocol during bolus administration of intravenous contrast. Multiplanar reconstructed images and MIPs were obtained and reviewed to evaluate the vascular anatomy. RADIATION DOSE REDUCTION: This exam was performed according to the departmental dose-optimization program which includes automated exposure control, adjustment of the mA and/or kV according to patient size and/or use of iterative reconstruction technique. CONTRAST:  OMNIPAQUE  IOHEXOL  350 MG/ML SOLN COMPARISON:  06/14/2024, 07/13/2023 FINDINGS: CTA CHEST FINDINGS Cardiovascular: No evidence of thoracic aortic aneurysm or dissection. The heart is unremarkable without pericardial effusion. Mediastinum/Nodes: No enlarged mediastinal, hilar, or axillary lymph nodes. Thyroid  gland, trachea, and esophagus demonstrate no significant findings. There is a large hiatal hernia, with the majority of the stomach in an intrathoracic location, unchanged since prior exams. Lungs/Pleura: Left lower lobe compressive atelectasis. No airspace disease, effusion, or pneumothorax. Central airways are patent. Musculoskeletal: No acute or destructive bony abnormalities. Reconstructed images demonstrate no additional findings. Review of the MIP images confirms the above findings. CTA ABDOMEN AND PELVIS FINDINGS VASCULAR Aorta: Normal caliber aorta without aneurysm, dissection, vasculitis or significant stenosis. Celiac: Patent without evidence of aneurysm, dissection, vasculitis or significant stenosis. SMA: Patent without evidence of aneurysm, dissection, vasculitis or significant stenosis. Renals: Both renal  arteries are patent without evidence of aneurysm, dissection, vasculitis, fibromuscular dysplasia or significant stenosis. IMA: Patent without evidence of aneurysm, dissection, vasculitis or significant stenosis. Inflow: Patent without evidence of aneurysm, dissection, vasculitis or significant stenosis. Veins: No obvious venous abnormality within the limitations of this arterial phase study. Review of the MIP images confirms the above findings. NON-VASCULAR Hepatobiliary: No focal liver abnormality is seen. No gallstones, gallbladder wall thickening, or biliary dilatation. Pancreas: Unremarkable. No pancreatic ductal dilatation or surrounding inflammatory changes. Spleen: Normal in size without focal abnormality. Adrenals/Urinary Tract: 5 mm nonobstructing left renal calculus. No right-sided calculi. The kidneys enhance normally. The adrenals and bladder are unremarkable. Stomach/Bowel: No bowel obstruction or ileus. Normal appendix right lower quadrant. No bowel wall thickening or inflammatory change. Lymphatic: No pathologic adenopathy. Reproductive: Heterogeneous enlarged uterus consistent with multiple fibroids. No adnexal masses. Other: No free fluid or free intraperitoneal gas. No abdominal wall hernia. Musculoskeletal: No acute or destructive bony abnormalities. Reconstructed images demonstrate no additional findings. Review of the MIP images confirms the above findings. IMPRESSION: Vascular: 1. No evidence of thoracoabdominal aortic aneurysm or dissection. Nonvascular: 1. Large hiatal hernia, with compressive atelectasis of the left lower lobe. 2. Nonobstructing 5 mm left renal calculus. 3. Fibroid uterus. Electronically Signed   By: Ozell Daring M.D.   On: 06/14/2024 22:08   DG Chest 2 View Result Date: 06/14/2024 CLINICAL DATA:  chest pain, SOB EXAM: CHEST - 2 VIEW COMPARISON:  None available. FINDINGS: Streaky atelectasis in the left lung base. No focal airspace consolidation, pleural effusion, or  pneumothorax.  No cardiomegaly. Large hiatal hernia. No acute fracture or destructive lesion. Dextrocurvature of the thoracolumbar spine. Multilevel degenerative disc disease of the spine. IMPRESSION: No acute cardiopulmonary abnormality. Electronically Signed   By: Rogelia Myers M.D.   On: 06/14/2024 18:08    Assessment & Plan Iron deficiency anemia Chronic iron deficiency anemia, worsening over the past year with hemoglobin levels dropping to 7.7 and 8.3, likely due to gastrointestinal bleeding from hide hernia,  gastric ulcer or erosive gastritis which showed an EGD in April 2025. Oral iron supplementation has been insufficient. - Repeat iron studies to assess current levels - Administer 1 gram IV iron (Venofer) in divided doses over five treatments - Monitor blood counts and iron levels every two weeks - Hold blood transfusion for now, reassess based on response to IV iron - Schedule follow-up in 2-3 months  Gastric ulcer and erosive gastritis Gastric ulcer and erosive gastritis identified on endoscopy, not actively bleeding, likely contributing to iron deficiency anemia. NSAID use (Aleve) and daily aspirin  may have exacerbated gastric issues. - Avoid NSAIDs and continue baby aspirin  as needed - Complete scheduled colonoscopy and capsule endoscopy to rule out other sources of GI bleeding  Hiatal hernia with esophagitis Hiatal hernia diagnosed with endoscopy, associated with esophagitis and heartburn. No evidence of bleeding or malignancy. Symptoms include acid reflux and occasional vomiting.  Knee pain due to arthritis Chronic knee pain due to arthritis, previously managed with Aleve, which was discontinued post-endoscopy due to potential exacerbation of gastric issues.  General Health Maintenance Routine health maintenance is up to date with mammogram and Pap smear. Regular monitoring of cholesterol and blood sugar with primary care physician.  Plan - She has inadequate response to  oral iron, I recommend IV iron Venofer 1 g in the next 2 to 3 weeks -Repeat iron studies today - Coordinate with [facility] office for IV iron administration - Ensure lab appointments every two weeks for blood count and iron level monitoring - Return for follow-up visit in 2-3 months - She will follow-up with Dr. Wilhelmenia for repeated EGD, and probable colonoscopy for iron deficient anemia     Orders Placed This Encounter  Procedures   CBC with Differential/Platelet    Standing Status:   Standing    Number of Occurrences:   50    Expiration Date:   06/30/2025   Ferritin    Standing Status:   Standing    Number of Occurrences:   50    Expiration Date:   06/30/2025   Vitamin B12    Standing Status:   Future    Number of Occurrences:   1    Expected Date:   06/30/2024    Expiration Date:   06/30/2025   Iron and TIBC    Standing Status:   Standing    Number of Occurrences:   30    Expiration Date:   06/30/2025    All questions were answered. The patient knows to call the clinic with any problems, questions or concerns. I spent 25 minutes counseling the patient face to face. The total time spent in the appointment was 30 minutes including review of chart and various tests results, discussions about plan of care and coordination of care plan.     Onita Mattock, MD 06/30/2024

## 2024-07-04 ENCOUNTER — Telehealth: Payer: Self-pay

## 2024-07-04 NOTE — Telephone Encounter (Signed)
 Dr. Lanny, patient will be scheduled as soon as possible.  Auth Submission: NO AUTH NEEDED Site of care: Site of care: [facility] Payer: BCBS and [employer] commercial Medication & CPT/J Code(s) submitted: Venofer (Iron Sucrose) J1756 Diagnosis Code:  Route of submission (phone, fax, portal):  Phone # Fax # Auth type: Buy/Bill PB Units/visits requested: 200mg  x 5 doses Reference number:  Approval from: 07/04/24 to 10/04/24

## 2024-07-05 ENCOUNTER — Telehealth: Payer: Self-pay

## 2024-07-05 NOTE — Telephone Encounter (Signed)
 ----- Message from Terri Wiley sent at 07/05/2024 12:15 PM EDT ----- Regarding: RE: Severe obesity/hiatal hernia/anemia Terri Wiley, Just want a follow-up if we have been able to get the patient scheduled for colonoscopy and repeat upper endoscopy? Thanks. GM ----- Message ----- From: Terri Krabbe, FNP Sent: 06/29/2024   2:57 PM EDT To: Terri Blush, MD; Terri LITTIE Curly, RN; Terri Wiley# Subject: RE: Severe obesity/hiatal hernia/anemia        Dr. Blush,  Thank you.  We will reach out to her and schedule her for a follow up appointment.    Thank you, Wiley ----- Message ----- From: Terri Callander, MD Sent: 06/29/2024   1:47 PM EDT To: Terri Blush, MD; Terri LITTIE Curly, RN; Terri Wiley# Subject: RE: Severe obesity/hiatal hernia/anemia        Dr. Blush,  Thanks for the detailed info!  I will offer her iv iron if she agrees, hopefully that helps her.  Thanks   Wiley ----- Message ----- From: Wiley Camellia, MD Sent: 06/29/2024  12:05 PM EDT To: Terri LITTIE Curly, RN; Terri JONELLE Pines, MD; Terri Wiley# Subject: Severe obesity/hiatal hernia/anemia            Hi (sorry for long message)  I just to keep everybody on the same page regarding this mutual patient.  She is seeing hematology on July 3 to evaluate and manage her anemia  I saw her this morning.  My goal with this appointment was just to see what her understanding of what everything is considering there is a lot going on.  First she was unaware that GI had recommended a colonoscopy just to make sure there is nothing going on in the colon as the source of her anemia.  She is agreeable to a colonoscopy.  Not sure if you want to do her recall upper endoscopy at the same time you do her colonoscopy.  Terri Wiley-thank you for ordering the esophagram/upper GI.  It does not look like radiology has called her to schedule that  We discussed that her current BMI which is around 10 does make a pure hiatal hernia repair more technically challenging given  the fact that most of her stomach is in her chest as well as it significantly increases her risk of hiatal hernia recurrence.  She is symptomatic from her large hiatal hernia but I think we all know that recurrent foregut surgery is more technically challenging and higher risk so ideally would like to optimize weight if we can prior to surgery to help minimize the risk of hiatal hernia recurrence.  Because even in a normal weight individual the risk of hiatal hernia recurrence is 15 to 20%.  We had a long discussion about her weight and the concern about it and undergoing hiatal hernia repair surgery.  We discussed that there are 2 ways to look at her weight medical management and/or bariatric surgery specifically a gastric bypass which would address the hiatal hernia during that surgery  She just seemed a little bit overwhelmed.  She is interested to continuing to work with healthy weight wellness [place] sure if she is a candidate for GLP-1 from an insurance coverage  Terri Wiley-are you able to see her again and continue working with her?  In the interim I think she needs to get the upper GI/esophagram, get the colonoscopy that Dr. Wilhelmenia recommended-probably recall upper endoscopy as well to evaluate the gastric ulcer but defer that to [facility]; and working on improving her anemia  And then I think we can regroup?

## 2024-07-05 NOTE — Telephone Encounter (Signed)
 Left message on machine to call back   Needs EGD COLON

## 2024-07-06 ENCOUNTER — Inpatient Hospital Stay

## 2024-07-06 ENCOUNTER — Encounter: Payer: Self-pay | Admitting: Gastroenterology

## 2024-07-06 DIAGNOSIS — Z79899 Other long term (current) drug therapy: Secondary | ICD-10-CM | POA: Diagnosis not present

## 2024-07-06 DIAGNOSIS — Z7982 Long term (current) use of aspirin: Secondary | ICD-10-CM | POA: Diagnosis not present

## 2024-07-06 DIAGNOSIS — D649 Anemia, unspecified: Secondary | ICD-10-CM

## 2024-07-06 DIAGNOSIS — D509 Iron deficiency anemia, unspecified: Secondary | ICD-10-CM | POA: Diagnosis not present

## 2024-07-06 LAB — CBC WITH DIFFERENTIAL/PLATELET
Abs Immature Granulocytes: 0.01 K/uL (ref 0.00–0.07)
Basophils Absolute: 0 K/uL (ref 0.0–0.1)
Basophils Relative: 1 %
Eosinophils Absolute: 0.1 K/uL (ref 0.0–0.5)
Eosinophils Relative: 1 %
HCT: 28.2 % — ABNORMAL LOW (ref 36.0–46.0)
Hemoglobin: 8.4 g/dL — ABNORMAL LOW (ref 12.0–15.0)
Immature Granulocytes: 0 %
Lymphocytes Relative: 30 %
Lymphs Abs: 1.8 K/uL (ref 0.7–4.0)
MCH: 21.4 pg — ABNORMAL LOW (ref 26.0–34.0)
MCHC: 29.8 g/dL — ABNORMAL LOW (ref 30.0–36.0)
MCV: 71.8 fL — ABNORMAL LOW (ref 80.0–100.0)
Monocytes Absolute: 0.3 K/uL (ref 0.1–1.0)
Monocytes Relative: 5 %
Neutro Abs: 3.8 K/uL (ref 1.7–7.7)
Neutrophils Relative %: 63 %
Platelets: 295 K/uL (ref 150–400)
RBC: 3.93 MIL/uL (ref 3.87–5.11)
RDW: 17.7 % — ABNORMAL HIGH (ref 11.5–15.5)
WBC: 5.9 K/uL (ref 4.0–10.5)
nRBC: 0 % (ref 0.0–0.2)

## 2024-07-06 NOTE — Telephone Encounter (Signed)
 The pt has returned call and is being set up for endo colon in the [facility] with GM.

## 2024-07-06 NOTE — Telephone Encounter (Signed)
 Left message on machine to call back

## 2024-07-07 ENCOUNTER — Other Ambulatory Visit: Payer: Self-pay

## 2024-07-08 ENCOUNTER — Other Ambulatory Visit: Payer: Self-pay

## 2024-07-19 ENCOUNTER — Ambulatory Visit (HOSPITAL_COMMUNITY)
Admission: RE | Admit: 2024-07-19 | Discharge: 2024-07-19 | Disposition: A | Discharge status: Home / Self Care | Source: Ambulatory Visit | Attending: Gastroenterology | Admitting: Gastroenterology

## 2024-07-19 DIAGNOSIS — R1310 Dysphagia, unspecified: Secondary | ICD-10-CM | POA: Diagnosis not present

## 2024-07-19 DIAGNOSIS — D509 Iron deficiency anemia, unspecified: Secondary | ICD-10-CM | POA: Insufficient documentation

## 2024-07-19 DIAGNOSIS — K219 Gastro-esophageal reflux disease without esophagitis: Secondary | ICD-10-CM | POA: Diagnosis not present

## 2024-07-19 DIAGNOSIS — K449 Diaphragmatic hernia without obstruction or gangrene: Secondary | ICD-10-CM | POA: Diagnosis not present

## 2024-07-19 DIAGNOSIS — R110 Nausea: Secondary | ICD-10-CM | POA: Diagnosis not present

## 2024-07-20 ENCOUNTER — Ambulatory Visit

## 2024-07-20 ENCOUNTER — Other Ambulatory Visit: Payer: Self-pay | Admitting: Hematology

## 2024-07-20 VITALS — BP 123/83 | HR 72 | Temp 98.0°F | Resp 18 | Ht 69.5 in | Wt 298.6 lb

## 2024-07-20 DIAGNOSIS — D509 Iron deficiency anemia, unspecified: Secondary | ICD-10-CM | POA: Diagnosis not present

## 2024-07-20 DIAGNOSIS — T454X5A Adverse effect of iron and its compounds, initial encounter: Secondary | ICD-10-CM | POA: Diagnosis not present

## 2024-07-20 DIAGNOSIS — D500 Iron deficiency anemia secondary to blood loss (chronic): Secondary | ICD-10-CM

## 2024-07-20 MED ORDER — ACETAMINOPHEN 325 MG PO TABS
650.0000 mg | ORAL_TABLET | Freq: Once | ORAL | Status: AC
Start: 2024-07-20 — End: 2024-07-20
  Administered 2024-07-20: 650 mg via ORAL
  Filled 2024-07-20: qty 2

## 2024-07-20 MED ORDER — DIPHENHYDRAMINE HCL 25 MG PO CAPS
25.0000 mg | ORAL_CAPSULE | Freq: Once | ORAL | Status: AC
  Administered 2024-07-20: 25 mg via ORAL
  Filled 2024-07-20: qty 1

## 2024-07-20 MED ORDER — METHYLPREDNISOLONE SODIUM SUCC 125 MG IJ SOLR
125.0000 mg | Freq: Once | INTRAMUSCULAR | Status: AC | PRN
  Administered 2024-07-20: 125 mg via INTRAVENOUS

## 2024-07-20 MED ORDER — ALBUTEROL SULFATE HFA 108 (90 BASE) MCG/ACT IN AERS: 2.0000 | INHALATION_SPRAY | Freq: Once | RESPIRATORY_TRACT | Status: DC | PRN

## 2024-07-20 MED ORDER — IRON SUCROSE 20 MG/ML IV SOLN
200.0000 mg | Freq: Once | INTRAVENOUS | Status: AC
  Administered 2024-07-20: 200 mg via INTRAVENOUS
  Filled 2024-07-20: qty 10

## 2024-07-20 MED ORDER — EPINEPHRINE 0.3 MG/0.3ML IJ SOAJ: 0.3000 mg | Freq: Once | INTRAMUSCULAR | Status: DC | PRN

## 2024-07-20 MED ORDER — DIPHENHYDRAMINE HCL 50 MG/ML IJ SOLN
50.0000 mg | Freq: Once | INTRAMUSCULAR | Status: AC | PRN
  Administered 2024-07-20: 50 mg via INTRAVENOUS

## 2024-07-20 MED ORDER — FAMOTIDINE IN NACL 20-0.9 MG/50ML-% IV SOLN
20.0000 mg | Freq: Once | INTRAVENOUS | Status: AC | PRN
  Administered 2024-07-20: 20 mg via INTRAVENOUS

## 2024-07-20 MED ORDER — SODIUM CHLORIDE 0.9 % IV SOLN
Freq: Once | INTRAVENOUS | Status: AC | PRN
Start: 2024-07-20 — End: 2024-07-20

## 2024-07-20 NOTE — Progress Notes (Signed)
 Diagnosis: Iron  Deficiency Anemia  Provider:  Praveen Mannam MD  Procedure: IV Push  IV Type: Peripheral, IV Location: L Antecubital  Venofer  (Iron  Sucrose), Dose: 200 mg  Post Infusion IV Care: Observation period completed and Peripheral IV Discontinued  1102- Towards the end of observation period, pt began to complain of nausea, and began to vomit. Denies any shortness of breath, chest pain, back pain, itching, and no rash noted. Emergency protocol initiated. NS bolus started at 939ml/hr, Pepcid  20mg , and Solumedrol 125mg . VSS. Ordering provider, Dr. Onita Mattock notified via secure chat.  1118- Pt continues to deny any itching or difficulty breathing. Reports nausea getting better. VS remain stable. 1122- Per Dr. Mattock, administered Benadryl  25mg  IVP.   Normal Saline, Dose: 1000 ml  Infusion Start Time: 1102  Infusion Stop Time: 1230   Pepcid  (Famotidine ), Dose: 20 mg  Infusion Start Time: 1104  Infusion Stop Time: 1134   Performed by:  Donny Childes, RN      Discharge: Condition: Good, Destination: Home . AVS Provided  Performed by:  Charlie Seda, RN

## 2024-07-21 ENCOUNTER — Ambulatory Visit: Payer: Self-pay | Admitting: Gastroenterology

## 2024-07-21 ENCOUNTER — Inpatient Hospital Stay (HOSPITAL_BASED_OUTPATIENT_CLINIC_OR_DEPARTMENT_OTHER): Admitting: Hematology

## 2024-07-21 DIAGNOSIS — D500 Iron deficiency anemia secondary to blood loss (chronic): Secondary | ICD-10-CM

## 2024-07-21 NOTE — Assessment & Plan Note (Signed)
-  Chronic iron  deficiency anemia, worsening over the past year with hemoglobin levels dropping to 7.7 and 8.3, likely due to gastrointestinal bleeding from hide hernia,  gastric ulcer or erosive gastritis which showed an EGD in April 2025. Oral iron  supplementation has been insufficient.  -she received first dose Venofer  200mg  on 07/20/2024, developed severe nausea and vomiting  - Will change to IV Feraheme with premedications

## 2024-07-22 ENCOUNTER — Encounter: Payer: Self-pay | Admitting: Cardiovascular Disease

## 2024-07-22 ENCOUNTER — Encounter: Payer: Self-pay | Admitting: Gastroenterology

## 2024-07-22 ENCOUNTER — Encounter: Payer: Self-pay | Admitting: Hematology

## 2024-07-22 NOTE — Progress Notes (Signed)
 Call pt multiple times, no answer. I left a message for her to call back.  Terri Wiley  07/21/2024

## 2024-07-25 ENCOUNTER — Telehealth: Payer: Self-pay

## 2024-07-25 ENCOUNTER — Ambulatory Visit

## 2024-07-25 ENCOUNTER — Other Ambulatory Visit: Payer: Self-pay

## 2024-07-25 DIAGNOSIS — I517 Cardiomegaly: Secondary | ICD-10-CM

## 2024-07-25 NOTE — Telephone Encounter (Signed)
 Spoke with pt via telephone to inform pt that Dr. Lanny would like to cancel all the pt's IV Iron  appts scheduled at [facility].  Pt's 1st IV Iron  infusion is scheduled for today 07/25/2024.  Pt stated that she was already aware of the cancellation because Dr. Lanny contacted the pt over the weekend.  Stated Dr. Demetra scheduler will be contacting the pt to get her scheduled for lab OV w/Dr. Lanny w/in the next 4 to 6 wks.  Pt verbalized understanding and had no further questions or concerns.    Sent staff message to [facility] stating that Dr. Lanny would like to cancel the pt's IV Iron  infusions effective today until further notice.

## 2024-07-25 NOTE — Telephone Encounter (Signed)
 Called patient back about message. Patient verbalized understanding and will place order for echo.

## 2024-07-25 NOTE — Telephone Encounter (Signed)
-----   Message from Maude Emmer sent at 07/25/2024  8:28 AM EDT ----- Needs updated echo for cardiomegaly Had an iv iron  reaction recently

## 2024-07-26 ENCOUNTER — Ambulatory Visit (HOSPITAL_COMMUNITY)
Admission: RE | Admit: 2024-07-26 | Discharge: 2024-07-26 | Disposition: A | Discharge status: Home / Self Care | Source: Ambulatory Visit | Attending: Cardiology | Admitting: Cardiology

## 2024-07-26 ENCOUNTER — Ambulatory Visit: Payer: Self-pay | Admitting: Cardiovascular Disease

## 2024-07-26 DIAGNOSIS — I517 Cardiomegaly: Secondary | ICD-10-CM | POA: Diagnosis not present

## 2024-07-26 LAB — ECHOCARDIOGRAM COMPLETE
Area-P 1/2: 2.73 cm2
S' Lateral: 3.4 cm

## 2024-07-26 NOTE — Telephone Encounter (Signed)
 Brandi, Please let Ms. Colquitt know, we are sorry about this experience that she had with the infusion center. With that being said, we do need to review and make sure nothing else is a potential reason for her anemia and iron  deficiency and to follow-up the previous inflammation of the upper GI tract. That is why we are moving forward with the EGD/colonoscopy in the coming weeks. There has been multiple discussions offline about her history and her symptoms with her other doctors and trying to find out what would be best for her in the short and long-term in regards to potential surgical options that may be required from a hiatal hernia standpoint. If we have earlier availability for her EGD/colonoscopy, she can certainly be put into an earlier spot, but she may need to reach out in a few weeks to see if something has opened up, because I do not see any earlier availability at this time. We will get her back to the surgeons, as soon as we know that she has no other reason for her iron  deficiency. Thanks. GM

## 2024-07-27 ENCOUNTER — Ambulatory Visit

## 2024-07-27 ENCOUNTER — Telehealth: Payer: Self-pay | Admitting: Hematology

## 2024-07-27 NOTE — Telephone Encounter (Signed)
 Scheduled appointments per staff message. Talked with the patient and she is aware of the made appointments.

## 2024-07-29 ENCOUNTER — Ambulatory Visit

## 2024-08-11 ENCOUNTER — Ambulatory Visit

## 2024-08-15 ENCOUNTER — Telehealth: Payer: Self-pay | Admitting: *Deleted

## 2024-08-15 ENCOUNTER — Ambulatory Visit

## 2024-08-15 NOTE — Telephone Encounter (Signed)
 Attempt to reach pt for pre-visit. LM with call back #.  Will attempt to reach again in 5 min due to no other # listed in profile  Second attempt to reach pt for pre-vist unsuccessful. LM with facility # for pt to call back. Instructed pt to call # given by end of the day and reschedule the pre-visit  with RN or the scheduled procedure will be canceled.

## 2024-08-15 NOTE — Progress Notes (Unsigned)
 SABRA

## 2024-08-19 ENCOUNTER — Inpatient Hospital Stay: Attending: Hematology

## 2024-08-19 DIAGNOSIS — D509 Iron deficiency anemia, unspecified: Secondary | ICD-10-CM | POA: Diagnosis not present

## 2024-08-19 DIAGNOSIS — D649 Anemia, unspecified: Secondary | ICD-10-CM

## 2024-08-19 LAB — CBC WITH DIFFERENTIAL/PLATELET
Abs Immature Granulocytes: 0.01 K/uL (ref 0.00–0.07)
Basophils Absolute: 0 K/uL (ref 0.0–0.1)
Basophils Relative: 0 %
Eosinophils Absolute: 0.1 K/uL (ref 0.0–0.5)
Eosinophils Relative: 1 %
HCT: 34.3 % — ABNORMAL LOW (ref 36.0–46.0)
Hemoglobin: 10.6 g/dL — ABNORMAL LOW (ref 12.0–15.0)
Immature Granulocytes: 0 %
Lymphocytes Relative: 31 %
Lymphs Abs: 1.8 K/uL (ref 0.7–4.0)
MCH: 24.3 pg — ABNORMAL LOW (ref 26.0–34.0)
MCHC: 30.9 g/dL (ref 30.0–36.0)
MCV: 78.7 fL — ABNORMAL LOW (ref 80.0–100.0)
Monocytes Absolute: 0.4 K/uL (ref 0.1–1.0)
Monocytes Relative: 7 %
Neutro Abs: 3.5 K/uL (ref 1.7–7.7)
Neutrophils Relative %: 61 %
Platelets: 270 K/uL (ref 150–400)
RBC: 4.36 MIL/uL (ref 3.87–5.11)
RDW: 23.7 % — ABNORMAL HIGH (ref 11.5–15.5)
WBC: 5.8 K/uL (ref 4.0–10.5)
nRBC: 0 % (ref 0.0–0.2)

## 2024-08-19 LAB — IRON AND IRON BINDING CAPACITY (CC-WL,HP ONLY)
Iron: 94 ug/dL (ref 28–170)
Saturation Ratios: 32 % — ABNORMAL HIGH (ref 10.4–31.8)
TIBC: 291 ug/dL (ref 250–450)
UIBC: 197 ug/dL (ref 148–442)

## 2024-08-22 LAB — FERRITIN: Ferritin: 34 ng/mL (ref 11–307)

## 2024-08-24 ENCOUNTER — Other Ambulatory Visit: Payer: Self-pay

## 2024-08-24 ENCOUNTER — Emergency Department (HOSPITAL_COMMUNITY)

## 2024-08-24 ENCOUNTER — Emergency Department (HOSPITAL_COMMUNITY)
Admission: EM | Admit: 2024-08-24 | Discharge: 2024-08-24 | Disposition: A | Discharge status: Home / Self Care | Attending: Emergency Medicine | Admitting: Emergency Medicine

## 2024-08-24 ENCOUNTER — Encounter (HOSPITAL_COMMUNITY): Payer: Self-pay

## 2024-08-24 ENCOUNTER — Inpatient Hospital Stay (HOSPITAL_BASED_OUTPATIENT_CLINIC_OR_DEPARTMENT_OTHER): Admitting: Hematology

## 2024-08-24 DIAGNOSIS — I517 Cardiomegaly: Secondary | ICD-10-CM | POA: Diagnosis not present

## 2024-08-24 DIAGNOSIS — Z7982 Long term (current) use of aspirin: Secondary | ICD-10-CM | POA: Insufficient documentation

## 2024-08-24 DIAGNOSIS — R0602 Shortness of breath: Secondary | ICD-10-CM | POA: Diagnosis not present

## 2024-08-24 DIAGNOSIS — R9389 Abnormal findings on diagnostic imaging of other specified body structures: Secondary | ICD-10-CM | POA: Diagnosis not present

## 2024-08-24 DIAGNOSIS — R079 Chest pain, unspecified: Secondary | ICD-10-CM

## 2024-08-24 DIAGNOSIS — R072 Precordial pain: Secondary | ICD-10-CM | POA: Insufficient documentation

## 2024-08-24 DIAGNOSIS — R0789 Other chest pain: Secondary | ICD-10-CM | POA: Diagnosis not present

## 2024-08-24 DIAGNOSIS — J9811 Atelectasis: Secondary | ICD-10-CM | POA: Diagnosis not present

## 2024-08-24 DIAGNOSIS — D500 Iron deficiency anemia secondary to blood loss (chronic): Secondary | ICD-10-CM

## 2024-08-24 LAB — HEPATIC FUNCTION PANEL
ALT: 14 U/L (ref 0–44)
AST: 21 U/L (ref 15–41)
Albumin: 3.2 g/dL — ABNORMAL LOW (ref 3.5–5.0)
Alkaline Phosphatase: 89 U/L (ref 38–126)
Bilirubin, Direct: 0.2 mg/dL (ref 0.0–0.2)
Indirect Bilirubin: 0.6 mg/dL (ref 0.3–0.9)
Total Bilirubin: 0.8 mg/dL (ref 0.0–1.2)
Total Protein: 7.6 g/dL (ref 6.5–8.1)

## 2024-08-24 LAB — BASIC METABOLIC PANEL WITH GFR
Anion gap: 10 (ref 5–15)
BUN: 13 mg/dL (ref 6–20)
CO2: 25 mmol/L (ref 22–32)
Calcium: 8.8 mg/dL — ABNORMAL LOW (ref 8.9–10.3)
Chloride: 107 mmol/L (ref 98–111)
Creatinine, Ser: 0.87 mg/dL (ref 0.44–1.00)
GFR, Estimated: >60 mL/min (ref >60)
Glucose, Bld: 98 mg/dL (ref 70–99)
Potassium: 4.5 mmol/L (ref 3.5–5.1)
Sodium: 142 mmol/L (ref 135–145)

## 2024-08-24 LAB — CBC
HCT: 35.6 % — ABNORMAL LOW (ref 36.0–46.0)
Hemoglobin: 11 g/dL — ABNORMAL LOW (ref 12.0–15.0)
MCH: 25.3 pg — ABNORMAL LOW (ref 26.0–34.0)
MCHC: 30.9 g/dL (ref 30.0–36.0)
MCV: 82 fL (ref 80.0–100.0)
Platelets: 270 K/uL (ref 150–400)
RBC: 4.34 MIL/uL (ref 3.87–5.11)
RDW: 23.2 % — ABNORMAL HIGH (ref 11.5–15.5)
WBC: 5.7 K/uL (ref 4.0–10.5)
nRBC: 0 % (ref 0.0–0.2)

## 2024-08-24 LAB — BRAIN NATRIURETIC PEPTIDE: B Natriuretic Peptide: 20 pg/mL (ref 0.0–100.0)

## 2024-08-24 LAB — TROPONIN I (HIGH SENSITIVITY)
Troponin I (High Sensitivity): <2 ng/L (ref <18)
Troponin I (High Sensitivity): <2 ng/L (ref <18)

## 2024-08-24 MED ORDER — IOHEXOL 350 MG/ML SOLN
75.0000 mL | Freq: Once | INTRAVENOUS | Status: AC | PRN
  Administered 2024-08-24: 75 mL via INTRAVENOUS

## 2024-08-24 NOTE — Assessment & Plan Note (Signed)
-  Chronic iron  deficiency anemia, worsening over the past year with hemoglobin levels dropping to 7.7 and 8.3, likely due to gastrointestinal bleeding from hide hernia,  gastric ulcer or erosive gastritis which showed an EGD in April 2025. Oral iron  supplementation has been insufficient.  -she received first dose Venofer  200mg  on 07/20/2024, developed severe nausea and vomiting  - Will change to IV Feraheme with premedications

## 2024-08-24 NOTE — ED Triage Notes (Signed)
 Complaining of center chest pain that started around 3:30. Went to the clinic at [place] and was sent here via ems. BP 103/47 Hr 73 O2 98 RA Said the chest pain radiates to the left and right.

## 2024-08-24 NOTE — ED Provider Notes (Signed)
 [place] EMERGENCY DEPARTMENT AT [facility] Provider Note   CSN: [id number] Arrival date & time: 08/24/24  1707     Patient presents with: Chest Pain   Terri Wiley is a 48 y.o. female.   Patient is a 48 year old female who presents to Emergency Department with a chief complaint of substernal chest pain and shortness of breath which has been ongoing for months for the past 2 hours.  She was evaluated in urgent care and sent to the emergency department for further evaluation.  She denies any known history of cardiac or pulmonary disease but notes that she does have a family history of this.  She denies any abdominal pain, nausea, vomiting, diarrhea.  The symptoms are not been exertional or positional in nature.  She denies any lower extremity edema.  She denies any recent cold lung symptoms to include cough, congestion, rhinorrhea, sore throat.  She denies any headache, dizziness, lightheadedness or syncope.   Chest Pain      Prior to Admission medications   Medication Sig Start Date End Date Taking? Authorizing Provider  acetaminophen  (TYLENOL ) 325 MG tablet Take 650 mg by mouth as needed. pain    [provider]  albuterol  (VENTOLIN  HFA) 108 (90 Base) MCG/ACT inhaler Inhale 2 puffs into the lungs every 6 (six) hours as needed for wheezing or shortness of breath. 06/24/24   Levora Reyes SAUNDERS, MD  aspirin  81 MG EC tablet Take 81 mg by mouth daily. Swallow whole.    [provider]  benzonatate  (TESSALON ) 100 MG capsule Take 1 capsule (100 mg total) by mouth 3 (three) times daily as needed for cough. 04/12/24   Alvia Corean CROME, FNP  FEROSUL 325 (65 Fe) MG tablet Take 325 mg by mouth daily. 12/02/20   [provider]  fluticasone  (FLONASE ) 50 MCG/ACT nasal spray Place 1-2 sprays into both nostrils daily. 06/24/24   Levora Reyes SAUNDERS, MD  furosemide  (LASIX ) 40 MG tablet Take 1 tablet (40 mg total) by mouth daily as needed for fluid or edema. 06/26/23    Levora Reyes SAUNDERS, MD  levocetirizine (XYZAL ) 5 MG tablet Take 1 tablet (5 mg total) by mouth every evening. 04/12/24   Alvia Corean CROME, FNP  meloxicam  (MOBIC ) 7.5 MG tablet Take 1 tablet (7.5 mg total) by mouth daily. Patient taking differently: Take 7.5 mg by mouth as needed. 07/23/22   Levora Reyes SAUNDERS, MD  naproxen sodium (ALEVE) 220 MG tablet Take 220 mg by mouth as needed.    [provider]  RABEprazole  (ACIPHEX ) 20 MG tablet TAKE 1 TABLET BY MOUTH TWICE DAILY 04/07/24   Mansouraty, Gabriel Jr., MD  sucralfate  (CARAFATE ) 1 g tablet Take 1 tablet (1 g total) by mouth 2 (two) times daily. Crush and mix with 10 ml water to make slurry 06/24/24 07/24/24  Mansouraty, Aloha Raddle., MD  Throat Lozenges (RA VITAMIN C COUGH DROPS MT) Use as directed 2 lozenges in the mouth or throat daily.    [provider]    Allergies: Bee venom, Penicillins, Hydrocodone, and Levaquin  [levofloxacin ]    Review of Systems  Cardiovascular:  Positive for chest pain.  All other systems reviewed and are negative.   Updated Vital Signs BP (!) 140/80 (BP Location: Right Arm)   Pulse 60   Temp 98.6 F (37 C) (Oral)   Resp 18   Ht 5' 9 (1.753 m)   Wt 132.9 kg   SpO2 97%   BMI 43.27 kg/m   Physical Exam  Vitals and nursing note reviewed.  Constitutional:      Appearance: Normal appearance.  HENT:     Head: Normocephalic and atraumatic.     Nose: Nose normal.     Mouth/Throat:     Mouth: Mucous membranes are moist.  Eyes:     Extraocular Movements: Extraocular movements intact.     Conjunctiva/sclera: Conjunctivae normal.     Pupils: Pupils are equal, round, and reactive to light.  Cardiovascular:     Rate and Rhythm: Normal rate and regular rhythm.     Pulses: Normal pulses.     Heart sounds: Normal heart sounds. Heart sounds not distant. No murmur heard. Pulmonary:     Effort: Pulmonary effort is normal. No tachypnea or respiratory distress.     Breath sounds: Normal breath  sounds. No stridor. No decreased breath sounds, wheezing, rhonchi or rales.  Chest:     Chest wall: No tenderness or edema.  Abdominal:     General: Abdomen is flat. Bowel sounds are normal.     Palpations: Abdomen is soft. There is no mass.     Tenderness: There is no abdominal tenderness.  Musculoskeletal:        General: Normal range of motion.     Cervical back: Normal range of motion and neck supple.  Skin:    General: Skin is warm and dry.  Neurological:     General: No focal deficit present.     Mental Status: She is alert and oriented to person, place, and time. Mental status is at baseline.  Psychiatric:        Mood and Affect: Mood normal.        Behavior: Behavior normal.        Thought Content: Thought content normal.        Judgment: Judgment normal.     (all labs ordered are listed, but only abnormal results are displayed) Labs Reviewed  CBC - Abnormal; Notable for the following components:      Result Value   Hemoglobin 11.0 (*)    HCT 35.6 (*)    MCH 25.3 (*)    RDW 23.2 (*)    All other components within normal limits  BASIC METABOLIC PANEL WITH GFR  HCG, SERUM, QUALITATIVE  BRAIN NATRIURETIC PEPTIDE  HEPATIC FUNCTION PANEL  POC URINE PREG, ED  TROPONIN I (HIGH SENSITIVITY)    EKG: None  Radiology: No results found.   Procedures   Medications Ordered in the ED - No data to display                                  Medical Decision Making Patient is doing well at this time and does remain stable.  Discussed with patient that we will obtain labs and imaging.  CTA pulmonary embolus was obtained to further evaluate for possible PE.  Patient is on 2 L nasal cannula at this point but this is for comfort as she was not hypoxic on room air.  Initial blood work has overall been unremarkable and EKG had no ischemic changes.  Troponin was within normal limits but will obtain serial troponin.  Will sign patient out to [employer], PA-C pending final  results and dispo.  Chest x-ray demonstrated no indication for pneumonia, pneumothorax, hemothorax.  Symptoms were not positional in nature and low suspicion for pericarditis or myocarditis.  Amount and/or Complexity of Data Reviewed Labs: ordered. Radiology: ordered.  Risk Prescription drug management.        Final diagnoses:  None    ED Discharge Orders     None          Daralene Lonni JONETTA DEVONNA 08/24/24 1904    Suzette Pac, MD 08/24/24 2330

## 2024-08-24 NOTE — Discharge Instructions (Signed)
 Someone from your cardiologist office should be contacting you to arrange follow-up appointment.  Please return to the emergency department for any new or worsening symptoms.

## 2024-08-24 NOTE — ED Provider Notes (Signed)
   Patient signed out to me by Lonni Conger, PA-C pending completion of workup.   Patient here with complaints of left-sided chest pain that began around 330 this evening.  Pain was also associated with some shortness of breath.  She was initially seen at clinic and med and and was sent here via EMS  See previous provider note for complete H&P   On my exam, patient describes having some left-sided chest pain that radiated into her left axilla and around to her shoulder.  She states that she has a hiatal hernia and feels that her chest pain was exacerbated by this.  She had PE study that was negative for evidence of PE and had reassuring serial troponins.  And chest x-ray showed no evidence of acute finding Her vital signs are reassuring and she is feeling better.  She has seen cardiology before, an amatory referral was placed for cardiology for close outpatient follow-up.  She was given return precautions   Herlinda Milling, PA-C 08/24/24 2202    Suzette Pac, MD 08/24/24 2330

## 2024-08-25 ENCOUNTER — Ambulatory Visit: Payer: Self-pay | Admitting: Nurse Practitioner

## 2024-08-26 NOTE — Telephone Encounter (Addendum)
 Called patient to relay results below as per Lacie Burton NP, patient voiced full understanding and had no further questions at this time.   ----- Message from Lacie K Burton sent at 08/25/2024 10:26 AM EDT ----- Please let pt know I reviewed her results. She only got 1 dose IV iron  infusion due to reaction, but her iron  deficiency and anemia have improved, although anemia has not completely resolved. Please  schedule lab/ov with Dr Willard in 6 weeks if she agrees.   Thanks Lacie NP ----- Message ----- From: Rebecka, Lab [facility] [place] Sent: 08/19/2024   3:28 PM EDT To: Onita Mattock, MD

## 2024-08-30 ENCOUNTER — Encounter: Admitting: Gastroenterology

## 2024-09-02 NOTE — Progress Notes (Signed)
 Cardiology Office Note    Date:  09/08/2024   ID:  Terri Wiley, DOB 15-Jul-1976, MRN [id number]   PCP:  Levora Reyes SAUNDERS, MD   [facility]  Cardiologist:  Maude Emmer, MD    History of Present Illness:  Terri Wiley is a 48 y.o. female with history of atypical chest pain seen by initially on 02/2021 2D echo normal LVEF 55 to 60% Lexiscan  Myoview  low risk study EF 58%. Seen again by PA 11/2021 had atypical pain after her 35 yo son died in 10-09-2021  Seen in ED 04/17/22 for atypical pain again  and r/o  ? Related to anxiety with more reflux having run out of protonix  and GI cocktail improved   Works as a Office manager at [employer] and walks a lot. Walks on a track twice a week-4-5 laps. Sometimes stops because she gets tired, not because of chest pain. . Sleeps propped up because she gets short of breath otherwise. Drinks a lot of coffee, sweet tea, gatorade, and V8 juices. Eats a lot of canned foods.   10/09/2022 has had pain behind left knee with some swelling that her lasix  helps with Aching pain not claudication Bilateral venous duplex 07/05/22 with no bakers cyst or DVT   Seen in ED 06/24/23 multiple complaints including right arm pain/swelling, abdominal discomfort, intermittent squeezing central chest pain.  Symptoms been ongoing for the past 4 days.  She denies any nausea or vomiting.  She will intermittently have squeezing central chest pain without clear provoking factor. Troponin negative CT abdomen with large hiatal hernia. Non obstructing left side renal stone US  with no right UE DVT ECG 06/26/23 SR non specific ST changes nothing acute   Suspect symptoms from hiatal hernia, GERD, reflux On protonix . Shared decision making favor more definitive evaluation of CAD with cardiac CTA. She should f/u with GI and ? General surgery to discuss Rx large hiatal hernia  Cardiac CTA done 07/20/23 calcium score 0 normal coronary arteries Echo 07/26/24 EF 55-60% normal RV  no valve dx Aorta 38 mm  She has been on lasix  as well complains of bilateral UE swelling No recent infection UE duplex 07/16/23 normal veins no thrombus or abnormal lymphadenopathy.   Seen in ED 08/24/24 atypical chest pain and dyspnea R/O no acute ECG changes and CTA negative for PE Again noted large hiatal hernia and intrathoracic stomach with chronic LLL atelectasis and elevate left hemidiaphragm  Patient is clear to have hernia surgery with Dr [name] Apparently she is getting iron  infusions and they want her Hct to be better before proceeding  Past Medical History:  Diagnosis Date   GERD (gastroesophageal reflux disease)    Hernia cerebri (HCC)    Papanicolaou smear of cervix with positive high risk human papilloma virus (HPV) test 06/24/2021   06/24/21 repeat pap in 1 year per ASCCP guidelines    Trichimoniasis 06/24/2021   Treated 06/24/21, POC___________    Past Surgical History:  Procedure Laterality Date   BREAST BIOPSY Left 06/2022   Fibroadenoma   HERNIA REPAIR     tubiligation      Current Medications: No outpatient medications have been marked as taking for the 09/08/24 encounter (Appointment) with Jacquelinne Speak C, MD.     Allergies:   Bee venom, Penicillins, Hydrocodone, and Levaquin  [levofloxacin ]   Social History   Socioeconomic History   Marital status: Divorced    Spouse name: Not on file   Number of  children: 5   Years of education: Not on file   Highest education level: Bachelor's degree (e.g., BA, AB, BS)  Occupational History   Not on file  Tobacco Use   Smoking status: Never    Passive exposure: Never   Smokeless tobacco: Never  Vaping Use   Vaping status: Never Used  Substance and Sexual Activity   Alcohol use: No   Drug use: No   Sexual activity: Yes    Birth control/protection: Surgical    Comment: tubal  Other Topics Concern   Not on file  Social History Narrative   Not on file   Social Drivers of Health   Financial Resource  Strain: Low Risk  (06/24/2023)   Overall Financial Resource Strain (CARDIA)    Difficulty of Paying Living Expenses: Not hard at all  Food Insecurity: No Food Insecurity (08/24/2024)   Received from [facility]   Hunger Vital Sign    Within the past 12 months, you worried that your food would run out before you got the money to buy more.: Never true    Within the past 12 months, the food you bought just didn't last and you didn't have money to get more.: Never true  Transportation Needs: No Transportation Needs (08/24/2024)   Received from [facility] - Transportation    Lack of Transportation (Medical): No    Lack of Transportation (Non-Medical): No  Physical Activity: Unknown (06/24/2023)   Exercise Vital Sign    Days of Exercise per Week: 0 days    Minutes of Exercise per Session: Not on file  Stress: No Stress Concern Present (06/24/2023)   [employer] of Occupational Health - Occupational Stress Questionnaire    Feeling of Stress : Not at all  Social Connections: Moderately Isolated (06/24/2023)   Social Connection and Isolation Panel    Frequency of Communication with Friends and Family: More than three times a week    Frequency of Social Gatherings with Friends and Family: Twice a week    Attends Religious Services: 1 to 4 times per year    Active Member of [employer] or Organizations: No    Attends Engineer, structural: Not on file    Marital Status: Divorced     Family History:  The patient's  family history includes Aneurysm in her father; Cancer in her maternal grandfather, paternal grandfather, and paternal grandmother; Diabetes in her mother; Heart failure in her son; Hypertension in her father and mother; Liver disease in her mother.   ROS:   Please see the history of present illness.    ROS All other systems reviewed and are negative.   PHYSICAL EXAM:   VS:  There were no vitals taken for this visit.  Affect appropriate Healthy:  appears  stated age HEENT: normal no obvious adenopathy  Neck supple with no adenopathy ? Cushingoid like hump on back  JVP normal no bruits no thyromegaly Lungs clear with no wheezing and good diaphragmatic motion Heart:  S1/S2 no murmur, no rub, gallop or click PMI normal Abdomen: benighn, BS positve, no tenderness, no AAA no bruit.  No HSM or HJR Distal pulses intact with no bruits Mild edema both UEls  Neuro non-focal Skin warm and dry No muscular weakness   Wt Readings from Last 3 Encounters:  08/24/24 293 lb (132.9 kg)  07/20/24 298 lb 9.6 oz (135.4 kg)  06/30/24 (!) 303 lb 1.6 oz (137.5 kg)      Studies/Labs Reviewed:  EKG:   04/18/22 SR low voltage non specific ST changes  Recent Labs: 03/14/2024: NT-Pro BNP 45 08/24/2024: ALT 14; B Natriuretic Peptide 20.0; BUN 13; Creatinine, Ser 0.87; Hemoglobin 11.0; Platelets 270; Potassium 4.5; Sodium 142   Lipid Panel    Component Value Date/Time   CHOL 151 02/27/2022 1025   TRIG 46.0 02/27/2022 1025   HDL 48.10 02/27/2022 1025   CHOLHDL 3 02/27/2022 1025   VLDL 9.2 02/27/2022 1025   LDLCALC 94 02/27/2022 1025    Additional studies/ records that were reviewed today include:  2D echo 3/18/2022IMPRESSIONS     1. Left ventricular ejection fraction, by estimation, is 55 to 60%. The  left ventricle has normal function. The left ventricle has no regional  wall motion abnormalities. Left ventricular diastolic parameters are  indeterminate.   2. Right ventricular systolic function is normal. The right ventricular  size is normal. There is normal pulmonary artery systolic pressure. The  estimated right ventricular systolic pressure is 19.5 mmHg.   3. A small pericardial effusion is present. The pericardial effusion is  posterior to the left ventricle.   4. The mitral valve is grossly normal. Trivial mitral valve  regurgitation.   5. The aortic valve is tricuspid. Aortic valve regurgitation is not  visualized.   6. The inferior  vena cava is normal in size with greater than 50%  respiratory variability, suggesting right atrial pressure of 3 mmHg.    Lexiscan  Myoview  03/15/2021 Treadmill attempted but patient unable to achieve adequate heart rate response and there was substantial lead motion artifact making interpretation impossible. Study therefore switched to Lexiscan  infusion. No diagnostic ST segment changes were noted. There were occasional to frequent PVCs noted in recovery including couplets. These are upright in the inferior leads suggesting possible outflow tract origin. No sustained arrhythmias. No significant myocardial perfusion defects to indicate scar or ischemia. This is a low risk study. Nuclear stress EF: 58%.      PLAN:  In order of problems listed above:  Atypical chest pain with low risk Lexiscan  Myoview  02/2021 normal LVEF on echo. Protonix  filled last visit  ? GERD/Reflux, large hiatal hernia causing symptoms improved back on protonix   Cardiac CTA with caliucm score 0 and normal coronary arteries 07/20/23. CTA with no PE 08/24/24.  Not clear why patient keeps getting sent back to cardiology. Her symptoms are being caused by a large hiatal hernia and she should f/u with her primary and consider general surgical evaluation.   HTN- driven by stress and high salt diet improved continue diuretic  Suspected sleep apnea-order sleep study per primary   Obestiy-mediterranean diet and exercise discussed  Hiatal Hernia:  large on CT per primary/GI consider general surgery consult Protonix  Clear to have surgery with Dr Tanda in regard to cardiac clearance   Edema:  UE;s ? Lymphedema No history of thrombus, central iv, breast cancer. F/U primary for mammogram UE US  with normal venous anatomy no thrombus or abnormal lymphadenopathy 07/16/23   Anemia:  f/u hematology Hct 27.1-> 35.6 with iron  infusions.     F/U PRN not on regular basis only for new issues    Signed, Maude Emmer, MD  09/08/2024 3:49 PM     [facility] [address] [place], [place], [place]  [address] Phone: [phone]; Fax: [phone]

## 2024-09-06 NOTE — Progress Notes (Signed)
 Called pt, no answers. I left a VM for her to call back.  Terri Wiley

## 2024-09-08 ENCOUNTER — Ambulatory Visit: Attending: Cardiovascular Disease | Admitting: Cardiovascular Disease

## 2024-09-08 VITALS — BP 130/81 | HR 67 | Wt 298.4 lb

## 2024-09-08 DIAGNOSIS — R079 Chest pain, unspecified: Secondary | ICD-10-CM

## 2024-09-08 DIAGNOSIS — I517 Cardiomegaly: Secondary | ICD-10-CM

## 2024-09-08 DIAGNOSIS — Z01810 Encounter for preprocedural cardiovascular examination: Secondary | ICD-10-CM | POA: Diagnosis not present

## 2024-09-08 DIAGNOSIS — I10 Essential (primary) hypertension: Secondary | ICD-10-CM

## 2024-09-08 DIAGNOSIS — K449 Diaphragmatic hernia without obstruction or gangrene: Secondary | ICD-10-CM

## 2024-09-08 NOTE — Patient Instructions (Signed)
Medication Instructions:  Your physician recommends that you continue on your current medications as directed. Please refer to the Current Medication list given to you today.   Labwork: None today  Testing/Procedures: None today  Follow-Up: 1 year Dr.Nishan  Any Other Special Instructions Will Be Listed Below (If Applicable).  If you need a refill on your cardiac medications before your next appointment, please call your pharmacy.  

## 2024-09-16 ENCOUNTER — Telehealth: Payer: Self-pay | Admitting: Nurse Practitioner

## 2024-09-16 NOTE — Telephone Encounter (Signed)
Called to reschedule appt

## 2024-10-02 NOTE — Progress Notes (Unsigned)
 [facility]   Telephone:([phone] Fax:(336) [phone]    Patient Care Team: Levora Reyes SAUNDERS, MD as PCP - General (Family Medicine) Delford Maude BROCKS, MD as PCP - Cardiology (Cardiology) Levora Reyes SAUNDERS, MD as Consulting Physician (Family Medicine)   CHIEF COMPLAINT: Follow up IDA  CURRENT THERAPY: Oral iron , IV Iron  PRN  INTERVAL HISTORY Ms. Krikorian returns for follow up, last seen by Dr. Lanny 06/30/24. She received 1 gram IV iron  (Venofer  x5).   ROS   Past Medical History:  Diagnosis Date   GERD (gastroesophageal reflux disease)    Hernia cerebri (HCC)    Papanicolaou smear of cervix with positive high risk human papilloma virus (HPV) test 06/24/2021   06/24/21 repeat pap in 1 year per ASCCP guidelines    Trichimoniasis 06/24/2021   Treated 06/24/21, POC___________     Past Surgical History:  Procedure Laterality Date   BREAST BIOPSY Left 06/2022   Fibroadenoma   HERNIA REPAIR     tubiligation       Outpatient Encounter Medications as of 10/03/2024  Medication Sig   acetaminophen  (TYLENOL ) 325 MG tablet Take 650 mg by mouth as needed. pain   albuterol  (VENTOLIN  HFA) 108 (90 Base) MCG/ACT inhaler Inhale 2 puffs into the lungs every 6 (six) hours as needed for wheezing or shortness of breath.   aspirin  81 MG EC tablet Take 81 mg by mouth daily. Swallow whole.   benzonatate  (TESSALON ) 100 MG capsule Take 1 capsule (100 mg total) by mouth 3 (three) times daily as needed for cough.   FEROSUL 325 (65 Fe) MG tablet Take 325 mg by mouth daily.   fluticasone  (FLONASE ) 50 MCG/ACT nasal spray Place 1-2 sprays into both nostrils daily.   furosemide  (LASIX ) 40 MG tablet Take 1 tablet (40 mg total) by mouth daily as needed for fluid or edema.   levocetirizine (XYZAL ) 5 MG tablet Take 1 tablet (5 mg total) by mouth every evening.   meloxicam  (MOBIC ) 7.5 MG tablet Take 1 tablet (7.5 mg total) by mouth daily. (Patient taking differently: Take 7.5 mg by mouth as needed.)    naproxen sodium (ALEVE) 220 MG tablet Take 220 mg by mouth as needed.   RABEprazole  (ACIPHEX ) 20 MG tablet TAKE 1 TABLET BY MOUTH TWICE DAILY   sucralfate  (CARAFATE ) 1 g tablet Take 1 tablet (1 g total) by mouth 2 (two) times daily. Crush and mix with 10 ml water to make slurry   Throat Lozenges (RA VITAMIN C COUGH DROPS MT) Use as directed 2 lozenges in the mouth or throat daily.   No facility-administered encounter medications on file as of 10/03/2024.     There were no vitals filed for this visit. There is no height or weight on file to calculate BMI.   ECOG PERFORMANCE STATUS: {CHL ONC ECOG [id number]}  PHYSICAL EXAM GENERAL:alert, no distress and comfortable SKIN: no rash  EYES: sclera clear NECK: without mass LYMPH:  no palpable cervical or supraclavicular lymphadenopathy  LUNGS: clear with normal breathing effort HEART: regular rate & rhythm, no lower extremity edema ABDOMEN: abdomen soft, non-tender and normal bowel sounds NEURO: alert & oriented x 3 with fluent speech, no focal motor/sensory deficits Breast exam:  PAC without erythema    CBC    Latest Ref Rng & Units 08/24/2024    5:30 PM 08/19/2024    3:17 PM 07/06/2024    9:23 AM  CBC  WBC 4.0 - 10.5 K/uL 5.7  5.8  5.9   Hemoglobin 12.0 - 15.0 g/dL 88.9  89.3  8.4   Hematocrit 36.0 - 46.0 % 35.6  34.3  28.2   Platelets 150 - 400 K/uL 270  270  295       CMP     Latest Ref Rng & Units 08/24/2024    5:30 PM 06/23/2024    9:46 AM 06/14/2024    6:06 PM  CMP  Glucose 70 - 99 mg/dL 98  93  892   BUN 6 - 20 mg/dL 13  13  8    Creatinine 0.44 - 1.00 mg/dL 9.12  9.28  9.27   Sodium 135 - 145 mmol/L 142  141  141   Potassium 3.5 - 5.1 mmol/L 4.5  3.9  3.1   Chloride 98 - 111 mmol/L 107  107  108   CO2 22 - 32 mmol/L 25  27  24    Calcium 8.9 - 10.3 mg/dL 8.8  9.1  8.6   Total Protein 6.5 - 8.1 g/dL 7.6   7.3   Total Bilirubin 0.0 - 1.2 mg/dL 0.8   0.5   Alkaline Phos 38 - 126 U/L 89   92   AST 15 - 41 U/L 21    13   ALT 0 - 44 U/L 14   14       ASSESSMENT & PLAN:  Iron  deficiency anemia, 2/2 GIB from hiatal hernia, gastric ulcer, erosive gastritis, and NSAID/ASA -Chronic iron  deficiency anemia, worsening over the past year with hemoglobin levels dropping to 7.7 and 8.3 -EGD in April 2025. Oral iron  supplementation has been insufficient. - Recommended to hold NSAIDs, continue aspirin  PRN -S/p 1 gram IV iron  (Venofer ) in divided doses over five treatments    PLAN:  No orders of the defined types were placed in this encounter.     All questions were answered. The patient knows to call the clinic with any problems, questions or concerns. No barriers to learning were detected. I spent *** counseling the patient face to face. The total time spent in the appointment was *** and more than 50% was on counseling, review of test results, and coordination of care.   Alletta Mattos K Blakely Gluth, NP 10/02/2024 9:44 AM

## 2024-10-03 ENCOUNTER — Inpatient Hospital Stay: Attending: Hematology

## 2024-10-03 ENCOUNTER — Inpatient Hospital Stay (HOSPITAL_BASED_OUTPATIENT_CLINIC_OR_DEPARTMENT_OTHER): Admitting: Nurse Practitioner

## 2024-10-03 ENCOUNTER — Encounter: Payer: Self-pay | Admitting: Nurse Practitioner

## 2024-10-03 ENCOUNTER — Other Ambulatory Visit: Payer: Self-pay

## 2024-10-03 VITALS — BP 142/98 | HR 61 | Temp 97.7°F | Resp 17 | Wt 299.0 lb

## 2024-10-03 DIAGNOSIS — D500 Iron deficiency anemia secondary to blood loss (chronic): Secondary | ICD-10-CM

## 2024-10-03 DIAGNOSIS — D649 Anemia, unspecified: Secondary | ICD-10-CM

## 2024-10-03 DIAGNOSIS — D509 Iron deficiency anemia, unspecified: Secondary | ICD-10-CM | POA: Insufficient documentation

## 2024-10-03 LAB — CBC WITH DIFFERENTIAL/PLATELET
Abs Immature Granulocytes: 0.01 K/uL (ref 0.00–0.07)
Basophils Absolute: 0 K/uL (ref 0.0–0.1)
Basophils Relative: 0 %
Eosinophils Absolute: 0.1 K/uL (ref 0.0–0.5)
Eosinophils Relative: 2 %
HCT: 35.9 % — ABNORMAL LOW (ref 36.0–46.0)
Hemoglobin: 11.5 g/dL — ABNORMAL LOW (ref 12.0–15.0)
Immature Granulocytes: 0 %
Lymphocytes Relative: 34 %
Lymphs Abs: 1.7 K/uL (ref 0.7–4.0)
MCH: 26.7 pg (ref 26.0–34.0)
MCHC: 32 g/dL (ref 30.0–36.0)
MCV: 83.3 fL (ref 80.0–100.0)
Monocytes Absolute: 0.3 K/uL (ref 0.1–1.0)
Monocytes Relative: 6 %
Neutro Abs: 3 K/uL (ref 1.7–7.7)
Neutrophils Relative %: 58 %
Platelets: 231 K/uL (ref 150–400)
RBC: 4.31 MIL/uL (ref 3.87–5.11)
RDW: 17.5 % — ABNORMAL HIGH (ref 11.5–15.5)
WBC: 5.1 K/uL (ref 4.0–10.5)
nRBC: 0 % (ref 0.0–0.2)

## 2024-10-03 LAB — IRON AND IRON BINDING CAPACITY (CC-WL,HP ONLY)
Iron: 63 ug/dL (ref 28–170)
Saturation Ratios: 22 % (ref 10.4–31.8)
TIBC: 281 ug/dL (ref 250–450)
UIBC: 218 ug/dL (ref 148–442)

## 2024-10-03 LAB — FERRITIN: Ferritin: 46 ng/mL (ref 11–307)

## 2024-10-04 ENCOUNTER — Other Ambulatory Visit

## 2024-10-04 ENCOUNTER — Ambulatory Visit: Admitting: Nurse Practitioner

## 2024-10-05 ENCOUNTER — Other Ambulatory Visit: Payer: Self-pay

## 2024-10-05 DIAGNOSIS — R051 Acute cough: Secondary | ICD-10-CM

## 2024-10-05 DIAGNOSIS — K219 Gastro-esophageal reflux disease without esophagitis: Secondary | ICD-10-CM

## 2024-10-05 DIAGNOSIS — R062 Wheezing: Secondary | ICD-10-CM

## 2024-10-05 DIAGNOSIS — K449 Diaphragmatic hernia without obstruction or gangrene: Secondary | ICD-10-CM

## 2024-10-05 NOTE — Telephone Encounter (Signed)
 Patient has an upcoming appt, requested these refills prior to that date, please advise if this is acceptable, one medication shows historical provider the other is from [facility]

## 2024-10-06 ENCOUNTER — Telehealth: Payer: Self-pay

## 2024-10-06 NOTE — Telephone Encounter (Signed)
-----   Message from [facility] sent at 09/30/2024  4:58 PM EDT ----- Regarding: RE: update? EW, The patient was scheduled in September and canceled. I will put my team on here to see if she wants to move forward with scheduling/rescheduling her procedure. Thanks. GM  Sommer Spickard or covering RN, Please reach out to patient and set her up for EGD/colonoscopy as had been scheduled in September or if she has decided not to pursue that, please update this group. Thanks. GM ----- Message ----- From: Tanda Locus, MD Sent: 09/30/2024  10:58 AM EDT To: Reyes JONELLE Pines, MD; Onita Mattock, MD; Aloha # Subject: update?                                        Hi Gabe  I'm catching up after being out in September.  Is this lady scheduled for f/u EGD/colonoscopy? I just couldn't figure it out in looking in [employer].    I'm reaching out to some colleagues to get their opinion about hiatal hernia repair in her with a BMI 44.  Our normal cut-off is around 35-37 just because the risk of recurrence is so high but this lady if memory serves carries her weight in her hips/thighs.  As you know redo hiatal hernia surgery is not fun and higher risk.    Thanks Locus Locus M. Tanda, MD, FACS General, Bariatric, & Minimally Invasive Surgery [facility],  [facility]

## 2024-10-06 NOTE — Telephone Encounter (Signed)
 Left message on machine to call back

## 2024-10-07 MED ORDER — LEVOCETIRIZINE DIHYDROCHLORIDE 5 MG PO TABS: 5.0000 mg | ORAL_TABLET | Freq: Every evening | ORAL | 1 refills | Status: AC

## 2024-10-07 MED ORDER — FEROSUL 325 (65 FE) MG PO TABS: 325.0000 mg | ORAL_TABLET | Freq: Three times a day (TID) | ORAL | 1 refills | Status: AC

## 2024-10-07 NOTE — Telephone Encounter (Signed)
 Tried to call patient and left vm to return call

## 2024-10-07 NOTE — Telephone Encounter (Signed)
 Left message on machine to call back

## 2024-10-07 NOTE — Telephone Encounter (Signed)
 Recently seen by oncology on October 6.  Taking oral iron  3 times a day at that time.  Plan to continue oral iron  3 times daily.  Will refill ferosul at that dose.  Okay to continue Xyzal  same dose, refill ordered.  Should not need pantoprazole  filled if she is still taking the Aciphex  prescribed by gastroenterology in April.  Did not refill pantoprazole .  Please clarify if she is taking Aciphex  and if refill needed, that was previously prescribed by gastroenterology.

## 2024-10-10 NOTE — Telephone Encounter (Signed)
Unable to reach pt by phone will mail letter  

## 2024-10-20 ENCOUNTER — Ambulatory Visit (INDEPENDENT_AMBULATORY_CARE_PROVIDER_SITE_OTHER): Admitting: Family Medicine

## 2024-10-20 ENCOUNTER — Encounter: Payer: Self-pay | Admitting: Family Medicine

## 2024-10-20 VITALS — BP 118/78 | HR 76 | Temp 98.0°F | Wt 297.4 lb

## 2024-10-20 DIAGNOSIS — M25572 Pain in left ankle and joints of left foot: Secondary | ICD-10-CM | POA: Diagnosis not present

## 2024-10-20 DIAGNOSIS — E611 Iron deficiency: Secondary | ICD-10-CM | POA: Diagnosis not present

## 2024-10-20 DIAGNOSIS — K449 Diaphragmatic hernia without obstruction or gangrene: Secondary | ICD-10-CM

## 2024-10-20 DIAGNOSIS — M79605 Pain in left leg: Secondary | ICD-10-CM | POA: Diagnosis not present

## 2024-10-20 NOTE — Patient Instructions (Addendum)
 Please call gastroenterology as they have been trying to reach you.   I do recommend avoiding any NSAIDs like ibuprofen or Aleve with your stomach issue and anemia prior.  Tylenol  is okay to take if needed.   Based on the location of your discomfort at the ankle you could have an overuse injury irritation of the muscle tendons, but you are also sore over the bone and the inside of the ankle where there is rarely an inflamed or clot in a superficial blood vessel (not a deep vein blood clot).  Gentle range of motion and stretching throughout the day can be helpful, Biofreeze or other topical muscle rub is fine if that helps symptoms, as well as a ankle sleeve or wrap if that feels better.  If those do not help, topical Voltaren gel could be tried temporarily but avoid any Advil or Aleve pills.  Please have x-ray of the ankle at the imaging facility below and I will schedule an ultrasound to evaluate the inside of your leg.  Please let me know if there are questions and if symptoms are not improving in the next 2 weeks, follow-up and we can look into other causes.  Follow-up sooner if any new or worsening symptoms.  [facility] or xray: Walk in 8:30-4:30 during weekdays, no appointment needed [address]  [place], [place] [id number]

## 2024-10-20 NOTE — Progress Notes (Signed)
 Subjective:  Patient ID: Terri Wiley, female    DOB: [birth year]  Age: 48 y.o. MRN: [id number]  CC:  Chief Complaint  Patient presents with   Leg Pain    Left 2 wks    HPI Terri Wiley presents for   Left leg pain No fall, no injury, just sore with walking past 2 weeks, no fall, no injury. Inside. Some swelling at times. No redness but has felt warm.  No prior similar sx's.  No recent prolonged car travel or air travel, no recent calf pain, but some swelling.  Some increased activity at work - walking further.   Tx: ibuprofen, range of motion.    Hx of hiatal hernia, iron  deficiency, gastric ulcer. Hospital follow-up in June.  History of large hiatal hernia.  Prior reactive gastropathy and nonbleeding gastric ulcer in April.  She was taking Carafate  4 times daily in addition to Aciphex  at her June visit.  Hematology referral for iron  infusions.  Hematology note reviewed from October 6.  Hold NSAIDs, continue aspirin  as needed, status post 1 dose of IV Venofer  and had nausea vomiting during observation period and stopped.  Iron  deficiency had resolved and anemia nearly resolved with hemoglobin of 11.5 with normal MCV.  Was continued on oral iron  3 times daily, cleared to proceed with hiatal hernia repair from a hematology standpoint.  Still taking iron  tid. No melena/hematochezia.  Taking nsaid past few weeks with some leg pains.- once per day - advised to d/c.   Lab Results  Component Value Date   WBC 5.1 10/03/2024   HGB 11.5 (L) 10/03/2024   HCT 35.9 (L) 10/03/2024   MCV 83.3 10/03/2024   PLT 231 10/03/2024    Patient has been followed by gastroenterology, and general surgery, with consideration of hiatal hernia repair.  Plan for EGD/colonoscopy.  She had been scheduled in September and then canceled.  It appears gastroenterology has been trying to reach her but unable to reach by phone and letter was mailed.     History Patient Active Problem List   Diagnosis Date  Noted   Iron  deficiency anemia due to chronic blood loss 06/30/2024   Morbid obesity (HCC) 02/23/2024   Non-cardiac chest pain 02/23/2024   Advised about management of weight 02/23/2024   Bunion of great toe 07/09/2023   Folic acid deficiency (non anemic) 07/09/2023   Heart murmur 07/09/2023   Herniated lumbar intervertebral disc 07/09/2023   Lower back pain 07/09/2023   Morbid (severe) obesity due to excess calories (HCC) 07/09/2023   Scoliosis concern 07/09/2023   Gastroesophageal reflux disease 07/04/2022   Atypical chest pain 07/04/2022   Regurgitation of food 07/04/2022   Hiatal hernia 07/04/2022   Chronic constipation 07/04/2022   Colon cancer screening 07/04/2022   Calculus of gallbladder without cholecystitis without obstruction 07/04/2022   Dysfunctional gallbladder 07/04/2022   LLQ pain 07/11/2021   Hot flashes 07/11/2021   Papanicolaou smear of cervix with positive high risk human papilloma virus (HPV) test 06/24/2021   Trichomoniasis 06/24/2021   Menorrhagia with irregular cycle 06/19/2021   Fibroids 06/19/2021   Perimenopause 06/19/2021   Routine Papanicolaou smear 06/19/2021   Mass of left hip region 02/28/2021   Primary osteoarthritis of knees, bilateral 04/08/2018   Radicular pain of left lower extremity 04/08/2018   Past Medical History:  Diagnosis Date   GERD (gastroesophageal reflux disease)    Hernia cerebri (HCC)    Papanicolaou smear of cervix with positive high risk human papilloma virus (HPV) test  06/24/2021   06/24/21 repeat pap in 1 year per ASCCP guidelines    Trichimoniasis 06/24/2021   Treated 06/24/21, POC___________   Past Surgical History:  Procedure Laterality Date   BREAST BIOPSY Left 06/2022   Fibroadenoma   HERNIA REPAIR     tubiligation     Allergies  Allergen Reactions   Bee Venom Shortness Of Breath   Penicillins Hives   Hydrocodone Hives   Levaquin  [Levofloxacin ] Nausea And Vomiting and Rash   Prior to Admission medications    Medication Sig Start Date End Date Taking? Authorizing Provider  acetaminophen  (TYLENOL ) 325 MG tablet Take 650 mg by mouth as needed. pain   Yes [provider]  albuterol  (VENTOLIN  HFA) 108 (90 Base) MCG/ACT inhaler Inhale 2 puffs into the lungs every 6 (six) hours as needed for wheezing or shortness of breath. 06/24/24  Yes Levora Reyes SAUNDERS, MD  aspirin  81 MG EC tablet Take 81 mg by mouth daily. Swallow whole.   Yes [provider]  FEROSUL 325 (65 Fe) MG tablet Take 1 tablet (325 mg total) by mouth 3 (three) times daily with meals. 10/07/24  Yes Levora Reyes SAUNDERS, MD  fluticasone  (FLONASE ) 50 MCG/ACT nasal spray Place 1-2 sprays into both nostrils daily. 06/24/24  Yes Levora Reyes SAUNDERS, MD  furosemide  (LASIX ) 40 MG tablet Take 1 tablet (40 mg total) by mouth daily as needed for fluid or edema. 06/26/23  Yes Levora Reyes SAUNDERS, MD  levocetirizine (XYZAL ) 5 MG tablet Take 1 tablet (5 mg total) by mouth every evening. 10/07/24  Yes Levora Reyes SAUNDERS, MD  naproxen sodium (ALEVE) 220 MG tablet Take 220 mg by mouth as needed.   Yes [provider]  RABEprazole  (ACIPHEX ) 20 MG tablet TAKE 1 TABLET BY MOUTH TWICE DAILY 04/07/24  Yes Mansouraty, Gabriel Jr., MD  sucralfate  (CARAFATE ) 1 g tablet Take 1 tablet (1 g total) by mouth 2 (two) times daily. Crush and mix with 10 ml water to make slurry 06/24/24 10/20/24 Yes Mansouraty, Aloha Raddle., MD  Throat Lozenges (RA VITAMIN C COUGH DROPS MT) Use as directed 2 lozenges in the mouth or throat daily.   Yes [provider]  meloxicam  (MOBIC ) 7.5 MG tablet Take 1 tablet (7.5 mg total) by mouth daily. Patient not taking: Reported on 10/20/2024 07/23/22   Levora Reyes SAUNDERS, MD   Social History   Socioeconomic History   Marital status: Divorced    Spouse name: Not on file   Number of children: 5   Years of education: Not on file   Highest education level: Bachelor's degree (e.g., BA, AB, BS)  Occupational History   Not on  file  Tobacco Use   Smoking status: Never    Passive exposure: Never   Smokeless tobacco: Never  Vaping Use   Vaping status: Never Used  Substance and Sexual Activity   Alcohol use: No   Drug use: No   Sexual activity: Yes    Birth control/protection: Surgical    Comment: tubal  Other Topics Concern   Not on file  Social History Narrative   Not on file   Social Drivers of Health   Financial Resource Strain: Low Risk  (06/24/2023)   Overall Financial Resource Strain (CARDIA)    Difficulty of Paying Living Expenses: Not hard at all  Food Insecurity: No Food Insecurity (08/24/2024)   [facility] from [facility]   Hunger Vital Sign    Within the past 12 months, you worried that your food  would run out before you got the money to buy more.: Never true    Within the past 12 months, the food you bought just didn't last and you didn't have money to get more.: Never true  Transportation Needs: No Transportation Needs (08/24/2024)   Received from [facility] - Transportation    Lack of Transportation (Medical): No    Lack of Transportation (Non-Medical): No  Physical Activity: Unknown (06/24/2023)   Exercise Vital Sign    Days of Exercise per Week: 0 days    Minutes of Exercise per Session: Not on file  Stress: No Stress Concern Present (06/24/2023)   [employer] of Occupational Health - Occupational Stress Questionnaire    Feeling of Stress : Not at all  Social Connections: Moderately Isolated (06/24/2023)   Social Connection and Isolation Panel    Frequency of Communication with Friends and Family: More than three times a week    Frequency of Social Gatherings with Friends and Family: Twice a week    Attends Religious Services: 1 to 4 times per year    Active Member of [employer] or Organizations: No    Attends Engineer, structural: Not on file    Marital Status: Divorced  Intimate Partner Violence: Not At Risk (06/30/2024)   Humiliation, Afraid, Rape, and  Kick questionnaire    Fear of Current or Ex-Partner: No    Emotionally Abused: No    Physically Abused: No    Sexually Abused: No    Review of Systems   Objective:   Vitals:   10/20/24 1514  BP: 118/78  Pulse: 76  Temp: 98 F (36.7 C)  SpO2: 98%  Weight: 297 lb 6.4 oz (134.9 kg)     Physical Exam Vitals reviewed.  Constitutional:      General: She is not in acute distress.    Appearance: Normal appearance. She is well-developed.  HENT:     Head: Normocephalic and atraumatic.  Cardiovascular:     Rate and Rhythm: Normal rate.  Pulmonary:     Effort: Pulmonary effort is normal.  Musculoskeletal:     Comments: Left lower leg, calf nontender, negative Homans.  Discomfort over the distal medial lower leg and area of saphenous without erythema or significant warmth.  Possible faint soft tissue swelling in that area but trace pedal edema bilaterally.  Mild discomfort into the tibialis anterior tendon, and into the medial ankle along with medial malleolus without crepitus.  No soft tissue swelling of the ankle appreciated.  No bony tenderness of foot.  Intact range of motion of ankle.  Neurological:     Mental Status: She is alert and oriented to person, place, and time.  Psychiatric:        Mood and Affect: Mood normal.        Assessment & Plan:  Zarie Kosiba is a 48 y.o. female . Left leg pain - Plan: DG Ankle Complete Left, US  Venous Img Lower Unilateral Left (DVT) Acute left ankle pain - Plan: DG Ankle Complete Left  - Lower left leg, ankle pain past few weeks.  Various areas including into the medial lower leg, saphenous area but less likely tonsillitis.  I did order ultrasound but if symptoms are not improving could defer.  Check imaging of ankle with some bony tenderness, but suspect this is more of an overuse, muscular issue.  Symptomatic care discussed, avoid oral NSAIDs given gastrointestinal history, anemia history.  Potentially could use short course topical  Voltaren but start with other agents first.  Tylenol  over-the-counter discussed if needed.  RTC precautions if not improving next few weeks.   Hiatal hernia Iron  deficiency  - Iron  deficiency, anemia has improved, hematology note reviewed as above.  Gastroenterology has been trying to contact her, she plans to call them for follow-up  No orders of the defined types were placed in this encounter.  Patient Instructions  Please call gastroenterology as they have been trying to reach you.   I do recommend avoiding any NSAIDs like ibuprofen or Aleve with your stomach issue and anemia prior.  Tylenol  is okay to take if needed.   Based on the location of your discomfort at the ankle you could have an overuse injury irritation of the muscle tendons, but you are also sore over the bone and the inside of the ankle where there is rarely an inflamed or clot in a superficial blood vessel (not a deep vein blood clot).  Gentle range of motion and stretching throughout the day can be helpful, Biofreeze or other topical muscle rub is fine if that helps symptoms, as well as a ankle sleeve or wrap if that feels better.  If those do not help, topical Voltaren gel could be tried temporarily but avoid any Advil or Aleve pills.  Please have x-ray of the ankle at the imaging facility below and I will schedule an ultrasound to evaluate the inside of your leg.  Please let me know if there are questions and if symptoms are not improving in the next 2 weeks, follow-up and we can look into other causes.  Follow-up sooner if any new or worsening symptoms.  [facility] or xray: Walk in 8:30-4:30 during weekdays, no appointment needed [address]  [place], [place] [phone]       Signed,   Reyes Pines, MD [facility] 10/20/24 4:26 PM

## 2024-10-24 ENCOUNTER — Telehealth: Payer: Self-pay

## 2024-10-24 NOTE — Telephone Encounter (Signed)
-----   Message from [facility] sent at 10/24/2024  8:14 AM EDT ----- Regarding: RE: patient called 10/21/24 EW, Thanks again for this. Our team has been trying to reach her to schedule as documented in the chart. We have sent messages and called and sent a letter due inability for us  to reach her. I'll put my team on here again to see what is possible for procedures, which I agree should be updated prior to any interventions.  Archie Atilano,  Can you see what is possible with reaching her? Thanks. GM ----- Message ----- From: Tanda Locus, MD Sent: 10/24/2024   7:30 AM EDT To: Leita Corona; Reyes JONELLE Pines, MD; Lacie K # Subject: RE: patient called 10/21/24                    Pt has to have updated egd and colonoscopy prior to any potential hiatal hernia surgery.  Need to make sure the anemia/low hemoglobin is not coming from her colon  Her BMI of 44 makes her a fairly moderate risk of hiatal hernia recurrence (making any future hiatal hernia surgery higher risk and outcomes worse) which makes this not a straight forward problem  I'm polling other surgeons about how they would manage this given her BMI if we just bite the bullet.  Pt has declined bariatric surgery.    w ----- Message ----- From: Corona Leita Sent: 10/21/2024   3:13 PM EDT To: Locus Tanda, MD Subject: patient called 10/21/24                        Dr. Tanda,   Patient called stated that she was only interested in hiatal hernia repair and wanted to continue with healthy weight and wellness after surgery.   Thanks, Leita

## 2024-10-24 NOTE — Telephone Encounter (Signed)
 Left message on machine to call back

## 2024-10-25 NOTE — Telephone Encounter (Signed)
 Which I have first available for EGD/Colonoscopy is fine with me. Thanks. GM

## 2024-10-25 NOTE — Telephone Encounter (Signed)
 Dr Wilhelmenia I have not been able to reach the pt after several phone calls and letter mailed. If she calls back can she be done in the [facility]? Or [facility]?

## 2024-10-25 NOTE — Telephone Encounter (Signed)
 Noted

## 2024-10-25 NOTE — Telephone Encounter (Signed)
 Left message on machine to call back

## 2024-10-26 ENCOUNTER — Encounter: Payer: Self-pay | Admitting: Nurse Practitioner

## 2024-11-13 ENCOUNTER — Encounter: Payer: Self-pay | Admitting: Gastroenterology

## 2024-12-26 ENCOUNTER — Ambulatory Visit

## 2024-12-26 VITALS — Ht 69.0 in | Wt 296.4 lb

## 2024-12-26 DIAGNOSIS — K449 Diaphragmatic hernia without obstruction or gangrene: Secondary | ICD-10-CM

## 2024-12-26 DIAGNOSIS — D649 Anemia, unspecified: Secondary | ICD-10-CM

## 2024-12-26 MED ORDER — NA SULFATE-K SULFATE-MG SULF 17.5-3.13-1.6 GM/177ML PO SOLN: 1.0000 | Freq: Once | ORAL | 0 refills | Status: AC

## 2024-12-26 NOTE — Progress Notes (Signed)
 No egg or soy allergy known to patient  No issues known to pt with past sedation with any surgeries or procedures Patient denies ever being told they had issues or difficulty with intubation  No FH of Malignant Hyperthermia Pt is not on diet pills Pt is not on  home 02  Pt is not on blood thinners  Pt has intermittent  issues with constipation and takes metamucil at times; instruction to hold 1 week before procedure No A fib or A flutter Have any cardiac testing pending--No Pt can ambulate  Pt denies use of chewing tobacco Discussed diabetic I weight loss medication holds Discussed NSAID holds Checked BMI Pt instructed to use Singlecare.com or GoodRx for a price reduction on prep  Patient's chart reviewed by Norleen Schillings CNRA prior to previsit and patient appropriate for the [facility].  Pre visit completed and red dot placed by patient's name on their procedure day (on provider's schedule).

## 2025-01-02 ENCOUNTER — Telehealth: Payer: Self-pay

## 2025-01-02 NOTE — Telephone Encounter (Signed)
 The pt returned call and has been advised of the situation. She will await a call with final determination after 5 pm.

## 2025-01-02 NOTE — Telephone Encounter (Signed)
 Procedure denied per Harlene Forest- Case # [id number] An [employer] Medical Director is available to speak with you about this determination. You can reach a peer reviewer or obtain a copy of the criteria used for this determination by calling [phone].   Terri Wiley to call at 58 pm tonight for peer to peer.  Message left for pt to return call (she has been difficult to reach in the past)   Message also sent to My Chart

## 2025-01-03 ENCOUNTER — Ambulatory Visit: Admitting: Gastroenterology

## 2025-01-03 ENCOUNTER — Encounter: Payer: Self-pay | Admitting: Gastroenterology

## 2025-01-03 VITALS — BP 149/83 | HR 66 | Temp 97.5°F | Resp 19 | Ht 69.0 in | Wt 296.4 lb

## 2025-01-03 DIAGNOSIS — R1310 Dysphagia, unspecified: Secondary | ICD-10-CM | POA: Diagnosis not present

## 2025-01-03 DIAGNOSIS — Z1211 Encounter for screening for malignant neoplasm of colon: Secondary | ICD-10-CM

## 2025-01-03 DIAGNOSIS — Q403 Congenital malformation of stomach, unspecified: Secondary | ICD-10-CM

## 2025-01-03 DIAGNOSIS — K562 Volvulus: Secondary | ICD-10-CM

## 2025-01-03 DIAGNOSIS — D649 Anemia, unspecified: Secondary | ICD-10-CM

## 2025-01-03 DIAGNOSIS — K641 Second degree hemorrhoids: Secondary | ICD-10-CM | POA: Diagnosis not present

## 2025-01-03 DIAGNOSIS — K449 Diaphragmatic hernia without obstruction or gangrene: Secondary | ICD-10-CM

## 2025-01-03 MED ORDER — FLEET ENEMA RE ENEM
1.0000 | ENEMA | Freq: Once | RECTAL | Status: AC
  Administered 2025-01-03: 1 via RECTAL

## 2025-01-03 MED ORDER — SODIUM CHLORIDE 0.9 % IV SOLN: 500.0000 mL | INTRAVENOUS | Status: DC

## 2025-01-03 NOTE — Progress Notes (Signed)
 "  GASTROENTEROLOGY PROCEDURE H&P NOTE   Primary Care Physician: Levora Reyes SAUNDERS, MD  HPI: Terri Wiley is a 49 y.o. female who presents for EGD/Colonoscopy for evaluation of GERD, HH, Dysphagia, Colon Cancer Screening.  Past Medical History:  Diagnosis Date   GERD (gastroesophageal reflux disease)    Hernia cerebri (HCC)    Papanicolaou smear of cervix with positive high risk human papilloma virus (HPV) test 06/24/2021   06/24/21 repeat pap in 1 year per [facility] guidelines    Trichimoniasis 06/24/2021   Treated 06/24/21, POC___________   Past Surgical History:  Procedure Laterality Date   BREAST BIOPSY Left 06/2022   Fibroadenoma   HERNIA REPAIR     tubiligation     Current Outpatient Medications  Medication Sig Dispense Refill   Na Sulfate-K Sulfate-Mg Sulfate concentrate (SUPREP) 17.5-3.13-1.6 GM/177ML SOLN SMARTSIG:1 By Mouth Once     acetaminophen  (TYLENOL ) 325 MG tablet Take 650 mg by mouth as needed. pain     albuterol  (VENTOLIN  HFA) 108 (90 Base) MCG/ACT inhaler Inhale 2 puffs into the lungs every 6 (six) hours as needed for wheezing or shortness of breath. 18 g 1   aspirin  81 MG EC tablet Take 81 mg by mouth daily. Swallow whole.     cetirizine  (ZYRTEC ) 10 MG tablet Take 10 mg by mouth daily.     cyclobenzaprine  (FLEXERIL ) 10 MG tablet Take 10 mg by mouth 3 (three) times daily as needed.     FEROSUL 325 (65 Fe) MG tablet Take 1 tablet (325 mg total) by mouth 3 (three) times daily with meals. 270 tablet 1   fluticasone  (FLONASE ) 50 MCG/ACT nasal spray Place 1-2 sprays into both nostrils daily. 16 g 6   furosemide  (LASIX ) 40 MG tablet Take 1 tablet (40 mg total) by mouth daily as needed for fluid or edema. 30 tablet 1   ibuprofen (ADVIL) 200 MG tablet Take 200 mg by mouth every 8 (eight) hours as needed.     levocetirizine (XYZAL ) 5 MG tablet Take 1 tablet (5 mg total) by mouth every evening. 90 tablet 1   meloxicam  (MOBIC ) 7.5 MG tablet Take 1 tablet (7.5 mg total) by mouth  daily. (Patient not taking: Reported on 10/20/2024) 30 tablet 0   naproxen sodium (ALEVE) 220 MG tablet Take 220 mg by mouth as needed.     pantoprazole  (PROTONIX ) 40 MG tablet Take 40 mg by mouth daily.     RABEprazole  (ACIPHEX ) 20 MG tablet TAKE 1 TABLET BY MOUTH TWICE DAILY 60 tablet 6   sucralfate  (CARAFATE ) 1 g tablet Take 1 tablet (1 g total) by mouth 2 (two) times daily. Crush and mix with 10 ml water to make slurry 60 tablet 0   Throat Lozenges (RA VITAMIN C COUGH DROPS MT) Use as directed 2 lozenges in the mouth or throat daily.     Current Facility-Administered Medications  Medication Dose Route Frequency Provider Last Rate Last Admin   0.9 %  sodium chloride  infusion  500 mL Intravenous Continuous Mansouraty, Aloha Raddle., MD       Current Medications[1] Allergies[2] Family History  Problem Relation Age of Onset   Liver disease Mother    Hypertension Mother    Diabetes Mother    Aneurysm Father    Hypertension Father    Cancer Maternal Grandfather        KNOWN CANCER   Cancer Paternal Grandmother        unknown type   Cancer Paternal Grandfather  unknown type   Crohn's disease Son    Heart failure Son    Colon cancer Neg Hx    Stomach cancer Neg Hx    Esophageal cancer Neg Hx    Inflammatory bowel disease Neg Hx    Pancreatic cancer Neg Hx    Rectal cancer Neg Hx    Social History   Socioeconomic History   Marital status: Divorced    Spouse name: Not on file   Number of children: 5   Years of education: Not on file   Highest education level: Bachelor's degree (e.g., BA, AB, BS)  Occupational History   Not on file  Tobacco Use   Smoking status: Never    Passive exposure: Never   Smokeless tobacco: Never  Vaping Use   Vaping status: Never Used  Substance and Sexual Activity   Alcohol use: No   Drug use: No   Sexual activity: Yes    Birth control/protection: Surgical    Comment: tubal  Other Topics Concern   Not on file  Social History Narrative    Not on file   Social Drivers of Health   Tobacco Use: Low Risk (12/26/2024)   Patient History    Smoking Tobacco Use: Never    Smokeless Tobacco Use: Never    Passive Exposure: Never  Financial Resource Strain: Low Risk (06/24/2023)   Overall Financial Resource Strain (CARDIA)    Difficulty of Paying Living Expenses: Not hard at all  Food Insecurity: No Food Insecurity (08/24/2024)   Received from [facility]   Epic    Within the past 12 months, you worried that your food would run out before you got the money to buy more.: Never true    Within the past 12 months, the food you bought just didn't last and you didn't have money to get more.: Never true  Transportation Needs: No Transportation Needs (08/24/2024)   Received from [facility]   PRAPARE - Transportation    Lack of Transportation (Medical): No    Lack of Transportation (Non-Medical): No  Physical Activity: Unknown (06/24/2023)   Exercise Vital Sign    Days of Exercise per Week: 0 days    Minutes of Exercise per Session: Not on file  Stress: No Stress Concern Present (06/24/2023)   [employer] of Occupational Health - Occupational Stress Questionnaire    Feeling of Stress : Not at all  Social Connections: Moderately Isolated (06/24/2023)   Social Connection and Isolation Panel    Frequency of Communication with Friends and Family: More than three times a week    Frequency of Social Gatherings with Friends and Family: Twice a week    Attends Religious Services: 1 to 4 times per year    Active Member of [employer] or Organizations: No    Attends Banker Meetings: Not on file    Marital Status: Divorced  Intimate Partner Violence: Not At Risk (06/30/2024)   Epic    Fear of Current or Ex-Partner: No    Emotionally Abused: No    Physically Abused: No    Sexually Abused: No  Depression (PHQ2-9): Low Risk (06/30/2024)   Depression (PHQ2-9)    PHQ-2 Score: 0  Alcohol Screen: Low Risk (06/24/2023)   Alcohol  Screen    Last Alcohol Screening Score (AUDIT): 0  Housing: Unknown (06/30/2024)   Epic    Unable to Pay for Housing in the Last Year: No    Number of Times Moved in the Last  Year: Not on file    Homeless in the Last Year: No  Utilities: Low Risk (08/24/2024)   Received from [facility]   Utilities    Within the past 12 months, have you been unable to get utilities(heat, electricity) when it was really needed?: No  Health Literacy: Not on file    Physical Exam: Today's Vitals   01/03/25 0736  BP: (!) 149/95  Pulse: 74  Temp: (!) 97.5 F (36.4 C)  TempSrc: Temporal  SpO2: 98%  Weight: 296 lb 6.4 oz (134.4 kg)  Height: 5' 9 (1.753 m)   Body mass index is 43.77 kg/m. GEN: NAD EYE: Sclerae anicteric ENT: MMM CV: Non-tachycardic GI: Soft, NT/ND NEURO:  Alert & Oriented x 3  Lab Results: No results for input(s): WBC, HGB, HCT, PLT in the last 72 hours. BMET No results for input(s): NA, K, CL, CO2, GLUCOSE, BUN, CREATININE, CALCIUM in the last 72 hours. LFT No results for input(s): PROT, ALBUMIN, AST, ALT, ALKPHOS, BILITOT, BILIDIR, IBILI in the last 72 hours. PT/INR No results for input(s): LABPROT, INR in the last 72 hours.   Impression / Plan: This is a 49 y.o.female who presents for EGD/Colonoscopy for evaluation of GERD, HH, Dysphagia, Colon Cancer Screening.  The risks and benefits of endoscopic evaluation/treatment were discussed with the patient and/or family; these include but are not limited to the risk of perforation, infection, bleeding, missed lesions, lack of diagnosis, severe illness requiring hospitalization, as well as anesthesia and sedation related illnesses.  The patient's history has been reviewed, patient examined, no change in status, and deemed stable for procedure.  The patient and/or family was provided an opportunity to ask questions and all were answered.  The patient and/or family is agreeable to  proceed.    Aloha Finner, MD [facility] Office # [phone]     [1]  Current Outpatient Medications:    Na Sulfate-K Sulfate-Mg Sulfate concentrate (SUPREP) 17.5-3.13-1.6 GM/177ML SOLN, SMARTSIG:1 By Mouth Once, Disp: , Rfl:    acetaminophen  (TYLENOL ) 325 MG tablet, Take 650 mg by mouth as needed. pain, Disp: , Rfl:    albuterol  (VENTOLIN  HFA) 108 (90 Base) MCG/ACT inhaler, Inhale 2 puffs into the lungs every 6 (six) hours as needed for wheezing or shortness of breath., Disp: 18 g, Rfl: 1   aspirin  81 MG EC tablet, Take 81 mg by mouth daily. Swallow whole., Disp: , Rfl:    cetirizine  (ZYRTEC ) 10 MG tablet, Take 10 mg by mouth daily., Disp: , Rfl:    cyclobenzaprine  (FLEXERIL ) 10 MG tablet, Take 10 mg by mouth 3 (three) times daily as needed., Disp: , Rfl:    FEROSUL 325 (65 Fe) MG tablet, Take 1 tablet (325 mg total) by mouth 3 (three) times daily with meals., Disp: 270 tablet, Rfl: 1   fluticasone  (FLONASE ) 50 MCG/ACT nasal spray, Place 1-2 sprays into both nostrils daily., Disp: 16 g, Rfl: 6   furosemide  (LASIX ) 40 MG tablet, Take 1 tablet (40 mg total) by mouth daily as needed for fluid or edema., Disp: 30 tablet, Rfl: 1   ibuprofen (ADVIL) 200 MG tablet, Take 200 mg by mouth every 8 (eight) hours as needed., Disp: , Rfl:    levocetirizine (XYZAL ) 5 MG tablet, Take 1 tablet (5 mg total) by mouth every evening., Disp: 90 tablet, Rfl: 1   meloxicam  (MOBIC ) 7.5 MG tablet, Take 1 tablet (7.5 mg total) by mouth daily. (Patient not taking: Reported on 10/20/2024), Disp: 30 tablet, Rfl:  0   naproxen sodium (ALEVE) 220 MG tablet, Take 220 mg by mouth as needed., Disp: , Rfl:    pantoprazole  (PROTONIX ) 40 MG tablet, Take 40 mg by mouth daily., Disp: , Rfl:    RABEprazole  (ACIPHEX ) 20 MG tablet, TAKE 1 TABLET BY MOUTH TWICE DAILY, Disp: 60 tablet, Rfl: 6   sucralfate  (CARAFATE ) 1 g tablet, Take 1 tablet (1 g total) by mouth 2 (two) times daily. Crush and  mix with 10 ml water to make slurry, Disp: 60 tablet, Rfl: 0   Throat Lozenges (RA VITAMIN C COUGH DROPS MT), Use as directed 2 lozenges in the mouth or throat daily., Disp: , Rfl:   Current Facility-Administered Medications:    0.9 %  sodium chloride  infusion, 500 mL, Intravenous, Continuous, Mansouraty, Aloha Raddle., MD [2]  Allergies Allergen Reactions   Bee Venom Shortness Of Breath   Penicillins Hives    Can take a Amoxicillin without a problem, but has broken out in hives with straight PCN   Hydrocodone Hives   Levaquin  [Levofloxacin ] Nausea And Vomiting and Rash   "

## 2025-01-03 NOTE — Patient Instructions (Addendum)
 Resume previous diet Continue present medications Await pathology results Repeat colonoscopy in 6-12 months with extended prep due to stool in colon Surgical consult for potential surgical treatment for hiatal hernia  Handouts/information given for Hiatal hernia and hemorrhoids  YOU HAD AN ENDOSCOPIC PROCEDURE TODAY AT THE [facility]:   Refer to the procedure report that was given to you for any specific questions about what was found during the examination.  If the procedure report does not answer your questions, please call your gastroenterologist to clarify.  If you requested that your care partner not be given the details of your procedure findings, then the procedure report has been included in a sealed envelope for you to review at your convenience later.  YOU SHOULD EXPECT: Some feelings of bloating in the abdomen. Passage of more gas than usual.  Walking can help get rid of the air that was put into your GI tract during the procedure and reduce the bloating. If you had a lower endoscopy (such as a colonoscopy or flexible sigmoidoscopy) you may notice spotting of blood in your stool or on the toilet paper. If you underwent a bowel prep for your procedure, you may not have a normal bowel movement for a few days.  Please Note:  You might notice some irritation and congestion in your nose or some drainage.  This is from the oxygen used during your procedure.  There is no need for concern and it should clear up in a day or so.  SYMPTOMS TO REPORT IMMEDIATELY:  Following lower endoscopy (colonoscopy):  Excessive amounts of blood in the stool  Significant tenderness or worsening of abdominal pains  Swelling of the abdomen that is new, acute  Fever of 100F or higher Following upper endoscopy (EGD)  Vomiting of blood or coffee ground material  New chest pain or pain under the shoulder blades  Painful or persistently difficult swallowing  New shortness of breath  Black,  tarry-looking stools For urgent or emergent issues, a gastroenterologist can be reached at any hour by calling [phone]. Do not use MyChart messaging for urgent concerns.   DIET:  We do recommend a small meal at first, but then you may proceed to your regular diet.  Drink plenty of fluids but you should avoid alcoholic beverages for 24 hours.  ACTIVITY:  You should plan to take it easy for the rest of today and you should NOT DRIVE or use heavy machinery until tomorrow (because of the sedation medicines used during the test).    FOLLOW UP: Our staff will call the number listed on your records the next business day following your procedure.  We will call around 7:15- 8:00 am to check on you and address any questions or concerns that you may have regarding the information given to you following your procedure. If we do not reach you, we will leave a message.     If any biopsies were taken you will be contacted by phone or by letter within the next 1-3 weeks.  Please call us  at [phone] if you have not heard about the biopsies in 3 weeks.   SIGNATURES/CONFIDENTIALITY: You and/or your care partner have signed paperwork which will be entered into your electronic medical record.  These signatures attest to the fact that that the information above on your After Visit Summary has been reviewed and is understood.  Full responsibility of the confidentiality of this discharge information lies with you and/or your care-partner.

## 2025-01-03 NOTE — Progress Notes (Signed)
 Transferred to PACU via stretcher. Patient arousing to stimulation.  VSS upon leaving procedure room.

## 2025-01-03 NOTE — Op Note (Signed)
 [facility] Patient Name: Terri Wiley Procedure Date: 01/03/2025 7:45 AM MRN: [id number] Endoscopist: Aloha Finner , MD, [id number] Age: 49 Referring MD:  Date of Birth: 1976/12/23 Gender: Female Account #: [id number] Procedure:                Colonoscopy Indications:              Screening for colorectal malignant neoplasm,                            Incidental anemia evaluation Medicines:                Monitored Anesthesia Care Procedure:                Pre-Anesthesia Assessment:                           - Prior to the procedure, a History and Physical                            was performed, and patient medications and                            allergies were reviewed. The patient's tolerance of                            previous anesthesia was also reviewed. The risks                            and benefits of the procedure and the sedation                            options and risks were discussed with the patient.                            All questions were answered, and informed consent                            was obtained. Prior Anticoagulants: The patient has                            taken no anticoagulant or antiplatelet agents. ASA                            Grade Assessment: III - A patient with severe                            systemic disease. After reviewing the risks and                            benefits, the patient was deemed in satisfactory                            condition to undergo the procedure.  After obtaining informed consent, the colonoscope                            was passed under direct vision. Throughout the                            procedure, the patient's blood pressure, pulse, and                            oxygen saturations were monitored continuously. The                            CF [id number] #[id number] was introduced through the anus                            and advanced to the the  cecum, identified by                            appendiceal orifice and ileocecal valve. The                            colonoscopy was somewhat difficult due to                            inadequate bowel prep and significant looping.                            Successful completion of the procedure was aided by                            changing the patient's position, using manual                            pressure, straightening and shortening the scope to                            obtain bowel loop reduction and using scope                            torsion. The patient tolerated the procedure. The                            quality of the bowel preparation was inadequate.                            The ileocecal valve, appendiceal orifice, and                            rectum were photographed. Scope In: 9:46:38 AM Scope Out: 9:57:18 AM Scope Withdrawal Time: 0 hours 6 minutes 55 seconds  Total Procedure Duration: 0 hours 10 minutes 40 seconds  Findings:                 The digital rectal exam findings include  hemorrhoids. Pertinent negatives include no                            palpable rectal lesions.                           Copious quantities of semi-liquid stool was found                            in the entire colon, interfering with                            visualization. Lavage of the area was performed                            using copious amounts, resulting in incomplete                            clearance with and inadequate visualization.                           Non-bleeding non-thrombosed internal hemorrhoids                            were found during retroflexion, during perianal                            exam and during digital exam. The hemorrhoids were                            Grade II (internal hemorrhoids that prolapse but                            reduce spontaneously). Complications:            No immediate  complications. Estimated Blood Loss:     Estimated blood loss: none. Impression:               - Preparation of the colon was inadequate even                            after copious lavage.                           - Hemorrhoids found on digital rectal exam.                           - Stool in the entire examined colon.                           - Non-bleeding non-thrombosed internal hemorrhoids.                           - No gross masses/lesions were noted on today's                            examination  but preparation can impair ability for                            complete evaluation. Recommendation:           - The patient will be observed post-procedure,                            until all discharge criteria are met.                           - Discharge patient to home.                           - Patient has a contact number available for                            emergencies. The signs and symptoms of potential                            delayed complications were discussed with the                            patient. Return to normal activities tomorrow.                            Written discharge instructions were provided to the                            patient.                           - Resume previous diet.                           - Continue present medications.                           - Repeat colonoscopy in next 6-12 months because                            the bowel preparation was poor/inadequate (2-day                            preparation + 1 week of daily Miralax will be                            required most likely).                           - The findings and recommendations were discussed                            with the patient.                           - The findings and recommendations  were discussed                            with the designated responsible adult. Aloha Finner, MD 01/03/2025 10:06:12 AM

## 2025-01-03 NOTE — Progress Notes (Signed)
 Called to room to assist during endoscopic procedure.  Patient ID and intended procedure confirmed with present staff. Received instructions for my participation in the procedure from the performing physician.

## 2025-01-03 NOTE — Op Note (Signed)
 [facility] Patient Name: Terri Wiley Procedure Date: 01/03/2025 7:46 AM MRN: [id number] Endoscopist: Aloha Finner , MD, [id number] Age: 49 Referring MD:  Date of Birth: February 23, 1976 Gender: Female Account #: [id number] Procedure:                Upper GI endoscopy Indications:              Anemia, Dysphagia, Heartburn, Follow-up of hiatal                            hernia Medicines:                Monitored Anesthesia Care Procedure:                Pre-Anesthesia Assessment:                           - Prior to the procedure, a History and Physical                            was performed, and patient medications and                            allergies were reviewed. The patient's tolerance of                            previous anesthesia was also reviewed. The risks                            and benefits of the procedure and the sedation                            options and risks were discussed with the patient.                            All questions were answered, and informed consent                            was obtained. Prior Anticoagulants: The patient has                            taken no anticoagulant or antiplatelet agents                            except for aspirin . ASA Grade Assessment: III - A                            patient with severe systemic disease. After                            reviewing the risks and benefits, the patient was                            deemed in satisfactory condition to undergo the  procedure.                           After obtaining informed consent, the endoscope was                            passed under direct vision. Throughout the                            procedure, the patient's blood pressure, pulse, and                            oxygen saturations were monitored continuously. The                            Olympus Scope [id number] was introduced through the                             mouth, and advanced to the second part of duodenum.                            The upper GI endoscopy was somewhat difficult due                            to abnormal anatomy and a J-shaped stomach which                            made pyloric intubation difficult. Successful                            completion of the procedure was aided by performing                            the maneuvers documented (below) in this report.                            The patient tolerated the procedure. Scope In: Scope Out: Findings:                 No gross lesions were noted in the entire                            esophagus. A guidewire was placed and the scope was                            withdrawn. Dilation was performed with a Savary                            dilator with no resistance at 18 mm. The dilation                            site was examined following endoscope reinsertion  and showed no change.                           The Z-line was irregular and was found 35 cm from                            the incisors.                           A 9 cm hiatal hernia was found. The proximal extent                            of the gastric folds (end of tubular esophagus) was                            35 cm from the incisors. The hiatal narrowing was                            44 cm from the incisors. The Z-line was a variable                            distance from incisors; the hiatal hernia was                            sliding and paraesophageal.                           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                           No gross mucosal lesions were noted in the entire                            examined stomach.                            No gross lesions were noted in the duodenal bulb,                            in the first portion of the duodenum and in the                            second portion of the duodenum. Biopsies were taken  with a cold forceps for histology. Complications:            No immediate complications. Estimated Blood Loss:     Estimated blood loss was minimal. Impression:               - No gross lesions in the entire esophagus. Dilated.                           - Z-line irregular, 35 cm from the incisors.                           - 9 cm hiatal hernia (sliding and paraesophageal).                           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                            Significant looping required to enter into the                            distal stomach/duodenum due to the anatomic                            deformity and the [facility]).                           - No gross mucosal lesions in the entire stomach.                           - No gross lesions in the duodenal bulb, in the                            first portion of the duodenum and in the second                            portion of the duodenum. Biopsied. Recommendation:           - Proceed to scheduled colonoscopy.                           - Continue present medications.                           - Await pathology results.                           - Followup with Surgery in regards to next steps in                            plans for potential surgical interventions to                            Hiatal hernia treatment. The hiatal hernia appears  larger and with the J-shaped stomach it seems that                            things have progressed from last year.                           - The findings and recommendations were discussed                            with the patient.                           - The findings and recommendations were discussed                             with the designated responsible adult. Aloha Finner, MD 01/03/2025 10:02:12 AM

## 2025-01-04 ENCOUNTER — Telehealth: Payer: Self-pay

## 2025-01-04 NOTE — Telephone Encounter (Signed)
" °  Follow up Call-     01/03/2025    7:32 AM 04/05/2024    2:58 PM  Call back number  Post procedure Call Back phone  # [phone]  Permission to leave phone message Yes Yes     Patient questions:  Do you have a fever, pain , or abdominal swelling? No. Pain Score  0 *  Have you tolerated food without any problems? Yes.    Have you been able to return to your normal activities? Yes.    Do you have any questions about your discharge instructions: Diet   No. Medications  No. Follow up visit  No.  Do you have questions or concerns about your Care? No.  Actions: * If pain score is 4 or above: No action needed, pain <4.   "

## 2025-01-05 LAB — SURGICAL PATHOLOGY

## 2025-01-06 ENCOUNTER — Ambulatory Visit: Payer: Self-pay | Admitting: Gastroenterology

## 2025-01-11 ENCOUNTER — Encounter: Payer: Self-pay | Admitting: Gastroenterology

## 2025-01-24 ENCOUNTER — Emergency Department (HOSPITAL_COMMUNITY)

## 2025-01-24 ENCOUNTER — Emergency Department (HOSPITAL_COMMUNITY)
Admission: EM | Admit: 2025-01-24 | Discharge: 2025-01-24 | Disposition: A | Discharge status: Home / Self Care | Attending: Emergency Medicine | Admitting: Emergency Medicine

## 2025-01-24 ENCOUNTER — Encounter (HOSPITAL_COMMUNITY): Payer: Self-pay | Admitting: *Deleted

## 2025-01-24 ENCOUNTER — Other Ambulatory Visit: Payer: Self-pay

## 2025-01-24 DIAGNOSIS — H66002 Acute suppurative otitis media without spontaneous rupture of ear drum, left ear: Secondary | ICD-10-CM | POA: Diagnosis not present

## 2025-01-24 DIAGNOSIS — E876 Hypokalemia: Secondary | ICD-10-CM | POA: Insufficient documentation

## 2025-01-24 DIAGNOSIS — J101 Influenza due to other identified influenza virus with other respiratory manifestations: Secondary | ICD-10-CM | POA: Diagnosis not present

## 2025-01-24 DIAGNOSIS — R1013 Epigastric pain: Secondary | ICD-10-CM | POA: Insufficient documentation

## 2025-01-24 DIAGNOSIS — Z7982 Long term (current) use of aspirin: Secondary | ICD-10-CM | POA: Diagnosis not present

## 2025-01-24 DIAGNOSIS — J111 Influenza due to unidentified influenza virus with other respiratory manifestations: Secondary | ICD-10-CM

## 2025-01-24 DIAGNOSIS — R059 Cough, unspecified: Secondary | ICD-10-CM | POA: Diagnosis present

## 2025-01-24 LAB — COMPREHENSIVE METABOLIC PANEL WITH GFR
ALT: 11 U/L (ref 0–44)
AST: 15 U/L (ref 15–41)
Albumin: 3.7 g/dL (ref 3.5–5.0)
Alkaline Phosphatase: 91 U/L (ref 38–126)
Anion gap: 13 (ref 5–15)
BUN: 7 mg/dL (ref 6–20)
CO2: 24 mmol/L (ref 22–32)
Calcium: 8.8 mg/dL — ABNORMAL LOW (ref 8.9–10.3)
Chloride: 108 mmol/L (ref 98–111)
Creatinine, Ser: 0.74 mg/dL (ref 0.44–1.00)
GFR, Estimated: >60 mL/min
Glucose, Bld: 95 mg/dL (ref 70–99)
Potassium: 3.2 mmol/L — ABNORMAL LOW (ref 3.5–5.1)
Sodium: 145 mmol/L (ref 135–145)
Total Bilirubin: 0.5 mg/dL (ref 0.0–1.2)
Total Protein: 7.8 g/dL (ref 6.5–8.1)

## 2025-01-24 LAB — CBC WITH DIFFERENTIAL/PLATELET
Abs Immature Granulocytes: 0 10*3/uL (ref 0.00–0.07)
Basophils Absolute: 0 10*3/uL (ref 0.0–0.1)
Basophils Relative: 0 %
Eosinophils Absolute: 0 10*3/uL (ref 0.0–0.5)
Eosinophils Relative: 0 %
HCT: 39.8 % (ref 36.0–46.0)
Hemoglobin: 13.1 g/dL (ref 12.0–15.0)
Immature Granulocytes: 0 %
Lymphocytes Relative: 30 %
Lymphs Abs: 1.4 10*3/uL (ref 0.7–4.0)
MCH: 29.1 pg (ref 26.0–34.0)
MCHC: 32.9 g/dL (ref 30.0–36.0)
MCV: 88.4 fL (ref 80.0–100.0)
Monocytes Absolute: 0.4 10*3/uL (ref 0.1–1.0)
Monocytes Relative: 8 %
Neutro Abs: 3 10*3/uL (ref 1.7–7.7)
Neutrophils Relative %: 62 %
Platelets: 230 10*3/uL (ref 150–400)
RBC: 4.5 MIL/uL (ref 3.87–5.11)
RDW: 13.4 % (ref 11.5–15.5)
WBC: 4.8 10*3/uL (ref 4.0–10.5)
nRBC: 0 % (ref 0.0–0.2)

## 2025-01-24 LAB — RESP PANEL BY RT-PCR (RSV, FLU A&B, COVID)  RVPGX2
Influenza A by PCR: POSITIVE — AB
Influenza B by PCR: NEGATIVE
Resp Syncytial Virus by PCR: NEGATIVE
SARS Coronavirus 2 by RT PCR: NEGATIVE

## 2025-01-24 LAB — LIPASE, BLOOD: Lipase: 11 U/L (ref 11–51)

## 2025-01-24 MED ORDER — AMOXICILLIN 500 MG PO CAPS: 500.0000 mg | ORAL_CAPSULE | Freq: Three times a day (TID) | ORAL | 0 refills | Status: AC

## 2025-01-24 MED ORDER — ONDANSETRON HCL 4 MG PO TABS: 4.0000 mg | ORAL_TABLET | Freq: Three times a day (TID) | ORAL | 0 refills | Status: AC | PRN

## 2025-01-24 MED ORDER — AMOXICILLIN 500 MG PO CAPS: 500.0000 mg | ORAL_CAPSULE | Freq: Three times a day (TID) | ORAL | 0 refills | Status: DC

## 2025-01-24 MED ORDER — ONDANSETRON HCL 4 MG PO TABS: 4.0000 mg | ORAL_TABLET | Freq: Three times a day (TID) | ORAL | 0 refills | Status: DC | PRN

## 2025-01-24 MED ORDER — POTASSIUM CHLORIDE CRYS ER 20 MEQ PO TBCR
40.0000 meq | EXTENDED_RELEASE_TABLET | Freq: Once | ORAL | Status: AC
  Administered 2025-01-24: 40 meq via ORAL
  Filled 2025-01-24: qty 2

## 2025-01-24 NOTE — Discharge Instructions (Signed)
 As discussed you have tested positive for influenza A.  You will need to rest and make sure you are drinking plenty of fluids to avoid dehydration.  Expect gradual improvement in your symptoms over this week, but get rechecked for any worsening symptoms including weakness or shortness of breath.  Your chest x-ray is negative for pneumonia.  You do have a left otitis media and you are being placed on amoxicillin  for treating this infection.  You may also take a nasal decongestant, either the fluticasone  spray that you have or Sudafed.

## 2025-01-24 NOTE — ED Triage Notes (Signed)
 Pt c/o generalized body aches with n/v x 4 days  Pt coughing up yellow/green sputum

## 2025-01-24 NOTE — ED Provider Notes (Signed)
 " Terri Wiley EMERGENCY DEPARTMENT AT [facility] Provider Note   CSN: [id number] Arrival date & time: 01/24/25  9072     Patient presents with: Generalized Body Aches   Terri Wiley is a 49 y.o. female with multiple complaints, mostly flu like in nature, describing nonproductive cough with sob with coughing episodes along with generalized body ache,  subjective fever, nausea with emesis, although predominantly triggered by cough spells.  Also reports nasal congestion with brown/green nasal secretions,  sinus pain and left ear pain. Her sx started 4 days ago. She also developed a small amount of diarrhea x1 today.  She endorses epigastric pain which radiates into her back and is concerned her hiatal hernia is acting up.  She denies reflux, burning pain, water brash.  She has a history of gallstones, but states the epigastric pain is not similar.    The history is provided by the patient.       Prior to Admission medications  Medication Sig Start Date End Date Taking? Authorizing Provider  acetaminophen  (TYLENOL ) 325 MG tablet Take 650 mg by mouth as needed. pain    [provider]  albuterol  (VENTOLIN  HFA) 108 (90 Base) MCG/ACT inhaler Inhale 2 puffs into the lungs every 6 (six) hours as needed for wheezing or shortness of breath. 06/24/24   Levora Reyes SAUNDERS, MD  amoxicillin  (AMOXIL ) 500 MG capsule Take 1 capsule (500 mg total) by mouth 3 (three) times daily. 01/24/25   Birdena Clarity, PA-C  aspirin  81 MG EC tablet Take 81 mg by mouth daily. Swallow whole.    [provider]  cetirizine  (ZYRTEC ) 10 MG tablet Take 10 mg by mouth daily. 08/31/21   [provider]  cyclobenzaprine  (FLEXERIL ) 10 MG tablet Take 10 mg by mouth 3 (three) times daily as needed. 08/28/21   [provider]  FEROSUL 325 (65 Fe) MG tablet Take 1 tablet (325 mg total) by mouth 3 (three) times daily with meals. 10/07/24   Levora Reyes SAUNDERS, MD  fluticasone  (FLONASE ) 50 MCG/ACT  nasal spray Place 1-2 sprays into both nostrils daily. 06/24/24   Levora Reyes SAUNDERS, MD  furosemide  (LASIX ) 40 MG tablet Take 1 tablet (40 mg total) by mouth daily as needed for fluid or edema. 06/26/23   Levora Reyes SAUNDERS, MD  ibuprofen (ADVIL) 200 MG tablet Take 200 mg by mouth every 8 (eight) hours as needed.    [provider]  levocetirizine (XYZAL ) 5 MG tablet Take 1 tablet (5 mg total) by mouth every evening. 10/07/24   Levora Reyes SAUNDERS, MD  naproxen sodium (ALEVE) 220 MG tablet Take 220 mg by mouth as needed.    [provider]  ondansetron  (ZOFRAN ) 4 MG tablet Take 1 tablet (4 mg total) by mouth every 8 (eight) hours as needed for nausea or vomiting. 01/24/25   Genowefa Morga, PA-C  pantoprazole  (PROTONIX ) 40 MG tablet Take 40 mg by mouth daily.    [provider]  sucralfate  (CARAFATE ) 1 g tablet Take 1 tablet (1 g total) by mouth 2 (two) times daily. Crush and mix with 10 ml water to make slurry 06/24/24 10/20/24  Mansouraty, Aloha Raddle., MD  Throat Lozenges (RA VITAMIN C COUGH DROPS MT) Use as directed 2 lozenges in the mouth or throat daily.    [provider]    Allergies: Bee venom, Penicillins, Hydrocodone, and Levaquin  [levofloxacin ]    Review of Systems  Constitutional:  Positive for fatigue and fever.  HENT:  Negative  for congestion and sore throat.   Eyes: Negative.   Respiratory:  Positive for cough and shortness of breath. Negative for chest tightness.   Cardiovascular:  Negative for chest pain.  Gastrointestinal:  Positive for abdominal pain, diarrhea, nausea and vomiting.  Genitourinary: Negative.   Musculoskeletal:  Positive for myalgias. Negative for arthralgias, joint swelling and neck pain.  Skin: Negative.  Negative for rash and wound.  Neurological:  Negative for dizziness, weakness, light-headedness, numbness and headaches.  Psychiatric/Behavioral: Negative.      Updated Vital Signs BP (!) 108/93   Pulse 76   Temp 98.5 F  (36.9 C) (Oral)   Resp 18   Ht 5' 9 (1.753 m)   Wt 134.4 kg   SpO2 98%   BMI 43.76 kg/m   Physical Exam Vitals and nursing note reviewed.  Constitutional:      Appearance: She is well-developed.  HENT:     Head: Normocephalic and atraumatic.     Right Ear: Tympanic membrane is bulging.     Left Ear: Tympanic membrane is erythematous and bulging.     Ears:     Comments: Loss of landmarks bilateral TM's,  left TM erythematous.    Nose: Congestion present.     Mouth/Throat:     Mouth: Mucous membranes are moist.     Pharynx: No oropharyngeal exudate or posterior oropharyngeal erythema.  Eyes:     Conjunctiva/sclera: Conjunctivae normal.  Cardiovascular:     Rate and Rhythm: Normal rate and regular rhythm.     Heart sounds: Normal heart sounds.  Pulmonary:     Effort: Pulmonary effort is normal.     Breath sounds: Normal breath sounds. No wheezing.  Abdominal:     General: Bowel sounds are normal.     Palpations: Abdomen is soft. There is no mass.     Tenderness: There is abdominal tenderness in the epigastric area. There is no guarding or rebound. Negative signs include Murphy's sign.     Hernia: No hernia is present.     Comments: No abdominal guarding, abdomen is soft, vague tenderness in the epigastric region, no guarding or rebound, no acute findings.  Musculoskeletal:        General: Normal range of motion.     Cervical back: Normal range of motion.  Skin:    General: Skin is warm and dry.  Neurological:     Mental Status: She is alert.     (all labs ordered are listed, but only abnormal results are displayed) Labs Reviewed  RESP PANEL BY RT-PCR (RSV, FLU A&B, COVID)  [id number] - Abnormal; Notable for the following components:      Result Value   Influenza A by PCR POSITIVE (*)    All other components within normal limits  COMPREHENSIVE METABOLIC PANEL WITH GFR - Abnormal; Notable for the following components:   Potassium 3.2 (*)    Calcium 8.8 (*)    All other  components within normal limits  CBC WITH DIFFERENTIAL/PLATELET  LIPASE, BLOOD    EKG: None  Radiology: DG Chest 2 View Result Date: 01/24/2025 EXAM: 2 VIEW(S) XRAY OF THE CHEST 01/24/2025 10:32:43 AM COMPARISON: 08/24/2024 CLINICAL HISTORY: 49 year old female with cough, shortness of breath, 4 days of bodyache, productive cough, nausea, and vomiting. FINDINGS: LUNGS AND PLEURA: Chronic left diaphragmatic hernia. Left basilar atelectasis. Small right pleural effusion. No pneumothorax. HEART AND MEDIASTINUM: Large chronic hiatal hernia. No acute abnormality of the cardiac and mediastinal silhouettes. BONES AND SOFT TISSUES: Degenerative changes of  spine. No acute osseous abnormality. Negative visible bowel gas. IMPRESSION: 1. New small right pleural effusion. No other acute pulmonary finding. 2. Large chronic hiatal hernia with elevated left hemidiaphragm. Electronically signed by: Helayne Hurst MD 01/24/2025 10:39 AM EST RP Workstation: [id number]     Procedures   Medications Ordered in the ED  potassium chloride  SA (KLOR-CON  M) CR tablet 40 mEq (40 mEq Oral Given 01/24/25 1228)                                    Medical Decision Making Patient presenting with flulike symptoms but also having nasal congestion, sinus pressure and left ear pain.  Differential diagnosis would include bacterial versus viral URI, in conjunction with lower respiratory symptoms as well raising concern for pneumonia versus bronchitis.  Denies sore throat, doubt strep infection, labs and imaging obtained, she is positive for influenza A, her exam also reveals a left otitis media.  She has had her symptoms for 4 days plus, Tamiflu will not be effective for this patient.  She was given home instructions for treatment of her symptoms, rest, fluids.  She is also placed on amoxicillin  given her left otitis findings.  Of note, although she is pen allergic, she states she has no problems with amoxicillin .  Return precautions  were outlined.  Amount and/or Complexity of Data Reviewed Labs: ordered.    Details: Labs reviewed, including c-Met, CBC, lipase and respiratory panel, all unremarkable except for being positive for influenza A.  She also has a small right pleural effusion, suspect inflammatory reaction to viral process. Radiology: ordered.    Details: Chest x-ray negative for acute cardiopulmonary findings.  She does have chronic large hiatal hernia.  Risk Prescription drug management.        Final diagnoses:  Influenza  Non-recurrent acute suppurative otitis media of left ear without spontaneous rupture of tympanic membrane    ED Discharge Orders          Ordered    amoxicillin  (AMOXIL ) 500 MG capsule  3 times daily,   Status:  Discontinued        01/24/25 1156    ondansetron  (ZOFRAN ) 4 MG tablet  Every 8 hours PRN,   Status:  Discontinued        01/24/25 1156    amoxicillin  (AMOXIL ) 500 MG capsule  3 times daily        01/24/25 1158    ondansetron  (ZOFRAN ) 4 MG tablet  Every 8 hours PRN        01/24/25 1158               Shiasia Porro, PA-C 01/24/25 1431  "

## 2025-02-06 ENCOUNTER — Inpatient Hospital Stay

## 2025-02-06 ENCOUNTER — Other Ambulatory Visit

## 2025-02-06 ENCOUNTER — Inpatient Hospital Stay: Admitting: Nurse Practitioner

## 2025-02-06 ENCOUNTER — Ambulatory Visit: Admitting: Nurse Practitioner

## 2025-02-09 ENCOUNTER — Ambulatory Visit: Admitting: Family Medicine
